# Patient Record
Sex: Female | Born: 1935 | Race: White | Hispanic: No | Marital: Married | Smoking: Never smoker
Health system: Southern US, Community
[De-identification: ages and names within clinical notes are randomized; demographics above are authoritative.]

## PROBLEM LIST (undated history)

## (undated) DIAGNOSIS — J189 Pneumonia, unspecified organism: Secondary | ICD-10-CM

## (undated) DIAGNOSIS — Z86718 Personal history of other venous thrombosis and embolism: Secondary | ICD-10-CM

## (undated) DIAGNOSIS — N390 Urinary tract infection, site not specified: Secondary | ICD-10-CM

## (undated) DIAGNOSIS — R569 Unspecified convulsions: Secondary | ICD-10-CM

## (undated) DIAGNOSIS — K222 Esophageal obstruction: Secondary | ICD-10-CM

## (undated) DIAGNOSIS — Z8719 Personal history of other diseases of the digestive system: Secondary | ICD-10-CM

## (undated) DIAGNOSIS — K635 Polyp of colon: Secondary | ICD-10-CM

## (undated) DIAGNOSIS — I10 Essential (primary) hypertension: Secondary | ICD-10-CM

## (undated) DIAGNOSIS — R51 Headache: Secondary | ICD-10-CM

## (undated) DIAGNOSIS — G309 Alzheimer's disease, unspecified: Secondary | ICD-10-CM

## (undated) DIAGNOSIS — D649 Anemia, unspecified: Secondary | ICD-10-CM

## (undated) DIAGNOSIS — M810 Age-related osteoporosis without current pathological fracture: Secondary | ICD-10-CM

## (undated) DIAGNOSIS — I2510 Atherosclerotic heart disease of native coronary artery without angina pectoris: Secondary | ICD-10-CM

## (undated) DIAGNOSIS — M1990 Unspecified osteoarthritis, unspecified site: Secondary | ICD-10-CM

## (undated) DIAGNOSIS — E7800 Pure hypercholesterolemia, unspecified: Secondary | ICD-10-CM

## (undated) DIAGNOSIS — K589 Irritable bowel syndrome without diarrhea: Secondary | ICD-10-CM

## (undated) DIAGNOSIS — K279 Peptic ulcer, site unspecified, unspecified as acute or chronic, without hemorrhage or perforation: Secondary | ICD-10-CM

## (undated) DIAGNOSIS — K5790 Diverticulosis of intestine, part unspecified, without perforation or abscess without bleeding: Secondary | ICD-10-CM

## (undated) DIAGNOSIS — T4145XA Adverse effect of unspecified anesthetic, initial encounter: Secondary | ICD-10-CM

## (undated) DIAGNOSIS — F329 Major depressive disorder, single episode, unspecified: Secondary | ICD-10-CM

## (undated) DIAGNOSIS — R42 Dizziness and giddiness: Secondary | ICD-10-CM

## (undated) DIAGNOSIS — I639 Cerebral infarction, unspecified: Secondary | ICD-10-CM

## (undated) DIAGNOSIS — K219 Gastro-esophageal reflux disease without esophagitis: Secondary | ICD-10-CM

## (undated) DIAGNOSIS — T8859XA Other complications of anesthesia, initial encounter: Secondary | ICD-10-CM

## (undated) DIAGNOSIS — F419 Anxiety disorder, unspecified: Secondary | ICD-10-CM

## (undated) DIAGNOSIS — F32A Depression, unspecified: Secondary | ICD-10-CM

## (undated) DIAGNOSIS — F0280 Dementia in other diseases classified elsewhere without behavioral disturbance: Secondary | ICD-10-CM

## (undated) DIAGNOSIS — G40909 Epilepsy, unspecified, not intractable, without status epilepticus: Secondary | ICD-10-CM

## (undated) DIAGNOSIS — C801 Malignant (primary) neoplasm, unspecified: Secondary | ICD-10-CM

## (undated) HISTORY — PX: ROTATOR CUFF REPAIR: SHX139

## (undated) HISTORY — PX: CHOLECYSTECTOMY: SHX55

## (undated) HISTORY — DX: Diverticulosis of intestine, part unspecified, without perforation or abscess without bleeding: K57.90

## (undated) HISTORY — DX: Gastro-esophageal reflux disease without esophagitis: K21.9

## (undated) HISTORY — DX: Dementia in other diseases classified elsewhere, unspecified severity, without behavioral disturbance, psychotic disturbance, mood disturbance, and anxiety: F02.80

## (undated) HISTORY — PX: EYE SURGERY: SHX253

## (undated) HISTORY — PX: DILATION AND CURETTAGE OF UTERUS: SHX78

## (undated) HISTORY — PX: CHOLECYSTECTOMY, LAPAROSCOPIC: SHX56

## (undated) HISTORY — DX: Alzheimer's disease, unspecified: G30.9

## (undated) HISTORY — DX: Dizziness and giddiness: R42

## (undated) HISTORY — PX: CARDIAC CATHETERIZATION: SHX172

## (undated) HISTORY — PX: BREAST LUMPECTOMY: SHX2

## (undated) HISTORY — DX: Polyp of colon: K63.5

## (undated) HISTORY — PX: BREAST SURGERY: SHX581

## (undated) HISTORY — DX: Esophageal obstruction: K22.2

## (undated) HISTORY — PX: NISSEN FUNDOPLICATION: SHX2091

## (undated) HISTORY — PX: COLONOSCOPY: SHX174

## (undated) HISTORY — PX: OTHER SURGICAL HISTORY: SHX169

## (undated) HISTORY — PX: HERNIA REPAIR: SHX51

---

## 1998-06-03 ENCOUNTER — Encounter (INDEPENDENT_AMBULATORY_CARE_PROVIDER_SITE_OTHER): Payer: Self-pay | Admitting: *Deleted

## 1998-06-03 ENCOUNTER — Ambulatory Visit (HOSPITAL_COMMUNITY): Admission: RE | Admit: 1998-06-03 | Discharge: 1998-06-03 | Payer: Self-pay | Admitting: Gastroenterology

## 1998-06-03 ENCOUNTER — Encounter: Payer: Self-pay | Admitting: Gastroenterology

## 1998-06-18 ENCOUNTER — Encounter: Payer: Self-pay | Admitting: Gastroenterology

## 1998-06-18 ENCOUNTER — Ambulatory Visit (HOSPITAL_COMMUNITY): Admission: RE | Admit: 1998-06-18 | Discharge: 1998-06-18 | Payer: Self-pay | Admitting: Gastroenterology

## 1998-06-18 ENCOUNTER — Encounter (INDEPENDENT_AMBULATORY_CARE_PROVIDER_SITE_OTHER): Payer: Self-pay | Admitting: *Deleted

## 1999-05-16 ENCOUNTER — Encounter: Admission: RE | Admit: 1999-05-16 | Discharge: 1999-05-16 | Payer: Self-pay | Admitting: Gastroenterology

## 1999-10-25 ENCOUNTER — Other Ambulatory Visit: Admission: RE | Admit: 1999-10-25 | Discharge: 1999-10-25 | Payer: Self-pay | Admitting: Obstetrics and Gynecology

## 1999-12-15 ENCOUNTER — Encounter: Admission: RE | Admit: 1999-12-15 | Discharge: 1999-12-15 | Payer: Self-pay | Admitting: General Surgery

## 1999-12-15 ENCOUNTER — Encounter: Payer: Self-pay | Admitting: General Surgery

## 1999-12-15 ENCOUNTER — Other Ambulatory Visit: Admission: RE | Admit: 1999-12-15 | Discharge: 1999-12-15 | Payer: Self-pay | Admitting: General Surgery

## 1999-12-15 ENCOUNTER — Encounter (INDEPENDENT_AMBULATORY_CARE_PROVIDER_SITE_OTHER): Payer: Self-pay | Admitting: *Deleted

## 2000-01-03 ENCOUNTER — Encounter: Payer: Self-pay | Admitting: General Surgery

## 2000-01-04 ENCOUNTER — Encounter: Payer: Self-pay | Admitting: General Surgery

## 2000-01-04 ENCOUNTER — Ambulatory Visit (HOSPITAL_COMMUNITY): Admission: RE | Admit: 2000-01-04 | Discharge: 2000-01-04 | Payer: Self-pay | Admitting: General Surgery

## 2000-01-04 ENCOUNTER — Encounter (INDEPENDENT_AMBULATORY_CARE_PROVIDER_SITE_OTHER): Payer: Self-pay | Admitting: *Deleted

## 2000-01-04 ENCOUNTER — Encounter (INDEPENDENT_AMBULATORY_CARE_PROVIDER_SITE_OTHER): Payer: Self-pay | Admitting: Specialist

## 2000-01-11 ENCOUNTER — Encounter: Admission: RE | Admit: 2000-01-11 | Discharge: 2000-04-10 | Payer: Self-pay | Admitting: Radiation Oncology

## 2000-01-20 ENCOUNTER — Encounter (INDEPENDENT_AMBULATORY_CARE_PROVIDER_SITE_OTHER): Payer: Self-pay | Admitting: *Deleted

## 2000-02-17 ENCOUNTER — Encounter (INDEPENDENT_AMBULATORY_CARE_PROVIDER_SITE_OTHER): Payer: Self-pay | Admitting: *Deleted

## 2000-02-22 ENCOUNTER — Ambulatory Visit (HOSPITAL_COMMUNITY): Admission: RE | Admit: 2000-02-22 | Discharge: 2000-02-22 | Payer: Self-pay | Admitting: Gastroenterology

## 2000-02-22 ENCOUNTER — Encounter: Payer: Self-pay | Admitting: Gastroenterology

## 2000-02-22 ENCOUNTER — Encounter (INDEPENDENT_AMBULATORY_CARE_PROVIDER_SITE_OTHER): Payer: Self-pay | Admitting: *Deleted

## 2000-03-15 ENCOUNTER — Other Ambulatory Visit: Admission: RE | Admit: 2000-03-15 | Discharge: 2000-03-15 | Payer: Self-pay | Admitting: Gastroenterology

## 2000-03-21 ENCOUNTER — Encounter: Admission: RE | Admit: 2000-03-21 | Discharge: 2000-06-19 | Payer: Self-pay | Admitting: Radiation Oncology

## 2000-05-07 ENCOUNTER — Encounter (INDEPENDENT_AMBULATORY_CARE_PROVIDER_SITE_OTHER): Payer: Self-pay | Admitting: *Deleted

## 2000-05-20 ENCOUNTER — Ambulatory Visit (HOSPITAL_COMMUNITY): Admission: RE | Admit: 2000-05-20 | Discharge: 2000-05-20 | Payer: Self-pay | Admitting: Neurology

## 2000-05-20 ENCOUNTER — Encounter: Payer: Self-pay | Admitting: Neurology

## 2000-07-25 ENCOUNTER — Encounter: Admission: RE | Admit: 2000-07-25 | Discharge: 2000-07-25 | Payer: Self-pay | Admitting: General Surgery

## 2000-07-25 ENCOUNTER — Encounter: Payer: Self-pay | Admitting: General Surgery

## 2001-02-18 ENCOUNTER — Encounter (INDEPENDENT_AMBULATORY_CARE_PROVIDER_SITE_OTHER): Payer: Self-pay | Admitting: *Deleted

## 2001-02-22 ENCOUNTER — Encounter: Payer: Self-pay | Admitting: Gastroenterology

## 2001-02-22 ENCOUNTER — Encounter (INDEPENDENT_AMBULATORY_CARE_PROVIDER_SITE_OTHER): Payer: Self-pay | Admitting: *Deleted

## 2001-02-22 ENCOUNTER — Encounter: Admission: RE | Admit: 2001-02-22 | Discharge: 2001-02-22 | Payer: Self-pay | Admitting: Gastroenterology

## 2001-03-08 ENCOUNTER — Encounter (INDEPENDENT_AMBULATORY_CARE_PROVIDER_SITE_OTHER): Payer: Self-pay | Admitting: *Deleted

## 2001-03-28 ENCOUNTER — Encounter: Payer: Self-pay | Admitting: Gastroenterology

## 2001-03-28 ENCOUNTER — Ambulatory Visit (HOSPITAL_COMMUNITY): Admission: RE | Admit: 2001-03-28 | Discharge: 2001-03-28 | Payer: Self-pay | Admitting: Gastroenterology

## 2001-03-28 ENCOUNTER — Encounter (INDEPENDENT_AMBULATORY_CARE_PROVIDER_SITE_OTHER): Payer: Self-pay | Admitting: *Deleted

## 2001-05-02 ENCOUNTER — Encounter: Payer: Self-pay | Admitting: Oncology

## 2001-05-02 ENCOUNTER — Encounter: Admission: RE | Admit: 2001-05-02 | Discharge: 2001-05-02 | Payer: Self-pay | Admitting: Oncology

## 2001-05-17 ENCOUNTER — Encounter: Payer: Self-pay | Admitting: Oncology

## 2001-05-17 ENCOUNTER — Ambulatory Visit (HOSPITAL_COMMUNITY): Admission: RE | Admit: 2001-05-17 | Discharge: 2001-05-17 | Payer: Self-pay | Admitting: Oncology

## 2001-05-21 ENCOUNTER — Encounter: Admission: RE | Admit: 2001-05-21 | Discharge: 2001-05-21 | Payer: Self-pay | Admitting: Surgery

## 2001-05-21 ENCOUNTER — Encounter: Payer: Self-pay | Admitting: General Surgery

## 2001-06-11 ENCOUNTER — Ambulatory Visit (HOSPITAL_COMMUNITY): Admission: RE | Admit: 2001-06-11 | Discharge: 2001-06-11 | Payer: Self-pay | Admitting: Gastroenterology

## 2001-06-13 ENCOUNTER — Encounter (INDEPENDENT_AMBULATORY_CARE_PROVIDER_SITE_OTHER): Payer: Self-pay | Admitting: *Deleted

## 2001-07-26 ENCOUNTER — Encounter: Payer: Self-pay | Admitting: Surgery

## 2001-08-02 ENCOUNTER — Inpatient Hospital Stay (HOSPITAL_COMMUNITY): Admission: RE | Admit: 2001-08-02 | Discharge: 2001-08-04 | Payer: Self-pay | Admitting: Surgery

## 2001-08-02 ENCOUNTER — Encounter (INDEPENDENT_AMBULATORY_CARE_PROVIDER_SITE_OTHER): Payer: Self-pay | Admitting: *Deleted

## 2001-08-03 ENCOUNTER — Encounter: Payer: Self-pay | Admitting: Surgery

## 2001-08-03 ENCOUNTER — Encounter (INDEPENDENT_AMBULATORY_CARE_PROVIDER_SITE_OTHER): Payer: Self-pay | Admitting: *Deleted

## 2002-01-28 ENCOUNTER — Other Ambulatory Visit: Admission: RE | Admit: 2002-01-28 | Discharge: 2002-01-28 | Payer: Self-pay | Admitting: Obstetrics & Gynecology

## 2002-05-05 ENCOUNTER — Encounter: Admission: RE | Admit: 2002-05-05 | Discharge: 2002-05-05 | Payer: Self-pay | Admitting: Oncology

## 2002-05-05 ENCOUNTER — Encounter: Payer: Self-pay | Admitting: Oncology

## 2002-07-18 ENCOUNTER — Encounter (INDEPENDENT_AMBULATORY_CARE_PROVIDER_SITE_OTHER): Payer: Self-pay | Admitting: *Deleted

## 2002-09-23 ENCOUNTER — Encounter: Payer: Self-pay | Admitting: Internal Medicine

## 2002-09-23 ENCOUNTER — Encounter: Admission: RE | Admit: 2002-09-23 | Discharge: 2002-09-23 | Payer: Self-pay | Admitting: Internal Medicine

## 2002-09-23 ENCOUNTER — Encounter (INDEPENDENT_AMBULATORY_CARE_PROVIDER_SITE_OTHER): Payer: Self-pay | Admitting: *Deleted

## 2002-09-30 ENCOUNTER — Encounter: Admission: RE | Admit: 2002-09-30 | Discharge: 2002-10-10 | Payer: Self-pay | Admitting: Internal Medicine

## 2002-10-02 ENCOUNTER — Encounter: Payer: Self-pay | Admitting: Internal Medicine

## 2002-10-02 ENCOUNTER — Encounter: Admission: RE | Admit: 2002-10-02 | Discharge: 2002-10-02 | Payer: Self-pay | Admitting: Internal Medicine

## 2003-05-07 ENCOUNTER — Encounter: Admission: RE | Admit: 2003-05-07 | Discharge: 2003-05-07 | Payer: Self-pay | Admitting: Oncology

## 2003-05-07 ENCOUNTER — Encounter: Payer: Self-pay | Admitting: Oncology

## 2004-02-22 ENCOUNTER — Ambulatory Visit (HOSPITAL_COMMUNITY): Admission: RE | Admit: 2004-02-22 | Discharge: 2004-02-22 | Payer: Self-pay | Admitting: Neurology

## 2004-05-10 ENCOUNTER — Encounter: Admission: RE | Admit: 2004-05-10 | Discharge: 2004-05-10 | Payer: Self-pay | Admitting: Oncology

## 2004-06-08 ENCOUNTER — Ambulatory Visit: Payer: Self-pay | Admitting: Gastroenterology

## 2004-08-19 ENCOUNTER — Ambulatory Visit: Payer: Self-pay | Admitting: Oncology

## 2004-09-09 ENCOUNTER — Encounter: Admission: RE | Admit: 2004-09-09 | Discharge: 2004-09-09 | Payer: Self-pay | Admitting: Internal Medicine

## 2004-11-13 ENCOUNTER — Encounter: Admission: RE | Admit: 2004-11-13 | Discharge: 2004-11-13 | Payer: Self-pay | Admitting: Specialist

## 2004-12-01 ENCOUNTER — Encounter: Admission: RE | Admit: 2004-12-01 | Discharge: 2004-12-01 | Payer: Self-pay | Admitting: Specialist

## 2004-12-15 ENCOUNTER — Inpatient Hospital Stay (HOSPITAL_COMMUNITY): Admission: RE | Admit: 2004-12-15 | Discharge: 2004-12-16 | Payer: Self-pay | Admitting: Specialist

## 2005-03-14 ENCOUNTER — Ambulatory Visit: Payer: Self-pay | Admitting: Oncology

## 2005-03-20 ENCOUNTER — Ambulatory Visit (HOSPITAL_COMMUNITY): Admission: RE | Admit: 2005-03-20 | Discharge: 2005-03-20 | Payer: Self-pay | Admitting: Neurology

## 2005-03-29 ENCOUNTER — Ambulatory Visit (HOSPITAL_BASED_OUTPATIENT_CLINIC_OR_DEPARTMENT_OTHER): Admission: RE | Admit: 2005-03-29 | Discharge: 2005-03-29 | Payer: Self-pay | Admitting: Surgery

## 2005-03-29 ENCOUNTER — Encounter (INDEPENDENT_AMBULATORY_CARE_PROVIDER_SITE_OTHER): Payer: Self-pay | Admitting: *Deleted

## 2005-05-01 ENCOUNTER — Ambulatory Visit: Payer: Self-pay | Admitting: Oncology

## 2005-06-06 ENCOUNTER — Encounter: Admission: RE | Admit: 2005-06-06 | Discharge: 2005-06-06 | Payer: Self-pay | Admitting: Neurology

## 2005-09-12 ENCOUNTER — Encounter: Admission: RE | Admit: 2005-09-12 | Discharge: 2005-09-12 | Payer: Self-pay | Admitting: Oncology

## 2005-09-27 ENCOUNTER — Ambulatory Visit: Payer: Self-pay | Admitting: Gastroenterology

## 2005-10-10 ENCOUNTER — Ambulatory Visit: Payer: Self-pay | Admitting: Gastroenterology

## 2005-10-10 ENCOUNTER — Encounter (INDEPENDENT_AMBULATORY_CARE_PROVIDER_SITE_OTHER): Payer: Self-pay | Admitting: Gastroenterology

## 2006-09-25 ENCOUNTER — Encounter: Admission: RE | Admit: 2006-09-25 | Discharge: 2006-09-25 | Payer: Self-pay | Admitting: General Surgery

## 2006-12-21 ENCOUNTER — Ambulatory Visit: Payer: Self-pay | Admitting: Oncology

## 2007-01-25 LAB — COMPREHENSIVE METABOLIC PANEL
ALT: 14 U/L (ref 0–35)
AST: 15 U/L (ref 0–37)
Alkaline Phosphatase: 102 U/L (ref 39–117)
BUN: 18 mg/dL (ref 6–23)
Creatinine, Ser: 0.9 mg/dL (ref 0.40–1.20)
Potassium: 4.4 mEq/L (ref 3.5–5.3)

## 2007-01-25 LAB — CBC WITH DIFFERENTIAL/PLATELET
BASO%: 0.4 % (ref 0.0–2.0)
Basophils Absolute: 0 10*3/uL (ref 0.0–0.1)
EOS%: 3.5 % (ref 0.0–7.0)
HGB: 11.2 g/dL — ABNORMAL LOW (ref 11.6–15.9)
MCH: 31.9 pg (ref 26.0–34.0)
MCHC: 35.2 g/dL (ref 32.0–36.0)
MCV: 90.7 fL (ref 81.0–101.0)
MONO%: 8.5 % (ref 0.0–13.0)
NEUT%: 51.4 % (ref 39.6–76.8)
RDW: 13.1 % (ref 11.3–14.5)

## 2007-02-05 ENCOUNTER — Encounter: Admission: RE | Admit: 2007-02-05 | Discharge: 2007-02-05 | Payer: Self-pay | Admitting: Neurology

## 2007-02-18 ENCOUNTER — Ambulatory Visit: Payer: Self-pay | Admitting: Internal Medicine

## 2007-03-04 ENCOUNTER — Ambulatory Visit: Payer: Self-pay | Admitting: Internal Medicine

## 2007-04-16 ENCOUNTER — Inpatient Hospital Stay (HOSPITAL_COMMUNITY): Admission: AD | Admit: 2007-04-16 | Discharge: 2007-04-19 | Payer: Self-pay | Admitting: Internal Medicine

## 2007-04-16 ENCOUNTER — Encounter (INDEPENDENT_AMBULATORY_CARE_PROVIDER_SITE_OTHER): Payer: Self-pay | Admitting: *Deleted

## 2007-04-19 ENCOUNTER — Encounter (INDEPENDENT_AMBULATORY_CARE_PROVIDER_SITE_OTHER): Payer: Self-pay | Admitting: *Deleted

## 2007-04-23 ENCOUNTER — Encounter (INDEPENDENT_AMBULATORY_CARE_PROVIDER_SITE_OTHER): Payer: Self-pay | Admitting: *Deleted

## 2007-05-08 ENCOUNTER — Inpatient Hospital Stay (HOSPITAL_COMMUNITY): Admission: AD | Admit: 2007-05-08 | Discharge: 2007-05-12 | Payer: Self-pay | Admitting: Internal Medicine

## 2007-05-10 ENCOUNTER — Encounter (INDEPENDENT_AMBULATORY_CARE_PROVIDER_SITE_OTHER): Payer: Self-pay | Admitting: *Deleted

## 2007-05-10 ENCOUNTER — Ambulatory Visit: Payer: Self-pay | Admitting: Vascular Surgery

## 2007-05-11 ENCOUNTER — Encounter (INDEPENDENT_AMBULATORY_CARE_PROVIDER_SITE_OTHER): Payer: Self-pay | Admitting: *Deleted

## 2007-05-12 ENCOUNTER — Encounter (INDEPENDENT_AMBULATORY_CARE_PROVIDER_SITE_OTHER): Payer: Self-pay | Admitting: *Deleted

## 2007-05-18 ENCOUNTER — Emergency Department (HOSPITAL_COMMUNITY): Admission: EM | Admit: 2007-05-18 | Discharge: 2007-05-18 | Payer: Self-pay | Admitting: Emergency Medicine

## 2007-05-30 ENCOUNTER — Ambulatory Visit: Payer: Self-pay | Admitting: Internal Medicine

## 2007-09-27 ENCOUNTER — Encounter: Admission: RE | Admit: 2007-09-27 | Discharge: 2007-09-27 | Payer: Self-pay | Admitting: Oncology

## 2007-10-03 DIAGNOSIS — C50919 Malignant neoplasm of unspecified site of unspecified female breast: Secondary | ICD-10-CM | POA: Insufficient documentation

## 2007-10-03 DIAGNOSIS — K219 Gastro-esophageal reflux disease without esophagitis: Secondary | ICD-10-CM

## 2007-10-03 DIAGNOSIS — F411 Generalized anxiety disorder: Secondary | ICD-10-CM | POA: Insufficient documentation

## 2007-10-03 DIAGNOSIS — K319 Disease of stomach and duodenum, unspecified: Secondary | ICD-10-CM

## 2007-10-03 DIAGNOSIS — I10 Essential (primary) hypertension: Secondary | ICD-10-CM | POA: Insufficient documentation

## 2007-10-03 DIAGNOSIS — M1990 Unspecified osteoarthritis, unspecified site: Secondary | ICD-10-CM | POA: Insufficient documentation

## 2007-10-03 DIAGNOSIS — K5730 Diverticulosis of large intestine without perforation or abscess without bleeding: Secondary | ICD-10-CM | POA: Insufficient documentation

## 2007-10-03 DIAGNOSIS — K222 Esophageal obstruction: Secondary | ICD-10-CM

## 2007-10-03 DIAGNOSIS — D126 Benign neoplasm of colon, unspecified: Secondary | ICD-10-CM

## 2007-10-03 DIAGNOSIS — K449 Diaphragmatic hernia without obstruction or gangrene: Secondary | ICD-10-CM | POA: Insufficient documentation

## 2007-10-03 DIAGNOSIS — E785 Hyperlipidemia, unspecified: Secondary | ICD-10-CM

## 2007-11-06 ENCOUNTER — Ambulatory Visit (HOSPITAL_COMMUNITY): Admission: RE | Admit: 2007-11-06 | Discharge: 2007-11-06 | Payer: Self-pay | Admitting: Internal Medicine

## 2008-01-22 ENCOUNTER — Ambulatory Visit: Payer: Self-pay | Admitting: Oncology

## 2008-01-28 ENCOUNTER — Encounter: Payer: Self-pay | Admitting: Internal Medicine

## 2008-01-28 LAB — COMPREHENSIVE METABOLIC PANEL
ALT: 21 U/L (ref 0–35)
Albumin: 4.4 g/dL (ref 3.5–5.2)
CO2: 25 mEq/L (ref 19–32)
Chloride: 107 mEq/L (ref 96–112)
Glucose, Bld: 96 mg/dL (ref 70–99)
Potassium: 4.1 mEq/L (ref 3.5–5.3)
Sodium: 143 mEq/L (ref 135–145)
Total Bilirubin: 0.2 mg/dL — ABNORMAL LOW (ref 0.3–1.2)
Total Protein: 7.4 g/dL (ref 6.0–8.3)

## 2008-01-28 LAB — CBC WITH DIFFERENTIAL/PLATELET
BASO%: 0.7 % (ref 0.0–2.0)
EOS%: 2.9 % (ref 0.0–7.0)
HCT: 33.4 % — ABNORMAL LOW (ref 34.8–46.6)
LYMPH%: 40.4 % (ref 14.0–48.0)
MCH: 32.3 pg (ref 26.0–34.0)
MCHC: 34.7 g/dL (ref 32.0–36.0)
MCV: 93.1 fL (ref 81.0–101.0)
MONO%: 7.5 % (ref 0.0–13.0)
NEUT%: 48.5 % (ref 39.6–76.8)
Platelets: 332 10*3/uL (ref 145–400)
RBC: 3.58 10*6/uL — ABNORMAL LOW (ref 3.70–5.32)

## 2008-01-31 LAB — LAMOTRIGINE LEVEL: Lamotrigine Lvl: 0.8 ug/mL — ABNORMAL LOW (ref 4.0–18.0)

## 2008-01-31 LAB — PHENYTOIN LEVEL, TOTAL: Phenytoin Lvl: 6.9 ug/mL — ABNORMAL LOW (ref 10.0–20.0)

## 2008-09-30 ENCOUNTER — Encounter: Admission: RE | Admit: 2008-09-30 | Discharge: 2008-09-30 | Payer: Self-pay | Admitting: Oncology

## 2008-12-08 ENCOUNTER — Emergency Department (HOSPITAL_COMMUNITY): Admission: EM | Admit: 2008-12-08 | Discharge: 2008-12-08 | Payer: Self-pay | Admitting: Emergency Medicine

## 2008-12-08 ENCOUNTER — Encounter (INDEPENDENT_AMBULATORY_CARE_PROVIDER_SITE_OTHER): Payer: Self-pay | Admitting: *Deleted

## 2009-01-22 ENCOUNTER — Ambulatory Visit: Payer: Self-pay | Admitting: Oncology

## 2009-01-26 ENCOUNTER — Ambulatory Visit (HOSPITAL_COMMUNITY): Admission: RE | Admit: 2009-01-26 | Discharge: 2009-01-26 | Payer: Self-pay | Admitting: Internal Medicine

## 2009-01-26 ENCOUNTER — Encounter: Payer: Self-pay | Admitting: Internal Medicine

## 2009-01-26 LAB — COMPREHENSIVE METABOLIC PANEL
ALT: 12 U/L (ref 0–35)
AST: 14 U/L (ref 0–37)
Albumin: 4.2 g/dL (ref 3.5–5.2)
CO2: 23 mEq/L (ref 19–32)
Calcium: 9 mg/dL (ref 8.4–10.5)
Chloride: 105 mEq/L (ref 96–112)
Creatinine, Ser: 1.01 mg/dL (ref 0.40–1.20)
Potassium: 4 mEq/L (ref 3.5–5.3)
Sodium: 139 mEq/L (ref 135–145)
Total Protein: 7.2 g/dL (ref 6.0–8.3)

## 2009-01-26 LAB — CBC WITH DIFFERENTIAL/PLATELET
BASO%: 0.3 % (ref 0.0–2.0)
EOS%: 2.7 % (ref 0.0–7.0)
HCT: 30 % — ABNORMAL LOW (ref 34.8–46.6)
MCH: 33.2 pg (ref 25.1–34.0)
MCHC: 35.2 g/dL (ref 31.5–36.0)
MONO#: 0.4 10*3/uL (ref 0.1–0.9)
RBC: 3.18 10*6/uL — ABNORMAL LOW (ref 3.70–5.45)
RDW: 13.3 % (ref 11.2–14.5)
WBC: 6.1 10*3/uL (ref 3.9–10.3)
lymph#: 1.8 10*3/uL (ref 0.9–3.3)

## 2009-01-26 LAB — RETICULOCYTES
IRF: 0.3 (ref 0.130–0.330)
RBC: 3.17 10*6/uL — ABNORMAL LOW (ref 3.70–5.45)

## 2009-01-26 LAB — LACTATE DEHYDROGENASE: LDH: 131 U/L (ref 94–250)

## 2009-01-26 LAB — FERRITIN: Ferritin: 30 ng/mL (ref 10–291)

## 2009-04-22 ENCOUNTER — Ambulatory Visit: Payer: Self-pay | Admitting: Oncology

## 2009-04-26 LAB — CBC WITH DIFFERENTIAL/PLATELET
Basophils Absolute: 0 10*3/uL (ref 0.0–0.1)
Eosinophils Absolute: 0.2 10*3/uL (ref 0.0–0.5)
HGB: 10.9 g/dL — ABNORMAL LOW (ref 11.6–15.9)
MCV: 93.3 fL (ref 79.5–101.0)
MONO#: 0.4 10*3/uL (ref 0.1–0.9)
MONO%: 7.2 % (ref 0.0–14.0)
NEUT#: 2.6 10*3/uL (ref 1.5–6.5)
Platelets: 295 10*3/uL (ref 145–400)
RBC: 3.36 10*6/uL — ABNORMAL LOW (ref 3.70–5.45)
RDW: 13.1 % (ref 11.2–14.5)
WBC: 5 10*3/uL (ref 3.9–10.3)

## 2009-04-26 LAB — MORPHOLOGY: RBC Comments: NORMAL

## 2009-04-29 LAB — PHENYTOIN LEVEL, TOTAL: Phenytoin Lvl: 7 ug/mL — ABNORMAL LOW (ref 10.0–20.0)

## 2009-04-29 LAB — LAMOTRIGINE LEVEL: Lamotrigine Lvl: 0.8 ug/mL — ABNORMAL LOW (ref 4.0–18.0)

## 2009-10-04 ENCOUNTER — Encounter: Admission: RE | Admit: 2009-10-04 | Discharge: 2009-10-04 | Payer: Self-pay | Admitting: Oncology

## 2009-12-30 ENCOUNTER — Encounter: Admission: RE | Admit: 2009-12-30 | Discharge: 2009-12-30 | Payer: Self-pay | Admitting: Neurology

## 2010-02-24 ENCOUNTER — Ambulatory Visit: Payer: Self-pay | Admitting: Internal Medicine

## 2010-02-24 DIAGNOSIS — D649 Anemia, unspecified: Secondary | ICD-10-CM

## 2010-02-24 DIAGNOSIS — D638 Anemia in other chronic diseases classified elsewhere: Secondary | ICD-10-CM

## 2010-02-24 DIAGNOSIS — R195 Other fecal abnormalities: Secondary | ICD-10-CM

## 2010-02-24 DIAGNOSIS — R1319 Other dysphagia: Secondary | ICD-10-CM

## 2010-02-24 DIAGNOSIS — Z860100 Personal history of colon polyps, unspecified: Secondary | ICD-10-CM | POA: Insufficient documentation

## 2010-04-05 ENCOUNTER — Ambulatory Visit: Payer: Self-pay | Admitting: Internal Medicine

## 2010-04-05 DIAGNOSIS — K219 Gastro-esophageal reflux disease without esophagitis: Secondary | ICD-10-CM

## 2010-04-05 DIAGNOSIS — K635 Polyp of colon: Secondary | ICD-10-CM

## 2010-04-05 HISTORY — DX: Polyp of colon: K63.5

## 2010-04-05 HISTORY — DX: Gastro-esophageal reflux disease without esophagitis: K21.9

## 2010-04-07 ENCOUNTER — Encounter: Payer: Self-pay | Admitting: Internal Medicine

## 2010-04-13 ENCOUNTER — Telehealth (INDEPENDENT_AMBULATORY_CARE_PROVIDER_SITE_OTHER): Payer: Self-pay | Admitting: *Deleted

## 2010-04-21 ENCOUNTER — Ambulatory Visit: Payer: Self-pay | Admitting: Oncology

## 2010-04-26 ENCOUNTER — Encounter: Payer: Self-pay | Admitting: Internal Medicine

## 2010-04-26 LAB — CBC WITH DIFFERENTIAL/PLATELET
BASO%: 0.5 % (ref 0.0–2.0)
EOS%: 2.4 % (ref 0.0–7.0)
MCHC: 33.8 g/dL (ref 31.5–36.0)
MONO#: 0.3 10*3/uL (ref 0.1–0.9)
RBC: 3.4 10*6/uL — ABNORMAL LOW (ref 3.70–5.45)
WBC: 5.3 10*3/uL (ref 3.9–10.3)
lymph#: 1.8 10*3/uL (ref 0.9–3.3)

## 2010-04-26 LAB — COMPREHENSIVE METABOLIC PANEL
ALT: 16 U/L (ref 0–35)
AST: 17 U/L (ref 0–37)
CO2: 28 mEq/L (ref 19–32)
Calcium: 9.1 mg/dL (ref 8.4–10.5)
Chloride: 106 mEq/L (ref 96–112)
Sodium: 143 mEq/L (ref 135–145)
Total Bilirubin: 0.2 mg/dL — ABNORMAL LOW (ref 0.3–1.2)
Total Protein: 7.2 g/dL (ref 6.0–8.3)

## 2010-04-30 LAB — PHENYTOIN LEVEL, TOTAL: Phenytoin Lvl: 6.1 ug/mL — ABNORMAL LOW (ref 10.0–20.0)

## 2010-07-26 ENCOUNTER — Ambulatory Visit (HOSPITAL_COMMUNITY)
Admission: RE | Admit: 2010-07-26 | Discharge: 2010-07-26 | Payer: Self-pay | Source: Home / Self Care | Admitting: Internal Medicine

## 2010-08-03 ENCOUNTER — Inpatient Hospital Stay (HOSPITAL_COMMUNITY)
Admission: EM | Admit: 2010-08-03 | Discharge: 2010-08-05 | Payer: Self-pay | Source: Home / Self Care | Attending: Cardiovascular Disease | Admitting: Cardiovascular Disease

## 2010-08-04 ENCOUNTER — Encounter (INDEPENDENT_AMBULATORY_CARE_PROVIDER_SITE_OTHER): Payer: Self-pay | Admitting: Cardiovascular Disease

## 2010-09-08 NOTE — Op Note (Signed)
Summary: Operative Report-Dr. Laretta Bolster [facility]  Patient:    [name], [name] Visit Number: [id number] MRN: [id number]          Service Type: SUR Location: 3W [id number] 01 Attending Physician:  Katha Cabal Dictated by:   Thornton Park Daphine Deutscher, M.D. Proc. Date: 08/02/01 Admit Date:  08/02/2001   CC:         Corwin Levins, M.D. [facility]  Ulyess Mort, M.D. [facility]   Operative Report  CCS NUMBER:  [phone]  PREOPERATIVE DIAGNOSIS:  Longstanding gastroesophageal reflux disease with stricture and hiatal hernia.  POSTOPERATIVE DIAGNOSIS:  Longstanding gastroesophageal reflux disease with stricture and hiatal hernia.  PROCEDURE:  Laparoscopic Nissen fundoplication (three) with closure of the hiatal hernia (four posterior).  SURGEON:  Thornton Park. Daphine Deutscher, M.D.  ASSISTANT:  Ollen Gross. Vernell Morgans, M.D.  ANESTHESIA:  General endotracheal.  DESCRIPTION OF PROCEDURE:  The patient is a 76 year old lady who was taken to room #1 and given general anesthesia.  She is latex sensitive, so non-latex materials were used throughout the procedure.  After prepping with Betadine and draping sterilely, I made a longitudinal incision above the umbilicus, and through a pursestring suture inserted the Hasson cannula.  The abdomen was insufflated.  A 5 mm was placed in the upper midline through which the liver retractor was placed.  Two 10-11s were placed on the right side and one in the left upper quadrant.  The dissection began by taking down the gastrohepatic window and exposing the right crus.  I dissected up the right crus anteriorly.  I did take a picture of the hiatal hernia that was present which appeared significant.  I took this down as far as I could on the left side.  Realizing there was a fairly sizeable hiatal hernia, I went ahead then and began working up the greater curvature by taking down the short gastrics.  This I did with the harmonic scalpel.  There  was one bleeder on the stomach that I _______ control with the clip.  Otherwise, we marched our way up the stomach to the left crus. There, I dissected the left crus, and posteriorly found a lot of chronic membranous adhesions of the esophagus to the surrounding tissue suggesting longstanding reflux changes.  This was mobilized.  I then worked on the right side, and identified the vagus nerve which I dissected free.  It had a tendency to want to traverse a little more posteriorly, but I dissected it free and placed it anteriorly, and then worked beneath it.  I then had good visualization of the right and left crus or crura.  I then approximated those with four interrupted sutures using the Endostitch tied extracorporeally with multiple knots.  This approximately the crura nicely without being too tight and without actually cutting the muscle, just mainly good firm approximation was present.  Visually, the closure seemed to be adequate, and did not appear too snug.  Because the patient had recently had to undergo in hospital dilatation because we did not have any lighted bougies present, I elected not to pass any of the dilators that we presently have because of the degree of scarring and inflammation that was there, both on the endoscopic exam and what I was seeing visually.  The top of the cardia had been completely freed and was very mobile.  After  closing the hiatus, I reached around behind and grasped the stomach, and pulled it over where it stayed easily.  I then invaginated the distal esophagus with the stomach by using the shoe shine maneuver to ensure that I had contiguous portion of the stomach, and then suturing that wrap with three sutures using the Endostitch and intracorporeal knot.  These were tied and clips were placed on these.  Essentially no bleeding was present.  The wrap looked healthy.  Trocar sites were all injected with 0.5% Marcaine.  The umbilical wound was tied  down under direct vision and was secured.  The other ports were then withdrawn and the abdomen was deflated.  The patient seemed to tolerate the procedure well and was taken to the recovery room in satisfactory condition. Dictated by:   Thornton Park Daphine Deutscher, M.D. Attending Physician:  Katha Cabal DD:  08/02/01 TD:  08/03/01 Job: [id number]

## 2010-09-08 NOTE — Progress Notes (Signed)
Summary: [facility]   Imported By: Sherian Rein 02/25/2010 13:31:02  _____________________________________________________________________  External Attachment:    Type:   Image     Comment:   External Document

## 2010-09-08 NOTE — Procedures (Signed)
Summary: Colonoscopy   Colonoscopy  Procedure date:  07/18/2002  Findings:      Location:  [facility].  Results: Polyp. Tubular Adenoma   Procedures Next Due Date:    Colonoscopy: 07/2004 Patient Name: [name], [name] MRN:  Procedure Procedures: Colonoscopy CPT: [id number].    with Hot Biopsy(s)CPT: [id number].    with polypectomy. CPT: [id number].  Personnel: Endoscopist: Ulyess Mort, MD.  Referred By: Corwin Levins, MD.  Exam Location: Exam performed in Outpatient Clinic. Outpatient  Patient Consent: Procedure, Alternatives, Risks and Benefits discussed, consent obtained, from patient. Consent was obtained by the RN.  Indications  Surveillance of: Adenomatous Polyp(s).  History  Pre-Exam Physical: Performed Jul 18, 2002. Rectal exam, Abdominal exam, Extremity exam, Mental status exam WNL.  Exam Exam: Extent of exam reached: Cecum, extent intended: Cecum.  Colon retroflexion performed. Images were not taken. ASA Classification: II. Tolerance: good.  Monitoring: Pulse and BP monitoring, Oximetry used. Supplemental O2 given.  Colon Prep Prep results: good.  Sedation Meds: Patient assessed and found to be appropriate for moderate (conscious) sedation. Fentanyl 100 mcg. given IV. Versed 7 mg. given IV.  Findings - POLYP: Ascending Colon, Maximum size: 12 mm. sessile polyp. Procedure:  hot biopsy, removed, retrieved, Polyp sent to pathology. ICD9: Colon Polyps: 211.3.   Assessment Abnormal examination, see findings above.  Diagnoses: 211.3: Colon Polyps.   Events  Unplanned Interventions: No intervention was required.  Unplanned Events: There were no complications. Plans Medication Plan: Await pathology. Continue current medications.  Patient Education: Patient given standard instructions for: Polyps. Yearly hemoccult testing recommended. Patient instructed to get routine colonoscopy every 2 years.  Disposition: After procedure patient  sent to recovery. After recovery patient sent home.   This report was created from the original endoscopy report, which was reviewed and signed by the above listed endoscopist.   cc:  Oliver Barre, MD

## 2010-09-08 NOTE — Letter (Signed)
Summary: [facility] Instructions  [facility]  [address] [place], [place] [address]   Phone: [phone]  Fax: [phone]       [name]    20-Dec-1934    MRN: [id number]        Procedure Day /Date:TUESDAY, 04/05/10     Arrival Time:1:30 PM     Procedure Time:2:30 PM     Location of Procedure:                    X  [facility] (4th Floor)  PREPARATION FOR COLONOSCOPY WITH MOVIPREP/ENDO   Starting 5 days prior to your procedure 03/31/10 do not eat nuts, seeds, popcorn, corn, beans, peas,  salads, or any raw vegetables.  Do not take any fiber supplements (e.g. Metamucil, Citrucel, and Benefiber).  THE DAY BEFORE YOUR PROCEDURE         DATE: 04/04/10 DAY: MONDAY  1.  Drink clear liquids the entire day-NO SOLID FOOD  2.  Do not drink anything colored red or purple.  Avoid juices with pulp.  No orange juice.  3.  Drink at least 64 oz. (8 glasses) of fluid/clear liquids during the day to prevent dehydration and help the prep work efficiently.  CLEAR LIQUIDS INCLUDE: Water Jello Ice Popsicles Tea (sugar ok, no milk/cream) Powdered fruit flavored drinks Coffee (sugar ok, no milk/cream) Gatorade Juice: apple, white grape, white cranberry  Lemonade Clear bullion, consomm, broth Carbonated beverages (any kind) Strained chicken noodle soup Hard Candy                             4.  In the morning, mix first dose of MoviPrep solution:    Empty 1 Pouch A and 1 Pouch B into the disposable container    Add lukewarm drinking water to the top line of the container. Mix to dissolve    Refrigerate (mixed solution should be used within 24 hrs)  5.  Begin drinking the prep at 5:00 p.m. The MoviPrep container is divided by 4 marks.   Every 15 minutes drink the solution down to the next mark (approximately 8 oz) until the full liter is complete.   6.  Follow completed prep with 16 oz of clear liquid of your choice (Nothing red or purple).  Continue  to drink clear liquids until bedtime.  7.  Before going to bed, mix second dose of MoviPrep solution:    Empty 1 Pouch A and 1 Pouch B into the disposable container    Add lukewarm drinking water to the top line of the container. Mix to dissolve    Refrigerate  THE DAY OF YOUR PROCEDURE      DATE: 8/30/11DAY:TUESDAY  Beginning at 9:30 a.m. (5 hours before procedure):         1. Every 15 minutes, drink the solution down to the next mark (approx 8 oz) until the full liter is complete.  2. Follow completed prep with 16 oz. of clear liquid of your choice.    3. You may drink clear liquids until 12:30 PM (2 HOURS BEFORE PROCEDURE).   MEDICATION INSTRUCTIONS  Unless otherwise instructed, you should take regular prescription medications with a small sip of water   as early as possible the morning of your procedure.         OTHER INSTRUCTIONS  You will need a responsible adult at least 74 years of age to accompany you and  drive you home.   This person must remain in the waiting room during your procedure.  Wear loose fitting clothing that is easily removed.  Leave jewelry and other valuables at home.  However, you may wish to bring a book to read or  an iPod/MP3 player to listen to music as you wait for your procedure to start.  Remove all body piercing jewelry and leave at home.  Total time from sign-in until discharge is approximately 2-3 hours.  You should go home directly after your procedure and rest.  You can resume normal activities the  day after your procedure.  The day of your procedure you should not:   Drive   Make legal decisions   Operate machinery   Drink alcohol   Return to work  You will receive specific instructions about eating, activities and medications before you leave.    The above instructions have been reviewed and explained to me by   _______________________    I fully understand and can verbalize these instructions  _____________________________ Date _________

## 2010-09-08 NOTE — Progress Notes (Signed)
Summary: [facility]   Imported By: Sherian Rein 02/25/2010 13:32:06  _____________________________________________________________________  External Attachment:    Type:   Image     Comment:   External Document

## 2010-09-08 NOTE — Initial Assessments (Signed)
Summary: Consultation  [name], [name] NO.:  [id number]      MEDICAL RECORD NO.:  [id number]          PATIENT TYPE:  INP      LOCATION:  5012                         FACILITY:  [facility]      PHYSICIAN:  Sharlet Salina T. Hoxworth, M.D.DATE OF BIRTH:  06-Mar-1935      DATE OF CONSULTATION:  04/16/2007   DATE OF DISCHARGE:                                    CONSULTATION      CHIEF COMPLAINT:  Abdominal pain and nausea.      HISTORY OF PRESENT ILLNESS:  I was asked by Dr. Clelia Croft to evaluate Ms.   [name].  She is a pleasant 76 year old white female who about 2 weeks   ago had the gradual onset of lower abdominal pain.  She first noticed   this when getting up in the morning when her feet hit the floor with   jarring.  There was pain.  This became quite a bit worse over the next   couple of days, with fairly constant pain in her left lower quadrant   that was associated mainly with moving.  She had some nausea and some   subjective fever at that time as well.  These symptoms persisted for   about a week, and then she saw Dr. Clelia Croft as an outpatient 1 week ago.  At   that time, she was started on oral Cipro and Flagyl for presumed   diverticulitis.  The patient states she initially felt somewhat improved   on the antibiotics, although the pain did not resolve.  However, over   the last 3-4 days she has again experienced increasing pain across her   lower abdomen, worse on the left than the right.  This is associated   again with moving.  She has had some continued nausea and rare vomiting   as well.  Her bowel movements have been loose and urgent, but this has   been a problem for her over a number of months.  She has been evaluated   by GI at [facility] by Dr. Marina Goodell in July 2008 and felt to have IBS.  She   describes constant pain worse with motion across her lower abdomen,   greater on the left side.  She has a history of previous episodes of   diverticulitis diagnosed  clinically, treated as an outpatient with oral   antibiotics on at least 3-4 occasions over the last several years.  She   has had colonoscopy in the past, most recently about a year ago by Dr.   Corinda Gubler, with findings of diverticulosis and diminutive polyps.  She has   a history of colon polyps.  There is a remote history of hospitalization   in the 1980s for colitis, possible ulcerative colitis, but no further   problems from this subsequently.  She has no urinary symptoms.  No   vaginal discharge or bleeding.      PAST MEDICAL HISTORY:      PREVIOUS SURGERY:   1. Laparoscopic Nissen fundoplication for chronic reflux by Dr.  Luretha Murphy.  She has had esophageal strictures dilated prior to       this.   2. She has had bilateral breast cancer with lumpectomy on the left,       1996, by Dr. Maple Hudson, with radiation and hormone therapy, and       subsequent right breast lumpectomy, radiation, and hormone therapy       in 2003.   3. She has also had cholecystectomy.   4. Rotator cuff surgery.      MEDICAL HISTORY:   1. She is followed for seizure disorder.   2. She has history of borderline hypertension.   3. Osteoporosis.   4. History of GERD.      CURRENT MEDICATIONS:   1. Dilantin 100 mg twice daily, with 3 daily on Wednesday and Sunday.   2. Lamictal 50 mg b.i.d.   3. Alprazolam 0.125 mg q.i.d. p.r.n.      ALLERGIES:  SULFA.      SOCIAL HISTORY:  She is married.  Retired.  Does not smoke cigarettes or   drink alcohol.      FAMILY HISTORY:  Noncontributory.      REVIEW OF SYSTEMS:  GENERAL:  Positive for some subjective fever with   this illness.  Weight is up recently.  HEENT:  Denies vision, hearing,   or swallowing problems.  RESPIRATORY:  Denies shortness breath, cough,   wheezing, or history of respiratory problems.  CARDIAC:  Denies chest   pain, palpitations, swelling, or history of heart disease.  ABDOMEN/GI:   As above.  GU:  As above.  MUSCULOSKELETAL:  She  has some chronic joint   pain. She has cramping in her calves at night.  NEUROLOGIC:  No recent   seizures, syncope, numbness, or weakness.      PHYSICAL EXAMINATION:  VITAL SIGNS:  Temperature is 974, pulse 104,   respirations 16, blood pressure 132/84.   GENERAL:  She is a mildly obese white female, in no acute distress.   SKIN:  Warm and dry.  No rash or infection.   HEENT:  No palpable mass or thyromegaly.  Sclerae anicteric.   LYMPH NODES:  No cervical, subclavicular, or axillary nodes palpable.   BREASTS:  Bilateral lumpectomies.  No mass, skin changes, or evidence of   recurrence locally.   LUNGS:  Clear without wheezing or increased work of breathing.   CARDIAC:  Regular rate and rhythm.  No murmurs.  No edema.  Peripheral   pulses intact.   ABDOMEN:  Mildly obese.  Well-healed laparoscopic incisions.  No   hernias.  Bowel sounds are present, slightly high pitched.  There is   tenderness across the lower abdomen, with guarding, more on the left   than the right.  No palpable masses or hepatosplenomegaly.  The upper   abdomen is relatively soft and nontender.  No organomegaly.   EXTREMITIES:  No joint swelling or deformity.   NEUROLOGIC:  Alert, oriented.  Motor and sensory exams grossly normal.      LABORATORY:  From Dr. Alver Fisher office today, shows white count 10.5,   hemoglobin 12.7 normal indices, platelets 442.  Electrolytes, BUN,   creatinine, glucose, LFTs all normal.      CT scan of the abdomen pelvis was obtained today at [facility].  This shows   focal fluid accumulation, 3.5 cm, adjacent to the sigmoid colon,   consistent with a pericolonic abscess.  Diverticulosis is noted, without   severe diverticulitis.  Also noted  are two areas of more proximal small   bowel wall thickening, with proximal dilatation, questioning a small   bowel tumor.      ASSESSMENT AND PLAN:  A 76 year old female with history of mild packs of   diverticulitis, and now an apparently more severe  attack of   diverticulitis and possible pericolonic abscess.  She does not appear   severely ill, and certainly there is no indication for emergency   surgery.  I would agree with bowel rest and broad-spectrum antibiotics.   I would have interventional radiology review her CT scan to see if she   would be appropriate for percutaneous drainage of her pericolonic fluid   collection.      There are small bowel abnormalities questioned on her CT, with possible   partial obstruction.  These will require further evaluation possibly   with small-bowel follow-through or capsule endoscopy.  This can be   postponed until her acute illness is improving.  Will follow with you.               Lorne Skeens. Hoxworth, M.D.   Electronically Signed            BTH/MEDQ  D:  04/16/2007  T:  04/17/2007  Job:  [id number]

## 2010-09-08 NOTE — Initial Assessments (Signed)
Summary: Consultation  NAME:  [name], [name] NO.:  [id number]      MEDICAL RECORD NO.:  [id number]          PATIENT TYPE:  INP      LOCATION:  5733                         FACILITY:  [facility]      PHYSICIAN:  Di Kindle. Edilia Bo, M.D.DATE OF BIRTH:  1935/03/23      DATE OF CONSULTATION:  05/10/2007   DATE OF DISCHARGE:                                    CONSULTATION      REASON FOR CONSULTATION:  Left lower extremity DVT.      HISTORY:  This is a pleasant 76 year old woman who had recently been   admitted at [facility] with diverticulitis.  She was admitted on   April 16, 2007 and discharged on April 19, 2007.  She was treated   with IV antibiotics.  After discharge, she later developed some swelling   in her left lower extremity and then some left calf pain.  This prompted   a venous duplex scan which was done at [facility]   on May 08, 2007.  This showed no evidence of DVT on the right side,   and on the left side, there was DVT involving the external iliac vein   extending into the common femoral vein.  There was also some clot in the   posterior tibial and peroneal veins.  She was admitted for IV heparin   and Coumadin therapy, and vascular surgery was consulted for further   evaluation of her left lower extremity DVT, given significant swelling   in the left leg.      The patient is unaware of any previous history of DVT.  She does state   that she had some phlebitis after some breast surgery back in 1996 but   cannot remember the details of this.  She is unaware of any family   history of DVT.  Her risk factors for DVT include history of breast   cancer.  In addition, she was hospitalized with limited activity during   her episode of diverticulitis, although she was covered with Lovenox   during that admission.      PAST MEDICAL HISTORY:   1. Hypertension.   2. History of a seizure disorder.   3. History of anemia.   4. Diverticulitis.      She denies any history of diabetes, hypercholesterolemia, history of   previous myocardial infarction, or history of congestive heart failure.      SOCIAL HISTORY:  She is married and has two children.  She does not use   tobacco.      FAMILY HISTORY:  There is no history of premature cardiovascular   disease.      REVIEW OF SYSTEMS:  She had no recent chest pain, chest pressure,   palpitations or arrhythmias.  She had no pleuritic chest pain.  She has   no claudication or history of rest pain, nonhealing ulcers.  She has had   her recent episode of diverticulitis.  She has had no recent fevers   tachypnea.  Review of systems is  otherwise unremarkable and is   documented in her admission history and physical.      PHYSICAL EXAMINATION:  VITAL SIGNS:  Temperature is 98.1, heart rate is   88, blood pressure 145/72.   NECK:  I do not detect any carotid bruits.   LUNGS:  Clear bilaterally to auscultation.   CARDIAC:  She has a regular rate and rhythm.   ABDOMEN:  Soft and nontender.  She has palpable femoral pulses.  I   cannot palpate pedal pulses although she does have good monophasic   Doppler signals in the left foot with monophasic Doppler signals in the   right foot and a biphasic posterior tibial signal on the right.  There   is moderate left lower extremity swelling up to the groin.      This patient presents with an extensive DVT of the left lower extremity.   I agree with the plans for heparin and Coumadin, and I think she will   probably need 6 months of Coumadin.  At that time, her venous duplex   scan can be repeated to decide if she might potentially benefit from a   longer course of Coumadin therapy.  I agree that I do not think she is   an ideal candidate for thrombolysis given her recent diverticular   abscess.  We have discussed the importance of leg elevation to prevent   progressive swelling and arterial compromise.  Currently, she has    excellent arterial flow and the foot is well-perfused.  I will be happy   to see her back if any new problems arise.               Di Kindle. Edilia Bo, M.D.   Electronically Signed            CSD/MEDQ  D:  05/10/2007  T:  05/12/2007  Job:  [id number]

## 2010-09-08 NOTE — Discharge Summary (Signed)
Summary: Discharge Summary  NAME:  NYARI, OLSSON              ACCOUNT NO.:  [id number]      MEDICAL RECORD NO.:  [id number]          PATIENT TYPE:  INP      LOCATION:  5012                         FACILITY:  [facility]      PHYSICIAN:  Kari Baars, M.D.  DATE OF BIRTH:  April 17, 1935      DATE OF ADMISSION:  04/16/2007   DATE OF DISCHARGE:  04/19/2007                                  DISCHARGE SUMMARY      DISCHARGE DIAGNOSES:   1. Sigmoid diverticulitis with possible abscess, improving.   2. Anemia of chronic disease.   3. Heme-positive stool, likely secondary to #1.   4. Abnormal appearance on small bowel appearance on noncontrasted CT,       normal on CT enterography.   5. Hypertension.   6. History of left breast cancer (1996).  Status post radiation and       lumpectomy.   7. Right breast cancer (2001) status post lumpectomy, radiation       therapy, and Aromasin therapy for 5 years.   8. Seizure disorder.   9. Hypertension.   10.Diverticulosis with a history of diverticulitis (2006).   11.History of colon polyps (March 2007).   12.Depression/anxiety.   13.Osteoporosis.   14.History of peptic ulcer disease.   15.Status post hiatal hernia repair (2002).   16.Status post cholecystectomy (1994)   17.Status post left neck mass removal (benign, 1999).   18.History of cervical ulcer, treated (1978).   19.Status post right rotator cuff repair (July 2006).      DISCHARGE MEDICATIONS:   1. Augmentin 875 mg b.i.d. for 7 days.   2. Flagyl 500 mg q.8 h times 7 days.   3. Dilantin 100 mg b.i.d..   4. Lamictal 100 mg daily and 50 mg b.i.d. as before.   5. Xanax 0.25 mg b.i.d. p.r.n.   6. Zoloft 50 mg daily.   7. Lisinopril HCT 20/12.5 mg daily.   8. Celebrex 20 mg daily.   9. Citrucel b.i.d.   10.Multivitamin daily.      HOSPITAL PROCEDURES:   1. CT of the pelvis without contrast (September 10).  This was       performed prior to planned percutaneous aspiration/drainage.  This         showed significant decrease in pelvic fluid compared to prior exam.       Therefore, drainage was not attempted.   2. CT enteroscopy of abdomen and pelvis with contrast (September 12).       No acute process in the abdomen.  Moderate hiatal hernia.   3. Cholecystectomy without biliary ductal dilatation.  Lipomatous       hypertrophy of the interatrial septum.  Sigmoid colon       diverticulosis with possible adjacent edema which could relate to       resolving diverticulitis.      HOSPITAL CONSULTS:  General surgery - Dr. Glenna Fellows and Dr. Avel Peace.      HISTORY OF PRESENT ILLNESS:  For full details, please see dictated  history and physical.  Briefly, Ms. Scobie is a 76 year old white   female with a history of bilateral breast cancer (1996 left, 2001 right)   status post radiation therapy and lumpectomy for each, history of   diverticulosis with a history of diverticulitis (around 2006) and   history of hiatal hernia surgical repair (2002) who presented to the   office on two occasions in one week for left lower quadrant pain.  She   was initially seen for abdominal pain on September 3 for with a 2-day   history of severe left lower quadrant abdominal pain, nausea, and fever   to 101.3.  She was placed empirically on Cipro and Flagyl for presumed   diverticulitis and instructed to call if her pain persisted.  Initially,   she noted some improvement.  However, over the past 2 days prior to   admission, she noted significant increase in abdominal pain, nausea, and   diarrhea.  She called and was sent for a CT of the abdomen and pelvis   which was performed at [facility] and showed a left lower quadrant   fluid collection measuring 3.5 centimeters tracking about 7 cm   concerning for an abscess..  On exam, she was much more tender with   possible rebound and guarding, and she was admitted for further   management.  Of note, the CT also showed some small  bowel thickening   with proximal bowel dilatation concerning for possible small bowel   malignancy.  She was admitted for management of diverticular abscess and   abnormal CT findings.      HOSPITAL COURSE:  The patient was admitted to a medical bed.  Her   antibiotics were changed to IV Cipro and Flagyl.  With her initial dose   of Cipro, she developed some right arm itching and urticaria.   Therefore, this was transitioned to Unasyn IV, and she was continued on   IV Flagyl.  With this, she had significant improvement in her abdominal   pain within 24 hours.  Her outside CT was read by Radiology who did feel   that there was a small fluid collection.  Given this finding, a CT   guided aspiration/drainage catheter placement was ordered.  Upon review   of the CT, on the morning after admission, the fluid collection was   noted to be much smaller, consistent with improving diverticulitis.   Therefore, aspiration was not performed.  The patient was seen by   General Surgery on admission who did not feel that surgical intervention   was necessary.      With continued IV antibiotics.  The patient's left lower quadrant pain   significantly improved to the point that she was stable for discharge   home.      While the patient was inpatient, she also underwent evaluation of the CT   abnormalities revealing proximal jejunum small bowel thickening.  This   was reviewed.  The outside film was reviewed by the radiologist here who   felt that further evaluation was warranted.  General Surgery and Dr.   Yancey Flemings, with whom I had a discussion over the phone, recommended CT   enteropathy.  This was performed on September 12 following a steroid   premedication for prior contrast allergy.  The patient tolerated the   procedure well.  The CT did not show any findings of small bowel   abnormality and showed no acute process in the abdomen.  It  did show   sigmoid colon diverticulosis with possible  adjacent edema consistent   with resolving diverticulitis.      The patient was noted to be anemic with a hemoglobin of 10.4 on   admission.  Anemia profile was consistent with anemia of chronic disease   with a ferritin of 69.  Of note, she was heme-positive felt secondary to   diverticulitis.  This will be monitored as an outpatient..      At this point, the patient is stable for discharge home with close   outpatient follow-up.      DISCHARGE LABORATORIES:  CBC on the day of discharge shows a white count   of 5.2, hemoglobin 9.9, platelets 375.  B-met is normal with sodium 141,   potassium 4.3, chloride 110, bicarb 23, BUN 8, creatinine 0.7, glucose   128.  Anemia profile shows a reticulocyte count of 0.9%, iron 82,   percent sat 28%, TIBC 298, and ferritin 69.  Folic acid greater than 20.   B12 518.  Stool Hemoccult positive.      DISCHARGE INSTRUCTIONS:  The patient was instructed to call if she has   increasing abdominal pain, fever, or blood in her stools.      HOSPITAL FOLLOW-UP:  She will follow up with Dr. Clelia Croft in one week at   [facility].  Will recheck her CBC at that point..      DISPOSITION:  To home.               Kari Baars, M.D.   Electronically Signed            WS/MEDQ  D:  04/23/2007  T:  04/23/2007  Job:  [id number]      cc:   Adolph Pollack, M.D.   Wilhemina Bonito. Marina Goodell, MD   Georga Hacking, M.D.   Lennis P. Darrold Span, M.D.

## 2010-09-08 NOTE — Procedures (Signed)
Summary: Silvana Newness, MD   EGD  Procedure date:  03/08/2001  Findings:      Location: [facility]  Findings: Duodenitis  Hiatal Hernia  EGD  Procedure date:  03/08/2001  Findings:      Location: [facility]  Findings: Duodenitis  Hiatal Hernia Patient Name: Diane, [name] MRN:  Procedure Procedures: Panendoscopy (EGD) CPT: 43235.  Personnel: Endoscopist: Ulyess Mort, MD.  Exam Location: Exam performed in Outpatient Clinic. Outpatient  Patient Consent: Procedure, Alternatives, Risks and Benefits discussed, consent obtained, from patient.  Indications Symptoms: Dysphagia. Reflux symptoms  History  Pre-Exam Physical: Performed Mar 08, 2001  Cardio-pulmonary exam, HEENT exam, Abdominal exam, Extremity exam, Mental status exam WNL.  Exam Exam Info: Maximum depth of insertion Duodenum, intended Duodenum. Patient position: on left side. Vocal cords visualized. Gastric retroflexion performed. Images taken. ASA Classification: II. Tolerance: good.  Sedation Meds: Patient assessed and found to be appropriate for moderate (conscious) sedation. Fentanyl 50 mcg. Versed 5 mg. Cetacaine Spray 1 sprays  Monitoring: BP and pulse monitoring done. Oximetry used. Supplemental O2 given  Findings - HIATAL HERNIA: 3 cms. in length. severe stricture and defrmity 2nd. to esoph. ulcer forming  a pseudodiverticulum .  - MUCOSAL ABNORMALITY: Duodenal Bulb to Jejunum. Granular mucosa. Comment: mod. severe chronic duodenitis.   Assessment Abnormal examination, see findings above.  Events  Unplanned Intervention: No unplanned interventions were required.  Unplanned Events: There were no complications. Plans Medication(s): Continue current medications.  Patient Education: Patient given standard instructions for: Hiatal Hernia. Reflux. Stenosis / Stricture. needs dilatation by savary.  Disposition: After procedure patient sent to recovery.  After recovery patient sent home.   This report was created from the original endoscopy report, which was reviewed and signed by the above listed endoscopist.

## 2010-09-08 NOTE — Letter (Signed)
Summary: Patient Notice- Polyp Results  [facility]  [address] [place], [place] [address]   Phone: [phone]  Fax: [phone]        April 07, 2010 MRN: [id number]    RILEY[address] FLOR[address] [place], [place]  [address]    Dear Ms. Steury,  I am pleased to inform you that the colon polyp(s) removed during your recent colonoscopy was (were) found to be benign (no cancer detected) upon pathologic examination.  Because of the number, size, and type of polyps, I recommend you have a repeat colonoscopy examination in ONE year to look for recurrent polyps, as having colon polyps increases your risk for having recurrent polyps or even colon cancer in the future.  Should you develop new or worsening symptoms of abdominal pain, bowel habit changes or bleeding from the rectum or bowels, please schedule an evaluation with either your primary care physician or with me.  Additional information/recommendations:  __ No further action with gastroenterology is needed at this time. Please      follow-up with your primary care physician for your other healthcare      needs.    Please call us if you are having persistent problems or have questions about your condition that have not been fully answered at this time.  Sincerely,  Hilarie Fredrickson MD  This letter has been electronically signed by your physician.  Appended Document: Patient Notice- Polyp Results letter mailed 9.2.11

## 2010-09-08 NOTE — Assessment & Plan Note (Signed)
Summary: anemia, Hemoccult-positive stool, dysphagia, hx of polyps   History of Present Illness Visit Type: consult Primary GI MD: Yancey Flemings MD Primary Provider: Eric Form, MD Requesting Provider: Eric Form, MD Chief Complaint: anemia,occult blood in stool History of Present Illness:   76 year old female with a history of hypertension, hyperlipidemia, breast cancer, anxiety, GERD status post fundoplication, esophageal stricture requiring dilation, and colon polyps. She presents today regarding anemia and Hemoccult-positive stool. Reviewing outside records finds her hemoglobin in June of 2010 to be 10.5. More recently, hemoglobin January 06, 2010 (review of outside laboratories) was 11.3 with normal MCV of 96.8. B12 folate and iron studies were all normal. Hemoccult testing return positive. She is now referred. Her only new GI complaint is that of recurrent solid food dysphagia. She last underwent upper endoscopy with esophageal dilation in 2008. She has had multiple endoscopies with Dr. Corinda Gubler for adenomatous polyps. She was due for routine followup this coming March. She has chronic herbal bowel-type complaints with postprandial abdominal bloating discomfort and intermittent loose stools. She has had this for some time. Her weight has been stable. No gross bleeding. She does use NSAIDs, but states that the use is infrequent.   GI Review of Systems    Reports abdominal pain, acid reflux, dysphagia with solids, and  heartburn.      Denies belching, bloating, chest pain, dysphagia with liquids, loss of appetite, nausea, vomiting, vomiting blood, weight loss, and  weight gain.      Reports change in bowel habits, diverticulosis, and  irritable bowel syndrome.     Denies anal fissure, black tarry stools, constipation, diarrhea, fecal incontinence, heme positive stool, hemorrhoids, jaundice, light color stool, liver problems, rectal bleeding, and  rectal pain. Preventive Screening-Counseling &  Management  Alcohol-Tobacco     Smoking Status: never      Drug Use:  no.      Current Medications (verified): 1)  Alprazolam 0.5 Mg Tabs (Alprazolam) .... Take 1 Tablet By Mouth Two Times A Day As Needed 2)  Citracal Petites/vitamin D 200-250 Mg-Unit Tabs (Calcium Citrate-Vitamin D) .... Take 1 Tablet By Mouth Two Times A Day 3)  Dilantin 100 Mg Caps (Phenytoin Sodium Extended) .... Take 1 Tablet By Mouth Two Times A Day 4)  Lamictal 100 Mg Tabs (Lamotrigine) .... Take 1/2 Tablet By Mouth Two Times A Day 5)  Multivitamins  Tabs (Multiple Vitamin) .... Take 1 Tablet By Mouth Once A Day 6)  Naproxen 500 Mg Tabs (Naproxen) .... Take As Needed For Arthritis Pain 7)  Zoloft 50 Mg Tabs (Sertraline Hcl) .... Take 1 Tablet By Mouth Once A Day 8)  Promethazine Hcl 25 Mg Tabs (Promethazine Hcl) .... Take 1 Tablet By Mouth Every 6 Hours As Needed Nausea 9)  Vitamin D 1000 Unit Tabs (Cholecalciferol) .... Take 1 Tablet By Mouth Two Times A Day 10)  Zocor 40 Mg Tabs (Simvastatin) .... Take 1/2 Tablet By Mouth Two Times A Day 11)  Hydroxyzine Hcl 10 Mg Tabs (Hydroxyzine Hcl) .... Take 1 Tablet By Mouth Once A Day As Needed 12)  Lisinopril-Hydrochlorothiazide 20-12.5 Mg Tabs (Lisinopril-Hydrochlorothiazide) .... Take 1 Tablet By Mouth Once A Day 13)  Reclast 5 Mg/19ml Soln (Zoledronic Acid) .... One Injection Every June  Allergies (verified): 1)  Sulfa  Past History:  Past Medical History: Hiatal Hernia Anemia Anxiety Disorder Arthritis Breast Cancer Adenomatous Colon Polyps Diverticulosis Esophageal Stricture GERD Hyperlipidemia Hypertension  Past Surgical History: Cholecystectomy 2000 tubal ligation  reflux surgery hernia surgery 2000 Breast-Lumpectomy-Bilateral  Family History: Family History of Heart Disease: Father Family History of Colitis/Crohn's: Mother-UC  Social History: Married, 1 boy, 1 girl Retired Patient has never smoked.  Alcohol Use - no Illicit Drug Use -  no Smoking Status:  never Drug Use:  no  Review of Systems  The patient denies allergy/sinus, anemia, anxiety-new, arthritis/joint pain, back pain, blood in urine, breast changes/lumps, confusion, cough, coughing up blood, depression-new, fainting, fatigue, fever, headaches-new, hearing problems, heart murmur, heart rhythm changes, itching, muscle pains/cramps, night sweats, nosebleeds, shortness of breath, skin rash, sleeping problems, sore throat, swelling of feet/legs, swollen lymph glands, thirst - excessive, urination - excessive, urination changes/pain, urine leakage, vision changes, and voice change.    Vital Signs:  Patient profile:   76 year old female Height:      61 inches Weight:      153 pounds BMI:     29.01 Pulse rate:   60 / minute Pulse rhythm:   regular BP sitting:   120 / 68  (right arm) Cuff size:   regular  Vitals Entered By: Francee Piccolo CMA Duncan Dull) (February 24, 2010 11:18 AM)  Physical Exam  General:  Well developed, well nourished, no acute distress. Head:  Normocephalic and atraumatic. Eyes:  PERRLA, no icterus. Nose:  No deformity, discharge,  or lesions. Mouth:  No deformity or lesions,. Neck:  Supple; no masses or thyromegaly. Lungs:  Clear throughout to auscultation. Heart:  Regular rate and rhythm; no murmurs, rubs,  or bruits. Abdomen:  Soft, nontender and nondistended. No masses, hepatosplenomegaly or hernias noted. Normal bowel sounds. Rectal:  deferred until colonoscopy Msk:  Symmetrical with no gross deformities. Normal posture. Pulses:  Normal pulses noted. Extremities:  No clubbing, cyanosis, edema or deformities noted. Neurologic:  Alert and  oriented x4. Skin:  Intact without significant lesions or rashes. Psych:  Alert and cooperative. Normal mood and affect.   Impression & Recommendations:  Problem # 1:  ANEMIA-UNSPECIFIED (ICD-285.9) normocytic anemia. Hemoglobin actually improved from one year previous. Etiology of anemia  uncertain. Hemoccult positive stool noted.  Problem # 2:  FECAL OCCULT BLOOD (ICD-792.1) Hemoccult positive stool in a patient with prior fundoplication, occasional NSAID use, and a history of adenomatous colon polyps. Rule out significant mucosal abnormality of the GI tract.  Plan: #1. Colonoscopy and upper endoscopy. The nature of the procedures as well as the risks, benefits, and alternatives were reviewed. She understood and agreed to proceed. #2. Movi prep prescribed. The patient instructed on its use  Problem # 3:  DYSPHAGIA (ICD-787.29) recurrence of intermittent dysphagia. Last dilated in 2008, with 54 [place] Maloney dilator.  Plan: #1. Esophageal dilation. The nature of the procedure as well as the risks, benefits, and alternatives were reviewed. She understood and agreed to proceed  Problem # 4:  GERD (ICD-530.81) status post fundoplication  Problem # 5:  PERSONAL HX COLONIC POLYPS (ICD-V12.72) history of adenomatous polyps. Was due for followup in about 8 months. We will proceed with that exam at this time given the development of Hemoccult-positive stool.  Other Orders: Colon/Endo (Colon/Endo)  Patient Instructions: 1)  Colon/Endo LeC 04/05/10 2:30 pm 2)  Movi prep instructions given to patient. 3)  Movi prep Rx. sent to pharmacy. 4)  Colonoscopy and Flexible Sigmoidoscopy brochure given.  5)  Upper Endoscopy brochure given.  6)  Copy sent to : Buren Kos, MD 7)  The medication list was reviewed and reconciled.  All changed / newly prescribed medications were explained.  A complete medication list  was provided to the patient / caregiver. Prescriptions: MOVIPREP 100 GM  SOLR (PEG-KCL-NACL-NASULF-NA ASC-C) As per prep instructions.  #1 x 0   Entered by:   Milford Cage NCMA   Authorized by:   Hilarie Fredrickson MD   Signed by:   Milford Cage NCMA on 02/24/2010   Method used:   Electronically to        [facility] [address] (retail)       [phone]       [place], [place]  [address]       Ph: [phone]       Fax: [phone]   RxID:   [id number]

## 2010-09-08 NOTE — Progress Notes (Signed)
Summary: [facility]   Imported By: Sherian Rein 02/25/2010 13:34:36  _____________________________________________________________________  External Attachment:    Type:   Image     Comment:   External Document

## 2010-09-08 NOTE — Discharge Summary (Signed)
Summary: Discharge Summary                        [facility]  Patient:    [name], [name] Visit Number: [id number] MRN: [id number]          Service Type: SUR Location: 3W 0344 01 Attending Physician:  Katha Cabal Dictated by:   [place] Daphine Deutscher, M.D. Admit Date:  08/02/2001 Discharge Date: 08/04/2001   CC:         Corwin Levins, M.D.  Ulyess Mort, M.D. 2 copies   Discharge Summary  ADMISSION DIAGNOSIS:  Gastroesophageal reflux disease and stricture with past history of breast cancer.  HOSPITAL COURSE:  [name] is a 76 year old lady who has had long-standing reflux.  She was brought to the operating room on 08/02/01, and underwent repair of a hiatal hernia and laparoscopic Nissen fundoplication.  She tolerated this procedure well, and was ready for discharge on 08/04/01, which was the second postoperative day.  She had a swallow which showed a good wrap intact without evidence of leak.  She was instructed in dietary measures, and will be followed up in the office in two weeks. Dictated by:   [place] Daphine Deutscher, M.D. Attending Physician:  Katha Cabal DD:  08/22/01 TD:  08/23/01 Job: [id number]

## 2010-09-08 NOTE — Procedures (Signed)
Summary: Colonoscopy  Patient: [name] Note: All result statuses are Final unless otherwise noted.  Tests: (1) Colonoscopy (COL)   COL Colonoscopy           DONE     [facility]     [address]     [place], [place]  [address]           COLONOSCOPY PROCEDURE REPORT           PATIENT:  Diane, [name]  MR#:  [id number]     BIRTHDATE:  1934-10-28, 74 yrs. old  GENDER:  female     ENDOSCOPIST:  Wilhemina Bonito. Eda Keys, MD     REF. BY:  Kari Baars, M.D.     PROCEDURE DATE:  04/05/2010     PROCEDURE:  Colonoscopy with snare polypectomy x 5,     Colonoscopy with submucosal     injection [place] Ink Tattoo     ASA CLASS:  Class II     INDICATIONS:  history of pre-cancerous (adenomatous) colon polyps,     surveillance and high-risk screening, heme positive stool ; mult     prior colonoscopies [facility]) - last 10-2005     MEDICATIONS:   Fentanyl 100 mcg IV, Versed 10 mg IV, Benadryl 25     mcg IV           DESCRIPTION OF PROCEDURE:   After the risks benefits and     alternatives of the procedure were thoroughly explained, informed     consent was obtained.  Digital rectal exam was performed and     revealed no abnormalities.   The LB CF-[id number] endoscope     was introduced through the anus and advanced to the cecum, which     was identified by both the appendix and ileocecal valve, without     limitations.Time to cecum =5:07 min.  The quality of the prep was     excellent, using MoviPrep.  The instrument was then slowly     withdrawn (Time = 29:29 min)as the colon was fully examined.     <<PROCEDUREIMAGES>>           FINDINGS:  Four polyps were found in the ascending colon. Three     werew < 5mm and one was 8mm and sessile. Polyps were snared     without cautery. Retrieval was successful.  A 20mm sessile polyp     was found in the sigmoid colon @ 30cm. Polyp was snared piecemeal     without cautery. Retrieval was successful. An ink tatttoo was     placed opposite the  polypectomy site for the purposes of future     identification.  Moderate diverticulosis was found in the sigmoid     colon with some stenosis.   Retroflexed views in the rectum     revealed no abnormalities.    The scope was then withdrawn from     the patient and the procedure completed.           COMPLICATIONS:  None           ENDOSCOPIC IMPRESSION:     1) Four polyps in the ascending colon - removed     2) Sessile polyp in the sigmoid colon - removed piecemeal     3) Moderate diverticulosis in the sigmoid colon with stenosis           RECOMMENDATIONS:     1) Follow up colonoscopy in 1, 3  or 5 years pending path           ______________________________     Wilhemina Bonito. Eda Keys, MD           CC:  Kari Baars, MD; The Patient           n.     eSIGNED:   Wilhemina Bonito. Eda Keys at 04/05/2010 04:01 PM           Streeper, Bri, [place] [phone]  Note: An exclamation mark (!) indicates a result that was not dispersed into the flowsheet. Document Creation Date: 04/05/2010 4:03 PM _______________________________________________________________________  (1) Order result status: Final Collection or observation date-time: 04/05/2010 15:32 Requested date-time:  Receipt date-time:  Reported date-time:  Referring Physician:   Ordering Physician: Fransico Setters [phone]) Specimen Source:  Source: Launa Grill Order Number: [id number] Lab site:   Appended Document: Colonoscopy     Procedures Next Due Date:    Colonoscopy: 04/2011

## 2010-09-08 NOTE — Progress Notes (Signed)
  Phone Note Outgoing Call   Summary of Call: Pt. given  6 wks.post procedure f/u appt.  Initial call taken by: Teryl Lucy RN,  April 13, 2010 11:38 AM

## 2010-09-08 NOTE — Miscellaneous (Signed)
Summary: Omeprazole Rx.  Clinical Lists Changes  Medications: Added new medication of OMEPRAZOLE 40 MG  CPDR (OMEPRAZOLE) 1 each day 30 minutes before meal - Signed Rx of OMEPRAZOLE 40 MG  CPDR (OMEPRAZOLE) 1 each day 30 minutes before meal;  #30 x 11;  Signed;  Entered by: Jennye Boroughs RN;  Authorized by: Hilarie Fredrickson MD;  Method used: Electronically to [facility] [phone] [place], [place], [place]  [address], Ph: [phone], Fax: [phone]    Prescriptions: OMEPRAZOLE 40 MG  CPDR (OMEPRAZOLE) 1 each day 30 minutes before meal  #30 x 11   Entered by:   Jennye Boroughs RN   Authorized by:   Hilarie Fredrickson MD   Signed by:   Jennye Boroughs RN on 04/05/2010   Method used:   Electronically to        [facility] [address] (retail)       [phone]       [place], [place]  [address]       Ph: [phone]       Fax: [phone]   RxID:   [id number]

## 2010-09-08 NOTE — Procedures (Signed)
Summary: Upper Endoscopy  Patient: [name] Note: All result statuses are Final unless otherwise noted.  Tests: (1) Upper Endoscopy (EGD)   EGD Upper Endoscopy       DONE (C)     [facility]     [address]     [place], [place]  [address]           ENDOSCOPY PROCEDURE REPORT           PATIENT:  [name], [name]  MR#:  [id number]     BIRTHDATE:  1935-03-21, 74 yrs. old  GENDER:  female           ENDOSCOPIST:  Wilhemina Bonito. Eda Keys, MD     Referred by:  Kari Baars, M.D.           PROCEDURE DATE:  04/05/2010     PROCEDURE:  EGD with biopsy     ASA CLASS:  Class II     INDICATIONS:  hemeoccult positive stool, dysphagia           MEDICATIONS:   There was residual sedation effect present from     prior procedure.; Versed 2mg  IV     TOPICAL ANESTHETIC:  Exactacain Spray           DESCRIPTION OF PROCEDURE:   After the risks benefits and     alternatives of the procedure were thoroughly explained, informed     consent was obtained.  The LB GIF-H180 [id number] endoscope was     introduced through the mouth and advanced to the second portion of     the duodenum, without limitations.  The instrument was slowly     withdrawn as the mucosa was fully examined.     <<PROCEDUREIMAGES>>           Erosive Esophagitis was found in the distal esophagus.Bx taken.     A benign ring-like  stricture was found in the distal esophagus.     The stomach was entered and closely examined. The antrum,     angularis, and lesser curvature were well visualized, including a     retroflexed view of the cardia and fundus. The stomach wall was     normally distensable. The scope passed easily through the pylorus     into the duodenum.  The duodenal bulb was normal in appearance, as     was the postbulbar duodenum.    Retroflexed views revealed prior     fundoplication.    The scope was then withdrawn from the patient     and the procedure completed. NO BLIND DILATION DUE TO ACUTE     ANGULATION  OF DISTAL ESOPHAGUS AND THE PRESENCE OF ACUTE     ESOPHAGITIS.           COMPLICATIONS:  None           ENDOSCOPIC IMPRESSION:     1) Esophagitis in the distal esophagus     2) Stricture in the distal esophagus (NOT DILATED)     3) Normal stomach     4) Normal duodenum     5) Prior fundoplication           RECOMMENDATIONS:     1) OMEPRAZOLE 40MG  PO QD; #30; 11 REFILLS     2) Call office next 2-3 days to schedule an office appointment     for 6 WEEEKS WITH DR. PERRY           ______________________________  Wilhemina Bonito. Eda Keys, MD           CC:  Kari Baars, MD, The Patient           n.     REVISED:  05/09/2010 09:57 AM     eSIGNED:   Wilhemina Bonito. Eda Keys at 05/09/2010 09:57 AM           [place], [place], [place] [phone]  Note: An exclamation mark (!) indicates a result that was not dispersed into the flowsheet. Document Creation Date: 05/09/2010 9:58 AM _______________________________________________________________________  (1) Order result status: Final Collection or observation date-time: 04/05/2010 15:43 Requested date-time:  Receipt date-time:  Reported date-time:  Referring Physician:   Ordering Physician: Fransico Setters [phone]) Specimen Source:  Source: Launa Grill Order Number: [id number] Lab site:

## 2010-09-08 NOTE — Procedures (Signed)
Summary: Silvana Newness, MD   EGD  Procedure date:  03/28/2001  Findings:      Location: [facility]  Findings: Stricture:   Patient Name: [name], [name] MRN:  Procedure Procedures: Panendoscopy (EGD) CPT: 43235.    with esophageal dilation. CPT: G9296129.  Personnel: Endoscopist: Ulyess Mort, MD.  Exam Location: Exam performed in Endoscopy Suite.  Patient Consent: Procedure, Alternatives, Risks and Benefits discussed, consent obtained,  Indications Symptoms: Dysphagia.  History  Pre-Exam Physical: Performed Mar 28, 2001  Cardio-pulmonary exam, HEENT exam, Abdominal exam, Extremity exam, Mental status exam WNL.  Exam Exam Info: Maximum depth of insertion Duodenum, intended Duodenum. Patient position: on left side. Vocal cords visualized. Gastric retroflexion performed. Images were not taken. ASA Classification: II. Tolerance: good.  Sedation Meds: Fentanyl 50 mcg. Versed 4 mg.  Monitoring: BP and pulse monitoring done. Oximetry used. Supplemental O2 given  Fluoroscopy: Fluoroscopy was not used.  Findings - STRICTURE / STENOSIS: Distal Esophagus.  Etiology: benign due to reflux. Lumen diameter is 10 mm. ICD9: Esophageal Stricture: 530.3. Comment: 4cm hiatal hernia with severe stricture and 90% healed esokphagecal ulcer .  - Dilation: Duodenal Bulb. Savary dilator used, Diameter: 151718, mm, Minimal Resistance, No Heme present on extraction.  - MUCOSAL ABNORMALITY: Body to Antrum. Erythematous mucosa. Granular mucosa. Comment: mild alkaline gastritis.  - MUCOSAL ABNORMALITY: Duodenal Bulb to Jejunum. Granular mucosa. Comment: mild duo0denitis.   Assessment Abnormal examination, see findings above.  Diagnoses: 530.3: Esophageal Stricture.   Events  Unplanned Intervention: No unplanned interventions were required.  Unplanned Events: There were no complications. Plans Medication(s): Continue current medications.  Disposition: After  procedure patient sent to recovery.   This report was created from the original endoscopy report, which was reviewed and signed by the above listed endoscopist.

## 2010-09-08 NOTE — Progress Notes (Signed)
[facility]   Imported By: Sherian Rein 02/25/2010 13:33:38  _____________________________________________________________________  External Attachment:    Type:   Image     Comment:   External Document

## 2010-09-08 NOTE — Discharge Summary (Signed)
Summary: Discharge Summary  NAME:  [name], [name]              ACCOUNT NO.:  [id number]      MEDICAL RECORD NO.:  [id number]          PATIENT TYPE:  INP      LOCATION:  5733                         FACILITY:  [facility]      PHYSICIAN:  Kari Baars, M.D.  DATE OF BIRTH:  01-10-1935      DATE OF ADMISSION:  05/08/2007   DATE OF DISCHARGE:  05/12/2007                                  DISCHARGE SUMMARY      DISCHARGE DIAGNOSES:   1. Acute left lower extremity DVT.   2. Left lower quadrant abdominal pain.   3. Chronic low back pain secondary to severe L4-L5 and L5-S1 facet       arthropathy.   4. Recent hospitalization for sigmoid diverticulitis with abscess,       resolved.   5. Anemia of chronic disease, stable.   6. History of left breast cancer (1996) status post radiation and       lumpectomy.  Status post right breast cancer (2001), status post       lumpectomy, radiation therapy, and Aromasin medicine therapy for 5       years.   7. Seizure disorder.   8. Hypertension.   9. Depression slash anxiety.   10.History of colon polyps (March 2007).   11.Osteoporosis.   12.History of peptic ulcer disease.   13.Status post hiatal hernia repair (2002).   14.Status post cholecystectomy (1994).   15.Status post left neck mass removal (benign, 1999).   16.History of cervical ulcer, treated (1978).   17.Status post right rotator cuff repair (July 2006).      DISCHARGE MEDICATIONS:   1. Lovenox 70 mg q.12 h   2. Coumadin 5 mg q.h.s. for a goal INR 2-3   3. Dilantin 100 mg b.i.d.   4. Lamictal 50 mg b.i.d.   5. Paxil 20 mg daily.   6. Lisinopril HCT 20/12.5 mg daily.   7. Citracal +D twice daily.   8. Multivitamin daily.   9. Xanax 0.25 mg q.8 h p.r.n. anxiety.   10.Colace 100 mg b.i.d. for stool softener.      HOSPITAL PROCEDURES:  CT of the abdomen and pelvis with contrast   following steroid premedication (October 2004).  No acute abdominal   abnormalities.  Inflammatory  changes around the left femoral and iliac   veins consistent with clinical history of DVT, likely acute.   Development of pelvic pain venous collaterals, likely secondary to DVT.   Colonic diverticulosis without diverticulitis or evidence of residual   abscess.  Post surgical changes at the gastroesophageal junction with   hiatal hernia.  Status post cholecystectomy.      HOSPITAL CONSULT:  Vascular Surgery - Dr. Waverly Ferrari      HISTORY OF PRESENT ILLNESS:  For full details, please see dictated   History and Physical by Dr. Jarold Motto.  Briefly, [name] is a 76-   year-old white female with a history of bilateral breast cancer (1996   and 2001) status post treatment with no evidence of disease, recent  hospitalization for diverticulitis with abscess, resolved, who presented   to the office on a September 30 with complaint of left lower extremity   swelling.  She reported that she had a significant increase in her   swelling at the end of the day associated with significant pain in the   left leg and left groin area.  She was scheduled for a lower extremity   Doppler which was performed on the day of admission and revealed left   lower extremity DVT extending from the external iliac vein into the   common femoral vein and posterior tibial and peroneal veins.  Given the   extensive nature of her clot burden and recent hospitalization for   diverticulitis with heme-positive stool, she was admitted for   anticoagulation therapy.      HOSPITAL COURSE:  The patient was admitted to a medical bed.  She was   placed on bedrest and her leg was elevated.  She was placed initially on   a heparin drip and Coumadin therapy was initiated.  She had a brief   hypotensive episode associated with dizziness that occurred overnight on   the at night of admission.  This rapidly resolved and her blood pressure   remained stable throughout the remainder of her hospital course.  With   this  episode she was given significant fluid hydration which worsened   her left lower extremity swelling.  Her daughter was very concerned   about the progression of the lower extremity edema and requested a   vascular surgery consult which was obtained.  Dr. Edilia Bo, from Vascular   Surgery, reviewed the history with the patient and agreed with   management with anticoagulation.  He did not feel that thrombolytic   therapy was advised given her recent diverticulitis and risk for   complications.      The patient continued to have significant left lower quadrant tenderness   after completion of her recent antibiotic course of her diverticulitis.   Given this tenderness, a CT of the abdomen and pelvis was again   performed with contrast.  She was premedicated with steroids due to her   prior contrast allergy.  This showed no significant intra-abdominal   source of her pain, but did show findings consistent with her DVT.  It   is likely that her abdominal tenderness is related to the inflammation   from this DVT.  What is not clear is whether her DVT may have been   present prior to the prior hospitalization or whether it is a resulting   of her being sedentary for the past several weeks with this illness.   She does not have any evidence of occult malignancy at this point.  She   will be treated for 6 months with anticoagulation with follow-up studies   to document resolution of her clot.      The patient did not have any episodes of chest pain or shortness of   breath to suggest PE.  She remained hemodynamically stable throughout   her hospitalization and is felt stable for discharge home.  She was   given instruction on Coumadin therapy.  Had a patient handout.  She was   able to self administer her Lovenox and was transitioned from heparin to   Lovenox on the day prior to discharge.  She will be discharged home to   continue Lovenox until her Coumadin is therapeutic with an INR of two to  three.  She will return to our office to have this checked in 2 days.      DISCHARGE INSTRUCTIONS:  She was instructed to call if she has   significant increasing leg pain or swelling, fever, abdominal pain,   shortness of breath, or chest pain.      DISCHARGE DIET:  Cardiac prudent with a consistent vitamin K intake.      HOSPITAL FOLLOW-UP:  She will follow up with Wynne Dust, pharm D at   [facility] on Tuesday for an INR check.  She will   follow up with Dr. Clelia Croft in 2 weeks at [facility].      DISPOSITION:  To home.               Kari Baars, M.D.   Electronically Signed            WS/MEDQ  D:  05/12/2007  T:  05/13/2007  Job:  [id number]

## 2010-09-08 NOTE — Procedures (Signed)
Summary: Esophageal Manometry   Esophageal Manometry  Procedure date:  06/13/2001  Findings:      borderline:                     [place]  Patient:    [name], [name] Visit Number: [id number] MRN: [id number]          Service Type: DSU Location: [facility] Attending Physician:  Janalyn Rouse Dictated by:   Vania Rea. Jarold Motto, M.D. [facility] Proc. Date: 06/13/01   CC:         Ulyess Mort, M.D. [facility]   Procedure Report  PROCEDURE:  Esophageal manometry.  FINDINGS: 1. Upper esophageal sphincter:  There appears to be normal coordination between    pharyngeal contraction and cricopharyngeal relaxation. 2. Lower esophageal sphincter:  Mean pressure is depressed at 10 mmHg with    normal relaxation with swallowing. 3. Motility:  A normally propagated peristaltic wave of normal amplitude    and duration throughout the length of the esophagus with dry swallows.    Mean amplitude contraction is 65 mmHg.  I see no evidence of an esophageal    motility disorder.  ASSESSMENT:  This esophageal manometry shows an incompetent lower esophageal sphincter consistent with gastroesophageal reflux disease.  There is no contraindication to fundoplication therapy from this manometry report. Dictated by:   Vania Rea. Jarold Motto, M.D. [facility] Attending Physician:  Janalyn Rouse DD:  06/14/01 TD:  06/15/01 Job: [id number]  This report was created from the original endoscopy report, which was reviewed and signed by the above listed endoscopist.

## 2010-09-09 NOTE — Letter (Signed)
Summary: [facility]  [facility]   Imported By: Sherian Rein 05/11/2010 14:50:53  _____________________________________________________________________  External Attachment:    Type:   Image     Comment:   External Document

## 2010-10-17 DIAGNOSIS — R413 Other amnesia: Secondary | ICD-10-CM

## 2010-10-17 LAB — BASIC METABOLIC PANEL
BUN: 11 mg/dL (ref 6–23)
CO2: 24 mEq/L (ref 19–32)
CO2: 28 mEq/L (ref 19–32)
Calcium: 8.1 mg/dL — ABNORMAL LOW (ref 8.4–10.5)
GFR calc Af Amer: >60 mL/min (ref >60)
GFR calc non Af Amer: >60 mL/min (ref >60)
Glucose, Bld: 119 mg/dL — ABNORMAL HIGH (ref 70–99)
Potassium: 4.2 mEq/L (ref 3.5–5.1)
Sodium: 138 mEq/L (ref 135–145)

## 2010-10-17 LAB — CBC
HCT: 33.4 % — ABNORMAL LOW (ref 36.0–46.0)
Hemoglobin: 11 g/dL — ABNORMAL LOW (ref 12.0–15.0)
Hemoglobin: 9 g/dL — ABNORMAL LOW (ref 12.0–15.0)
MCH: 31 pg (ref 26.0–34.0)
MCH: 31.1 pg (ref 26.0–34.0)
MCH: 31.6 pg (ref 26.0–34.0)
MCHC: 32.9 g/dL (ref 30.0–36.0)
MCV: 93.8 fL (ref 78.0–100.0)
MCV: 94.4 fL (ref 78.0–100.0)
Platelets: 286 10*3/uL (ref 150–400)
RBC: 2.9 MIL/uL — ABNORMAL LOW (ref 3.87–5.11)
RDW: 13.6 % (ref 11.5–15.5)
RDW: 13.7 % (ref 11.5–15.5)
WBC: 8.1 10*3/uL (ref 4.0–10.5)

## 2010-10-17 LAB — POCT CARDIAC MARKERS: Troponin i, poc: <0.05 ng/mL (ref 0.00–0.09)

## 2010-10-17 LAB — CARDIAC PANEL(CRET KIN+CKTOT+MB+TROPI)
CK, MB: 0.9 ng/mL (ref 0.3–4.0)
Relative Index: INVALID (ref 0.0–2.5)
Troponin I: 0.02 ng/mL (ref 0.00–0.06)

## 2010-10-17 LAB — LIPID PANEL
Cholesterol: 194 mg/dL (ref 0–200)
HDL: 50 mg/dL (ref >39)
LDL Cholesterol: 117 mg/dL — ABNORMAL HIGH (ref 0–99)
LDL Cholesterol: 140 mg/dL — ABNORMAL HIGH (ref 0–99)
Total CHOL/HDL Ratio: 3.9 RATIO
Total CHOL/HDL Ratio: 3.9 RATIO
Triglycerides: 133 mg/dL (ref <150)
Triglycerides: 99 mg/dL (ref <150)
VLDL: 20 mg/dL (ref 0–40)

## 2010-10-17 LAB — DIFFERENTIAL
Basophils Absolute: 0 10*3/uL (ref 0.0–0.1)
Basophils Relative: 0 % (ref 0–1)
Eosinophils Absolute: 0.2 10*3/uL (ref 0.0–0.7)
Eosinophils Relative: 2 % (ref 0–5)
Monocytes Absolute: 0.7 10*3/uL (ref 0.1–1.0)
Monocytes Relative: 8 % (ref 3–12)
Neutro Abs: 6.3 10*3/uL (ref 1.7–7.7)

## 2010-10-17 LAB — COMPREHENSIVE METABOLIC PANEL
Albumin: 3.2 g/dL — ABNORMAL LOW (ref 3.5–5.2)
BUN: 20 mg/dL (ref 6–23)
Creatinine, Ser: 1.14 mg/dL (ref 0.4–1.2)
Total Bilirubin: 0.6 mg/dL (ref 0.3–1.2)
Total Protein: 6.1 g/dL (ref 6.0–8.3)

## 2010-10-17 LAB — PROTIME-INR
INR: 1 (ref 0.00–1.49)
INR: 1.07 (ref 0.00–1.49)

## 2010-10-17 LAB — MAGNESIUM: Magnesium: 2.2 mg/dL (ref 1.5–2.5)

## 2010-10-17 LAB — CK TOTAL AND CKMB (NOT AT ARMC)
CK, MB: 0.8 ng/mL (ref 0.3–4.0)
Relative Index: INVALID (ref 0.0–2.5)
Total CK: 50 U/L (ref 7–177)

## 2010-10-17 LAB — HEMOGLOBIN A1C: Mean Plasma Glucose: 137 mg/dL — ABNORMAL HIGH (ref <117)

## 2010-11-15 LAB — POCT I-STAT, CHEM 8
BUN: 26 mg/dL — ABNORMAL HIGH (ref 6–23)
Creatinine, Ser: 1.1 mg/dL (ref 0.4–1.2)
Hemoglobin: 10.5 g/dL — ABNORMAL LOW (ref 12.0–15.0)
Potassium: 3.4 mEq/L — ABNORMAL LOW (ref 3.5–5.1)
Sodium: 141 mEq/L (ref 135–145)
TCO2: 25 mmol/L (ref 0–100)

## 2010-11-15 LAB — CBC
HCT: 30.7 % — ABNORMAL LOW (ref 36.0–46.0)
Hemoglobin: 10.6 g/dL — ABNORMAL LOW (ref 12.0–15.0)
MCHC: 34.7 g/dL (ref 30.0–36.0)
MCV: 93 fL (ref 78.0–100.0)
Platelets: 264 10*3/uL (ref 150–400)
RDW: 12.4 % (ref 11.5–15.5)

## 2010-11-15 LAB — URINALYSIS, MICROSCOPIC ONLY
Bilirubin Urine: NEGATIVE
Glucose, UA: NEGATIVE mg/dL
Hgb urine dipstick: NEGATIVE
Ketones, ur: NEGATIVE mg/dL
Protein, ur: NEGATIVE mg/dL
pH: 5.5 (ref 5.0–8.0)

## 2010-12-20 NOTE — H&P (Signed)
NAMEMARCELLINE, [name] NO.:  [id number]   MEDICAL RECORD NO.:  [id number]          PATIENT TYPE:  INP   LOCATION:  5012                         FACILITY:  [facility]   PHYSICIAN:  Kari Baars, M.D.  DATE OF BIRTH:  12/13/1934   DATE OF ADMISSION:  04/16/2007  DATE OF DISCHARGE:                              HISTORY & PHYSICAL   CHIEF COMPLAINT:  Abdominal pain.   HISTORY OF PRESENT ILLNESS:  [name] is a very pleasant 76 year old  white female with a history of bilateral breast cancer (left, 1996;  right, 2001), status post radiation therapy and lumpectomy on each,  diverticulosis, and a history of hiatal hernia surgical repair (2002),  who presented to the office for the second time in one week with  persistent left lower quadrant abdominal pain.  The patient was  initially seen on April 10, 2007 for a 2-day history of severe left  lower quadrant abdominal pain, nausea, and fever up to 101.3.  Her  history and exam were consistent with diverticulitis, and she was placed  empirically on Cipro and Flagyl and instructed to call back if her pain  persisted.  Initially, she reported some improvement in her abdominal  pain.  However, over the past 2 days she has had an increase in  abdominal pain, nausea, and diarrhea.  She states that it hurts to  walk.  When she called on the morning of admission, she was sent for a  CT of the abdomen and pelvis (without contrast, due to a reported prior  contrast allergy), which was performed at [facility] and revealed a  left lower quadrant fluid collection measuring about 3.5 cm and tracking  about 7 cm.  This was concerning for an abscess.  She presented to the  office, where her exam shows increase in tenderness and is consistent  with CT findings.  She will be admitted for further management.  Of  note, her CT also revealed a small bowel thickening with proximal bowel  dilatation with which the radiologist for the  possibility of small bowel  malignancy.  This will also need evaluation.   PAST MEDICAL HISTORY:  1. Left breast cancer (1996), status post radiation and lumpectomy.  2. Right breast cancer (2001), status post lumpectomy, radiation, and      Aromasin therapy for 10 years.  3. Seizure disorder.  4. Hypertension.  5. Diverticulosis, with a history of diverticulitis (around 2006).  6. Colon polyps (March 2007).  7. Depression/anxiety.  8. Osteoporosis.  9. History of peptic ulcer disease.  10.Status post hiatal hernia repair (2002).  11.Status post cholecystectomy (1994).  12.Status post left neck mass removal, benign (1999).  13.History of cervix ulcer, treated (1970).  14.Status post right rotator cuff repair (July 2006).   CURRENT MEDICATIONS:  1. Dilantin 100 mg b.i.d.  2. Lamictal 100 mg daily, and 1/2 twice a day.  3. Xanax 0.25 mg b.i.d.  4. Paxil 20 mg daily.  5. Lisinopril/HCT 20/12.5 daily.  6. Multivitamin.  7. Vitamin E.  8. Citracal.  9. Celebrex 200 mg daily.  10.Cipro 500 mg  b.i.d. (day 6).  11.Flagyl 500 mg q.8 h. (day 6).   ALLERGIES:  No known drug allergies.   SOCIAL HISTORY:  She is married, with 2 children.  She is retired from a  job in receiving and inspection since 1998.  No tobacco, alcohol, or  drug use.   FAMILY HISTORY:  Her father died of an MI at 30.  He also had COPD and  rheumatoid arthritis.  Her mother has anxiety disorder and died of an MI  at 31.  She has one brother with thyroid cancer, a sister who died of  leukemia, and three siblings who died during childhood.  She also has  another sister with thyroid cancer.  Her son and daughter are healthy.   REVIEW OF SYSTEMS:  All systems were reviewed with the patient and are  negative, except as in the HPI.   PHYSICAL EXAMINATION:  VITAL SIGNS:  Temperature 98.7, pulse 88, blood  pressure 122/90, respiratory rate 16, weight 161, which is stable.  GENERAL:  Comfortable-appearing, pleasant  white female in no apparent  distress when sitting.  HEENT:  No scleral icterus.  Pupils equal, round, and reactive to light.  Oropharynx moist.  NECK:  Supple, without lymphadenopathy, JVD, or carotid bruits.  HEART:  Regular rate and rhythm, without murmurs, rubs, or gallops.  LUNGS:  Clear to auscultation bilaterally.  ABDOMEN:  Soft, nondistended, with hypoactive bowel sounds.  She is  exquisitely tender in the left lower quadrant, with probable rebound and  some mild guarding.  No appreciable mass.  EXTREMITIES:  No clubbing, cyanosis, or edema.  SKIN:  No rash.   LABORATORY DATA:  CBC obtained in the office shows a white count of  10.5, which is stable from last week.  Hemoglobin 12.7, platelets 442.   STUDIES:  CT of the abdomen and pelvis without contrast - discussed with  [name] at [facility], shows a focal fluid accumulation in  the pelvic cul-de-sac around the sigmoid colon, concerning for pelvic  abscess.  It measures 3.5 cm and extends from the rectum to the uterus  of about 7-8 cm.  Also noted is small bowel thickening with proximal  dilatation, consistent with a partial small bowel obstruction which  appears chronic, concerning for possible small bowel malignancy,  intramural metastases.  Recommend further followup with small bowel  follow-through, PET scan, or direct imaging.   ASSESSMENT AND PLAN:  1. Probable diverticular abscess.  Will admit for IV antibiotics, IV      fluid hydration, and definitive therapy.  Will consult general      surgery to help with management.  Consider surgical drainage versus      percutaneous catheter drainage.  She has failed outpatient oral      antibiotics.  2. Possible partial small bowel obstruction with small bowel      thickening.  This will require further evaluation.  Perhaps on      follow-up CT for her abscess, we can use IV contrast to further      evaluate.  We will ask radiology to review her films and  suggest      additional evaluation, with possibilities to include small bowel      follow-through, direct enteroscopy, or small bowel capsule      endoscopy.  I do not think that this is the cause of her acute      presentation.  3. Hypertension.  We will continue her lisinopril, but hold her  diuretics for now.  Monitor her blood pressure closely.  4. History of bilateral breast cancer.  No prior evidence of disease.      This has been followed by oncology.  5. Diet n.p.o., pending surgical evaluation.  6. Deep venous thrombosis prophylaxis.  SCDs until surgical or      procedural plan is clarified, at which time Lovenox will be      started.      Kari Baars, M.D.  Electronically Signed     WS/MEDQ  D:  04/17/2007  T:  04/17/2007  Job:  [id number]   cc:   Lennis P. Darrold Span, M.D.  Wilhemina Bonito. Marina Goodell, MD  Georga Hacking, M.D.  Lorne Skeens. H[place]worth, M.D.

## 2010-12-20 NOTE — Assessment & Plan Note (Signed)
[facility]                         GASTROENTEROLOGY OFFICE NOTE   NAME:[name], [name]                     MRN:          [id number]  DATE:05/30/2007                            DOB:          March 15, 1935    REASON FOR CONSULTATION:  Abdominal pain and abnormal CT scan.   HISTORY:  This is a 76 year old white female with a history of  adenomatous colon polyps, gastroesophageal reflux disease complicated by  peptic stricture, prior Nissen fundoplication, hypertension,  hyperlipidemia, osteoarthritis, breast cancer x2, prior cholecystectomy,  tubal ligation, and questionable remote ulcerative colitis.  The patient  was last evaluated in the office on February 18, 2007 for a change in bowel  habits and dysphagia.  See that dictation for details.  Chronic lower  abdominal complaints were consistent with irritable bowel syndrome.  She  was prescribed a probiotic for two weeks and Bentyl as needed for  cramps.  She states that Bentyl has been helpful.  She requests an  additional prescription.  She was also complaining of recurrent  dysphagia and underwent upper endoscopy on March 04, 2007.  Distal  esophageal stricture was dilated with a 54 French Maloney dilator.  Prior evidence of reflux surgery was evident.  No other upper GI  abnormalities.  Post dilation, her dysphagia has resolved.   In September, the patient developed problems with acute diverticulitis.  She was treated appropriately by Dr. Clelia Croft.  There was an initial CT scan  which showed abnormal appearance of the mid small intestine on a  noncontrast CT.  This was felt not to relate to her problems with  diverticulitis.  To clarify the issue, CT enterography was performed and  returned showing no abnormality of the small bowel.  She recovered from  her diverticulitis and was discharged home on April 19, 2007.  Unfortunately, she was readmitted to the hospital on May 08, 2007  with a deep venous  thrombosis of the left lower extremity.  She is now  on Coumadin therapy.   Currently, she is feeling well.  No further problems with abdominal  pain.  Her chronic irritable bowel complaints continue.  No heartburn or  dysphagia.   CURRENT MEDICATIONS:  Dilantin, Lamictal, alprazolam, multivitamin,  vitamin E, vitamin C, Caltrate, Zoloft, and lisinopril/HCTZ.   PHYSICAL EXAMINATION:  A well-appearing female in no acute distress.  Blood pressure is 112/60, heart rate 80, weight is 154 pounds.  HEENT:  Sclerae are anicteric.  Conjunctivae are pink.  Oral mucosa is  intact.  No adenopathy.  LUNGS:  Clear to auscultation and percussion.  HEART:  Regular without murmur.  ABDOMEN:  Mildly obese and soft without tenderness, mass, or hernia.  Good bowel sounds heard.  EXTREMITIES:  Edema in the left lower extremity.   IMPRESSION:  1. Recent problems with lower abdominal pain secondary to acute      diverticulitis, resolved.  2. Questionable abnormality of the small intestine on noncontrast      enhanced CT scan.  Subsequent CT enterography unremarkable.      Suspect artifact on initial exam.  3. Gastroesophageal reflux disease with peptic stricture.  Currently      asymptomatic post dilation.  4. Status post Nissen fundoplication.  5. History of colon polyps.  Last colonoscopy in March, 2007.  6. Irritable bowel syndrome.  7. Multiple other medical problems.   RECOMMENDATIONS:  1. Refill Bentyl 10 mg 1-2 every 4-6 hours as needed for cramps.  2. Follow up as needed for recurrent dysphagia.  3. Surveillance colonoscopy in 2012.  4. Resume general medical care with Dr. Clelia Croft.     Wilhemina Bonito. Marina Goodell, MD  Electronically Signed    JNP/MedQ  DD: 05/30/2007  DT: 05/31/2007  Job #: [id number]

## 2010-12-20 NOTE — Consult Note (Signed)
[name], [name]              ACCOUNT NO.:  [id number]   MEDICAL RECORD NO.:  [id number]          PATIENT TYPE:  INP   LOCATION:  5733                         FACILITY:  [facility]   PHYSICIAN:  Di Kindle. Edilia Bo, M.D.DATE OF BIRTH:  [birth year]   DATE OF CONSULTATION:  05/10/2007  DATE OF DISCHARGE:                                 CONSULTATION   REASON FOR CONSULTATION:  Left lower extremity DVT.   HISTORY:  This is a pleasant 76 year old woman who had recently been  admitted at [facility] with diverticulitis.  She was admitted on  April 16, 2007 and discharged on April 19, 2007.  She was treated  with IV antibiotics.  After discharge, she later developed some swelling  in her left lower extremity and then some left calf pain.  This prompted  a venous duplex scan which was done at [facility]  on May 08, 2007.  This showed no evidence of DVT on the right side,  and on the left side, there was DVT involving the external iliac vein  extending into the common femoral vein.  There was also some clot in the  posterior tibial and peroneal veins.  She was admitted for IV heparin  and Coumadin therapy, and vascular surgery was consulted for further  evaluation of her left lower extremity DVT, given significant swelling  in the left leg.   The patient is unaware of any previous history of DVT.  She does state  that she had some phlebitis after some breast surgery back in 1996 but  cannot remember the details of this.  She is unaware of any family  history of DVT.  Her risk factors for DVT include history of breast  cancer.  In addition, she was hospitalized with limited activity during  her episode of diverticulitis, although she was covered with Lovenox  during that admission.   PAST MEDICAL HISTORY:  1. Hypertension.  2. History of a seizure disorder.  3. History of anemia.  4. Diverticulitis.   She denies any history of diabetes,  hypercholesterolemia, history of  previous myocardial infarction, or history of congestive heart failure.   SOCIAL HISTORY:  She is married and has two children.  She does not use  tobacco.   FAMILY HISTORY:  There is no history of premature cardiovascular  disease.   REVIEW OF SYSTEMS:  She had no recent chest pain, chest pressure,  palpitations or arrhythmias.  She had no pleuritic chest pain.  She has  no claudication or history of rest pain, nonhealing ulcers.  She has had  her recent episode of diverticulitis.  She has had no recent fevers  tachypnea.  Review of systems is otherwise unremarkable and is  documented in her admission history and physical.   PHYSICAL EXAMINATION:  VITAL SIGNS:  Temperature is 98.1, heart rate is  88, blood pressure 145/72.  NECK:  I do not detect any carotid bruits.  LUNGS:  Clear bilaterally to auscultation.  CARDIAC:  She has a regular rate and rhythm.  ABDOMEN:  Soft and nontender.  She has palpable femoral pulses.  I  cannot palpate pedal pulses although she does have good monophasic  Doppler signals in the left foot with monophasic Doppler signals in the  right foot and a biphasic posterior tibial signal on the right.  There  is moderate left lower extremity swelling up to the groin.   This patient presents with an extensive DVT of the left lower extremity.  I agree with the plans for heparin and Coumadin, and I think she will  probably need 6 months of Coumadin.  At that time, her venous duplex  scan can be repeated to decide if she might potentially benefit from a  longer course of Coumadin therapy.  I agree that I do not think she is  an ideal candidate for thrombolysis given her recent diverticular  abscess.  We have discussed the importance of leg elevation to prevent  progressive swelling and arterial compromise.  Currently, she has  excellent arterial flow and the foot is well-perfused.  I will be happy  to see her back if any new  problems arise.      Di Kindle. Edilia Bo, M.D.  Electronically Signed     CSD/MEDQ  D:  05/10/2007  T:  05/12/2007  Job:  [id number]

## 2010-12-20 NOTE — Consult Note (Signed)
[name], [name]              ACCOUNT NO.:  [id number]   MEDICAL RECORD NO.:  [id number]          PATIENT TYPE:  INP   LOCATION:  5012                         FACILITY:  [facility]   PHYSICIAN:  Sharlet Salina T. Hoxworth, M.D.DATE OF BIRTH:  [birth year]   DATE OF CONSULTATION:  04/16/2007  DATE OF DISCHARGE:                                 CONSULTATION   CHIEF COMPLAINT:  Abdominal pain and nausea.   HISTORY OF PRESENT ILLNESS:  I was asked by Dr. Clelia Croft to evaluate [name].  She is a pleasant 76 year old white female who about 2 weeks  ago had the gradual onset of lower abdominal pain.  She first noticed  this when getting up in the morning when her feet hit the floor with  jarring.  There was pain.  This became quite a bit worse over the next  couple of days, with fairly constant pain in her left lower quadrant  that was associated mainly with moving.  She had some nausea and some  subjective fever at that time as well.  These symptoms persisted for  about a week, and then she saw Dr. Clelia Croft as an outpatient 1 week ago.  At  that time, she was started on oral Cipro and Flagyl for presumed  diverticulitis.  The patient states she initially felt somewhat improved  on the antibiotics, although the pain did not resolve.  However, over  the last 3-4 days she has again experienced increasing pain across her  lower abdomen, worse on the left than the right.  This is associated  again with moving.  She has had some continued nausea and rare vomiting  as well.  Her bowel movements have been loose and urgent, but this has  been a problem for her over a number of months.  She has been evaluated  by GI at [facility] by Dr. Marina Goodell in July 2008 and felt to have IBS.  She  describes constant pain worse with motion across her lower abdomen,  greater on the left side.  She has a history of previous episodes of  diverticulitis diagnosed clinically, treated as an outpatient with oral  antibiotics on at least  3-4 occasions over the last several years.  She  has had colonoscopy in the past, most recently about a year ago by Dr.  Corinda Gubler, with findings of diverticulosis and diminutive polyps.  She has  a history of colon polyps.  There is a remote history of hospitalization  in the 1980s for colitis, possible ulcerative colitis, but no further  problems from this subsequently.  She has no urinary symptoms.  No  vaginal discharge or bleeding.   PAST MEDICAL HISTORY:   PREVIOUS SURGERY:  1. Laparoscopic Nissen fundoplication for chronic reflux by Dr.      Luretha Murphy.  She has had esophageal strictures dilated prior to      this.  2. She has had bilateral breast cancer with lumpectomy on the left,      1996, by Dr. Maple Hudson, with radiation and hormone therapy, and      subsequent right breast lumpectomy, radiation, and  hormone therapy      in 2003.  3. She has also had cholecystectomy.  4. Rotator cuff surgery.   MEDICAL HISTORY:  1. She is followed for seizure disorder.  2. She has history of borderline hypertension.  3. Osteoporosis.  4. History of GERD.   CURRENT MEDICATIONS:  1. Dilantin 100 mg twice daily, with 3 daily on Wednesday and Sunday.  2. Lamictal 50 mg b.i.d.  3. Alprazolam 0.125 mg q.i.d. p.r.n.   ALLERGIES:  SULFA.   SOCIAL HISTORY:  She is married.  Retired.  Does not smoke cigarettes or  drink alcohol.   FAMILY HISTORY:  Noncontributory.   REVIEW OF SYSTEMS:  GENERAL:  Positive for some subjective fever with  this illness.  Weight is up recently.  HEENT:  Denies vision, hearing,  or swallowing problems.  RESPIRATORY:  Denies shortness breath, cough,  wheezing, or history of respiratory problems.  CARDIAC:  Denies chest  pain, palpitations, swelling, or history of heart disease.  ABDOMEN/GI:  As above.  GU:  As above.  MUSCULOSKELETAL:  She has some chronic joint  pain. She has cramping in her calves at night.  NEUROLOGIC:  No recent  seizures, syncope,  numbness, or weakness.   PHYSICAL EXAMINATION:  VITAL SIGNS:  Temperature is 974, pulse 104,  respirations 16, blood pressure 132/84.  GENERAL:  She is a mildly obese white female, in no acute distress.  SKIN:  Warm and dry.  No rash or infection.  HEENT:  No palpable mass or thyromegaly.  Sclerae anicteric.  LYMPH NODES:  No cervical, subclavicular, or axillary nodes palpable.  BREASTS:  Bilateral lumpectomies.  No mass, skin changes, or evidence of  recurrence locally.  LUNGS:  Clear without wheezing or increased work of breathing.  CARDIAC:  Regular rate and rhythm.  No murmurs.  No edema.  Peripheral  pulses intact.  ABDOMEN:  Mildly obese.  Well-healed laparoscopic incisions.  No  hernias.  Bowel sounds are present, slightly high pitched.  There is  tenderness across the lower abdomen, with guarding, more on the left  than the right.  No palpable masses or hepatosplenomegaly.  The upper  abdomen is relatively soft and nontender.  No organomegaly.  EXTREMITIES:  No joint swelling or deformity.  NEUROLOGIC:  Alert, oriented.  Motor and sensory exams grossly normal.   LABORATORY:  From Dr. Alver Fisher office today, shows white count 10.5,  hemoglobin 12.7 normal indices, platelets 442.  Electrolytes, BUN,  creatinine, glucose, LFTs all normal.   CT scan of the abdomen pelvis was obtained today at [facility].  This shows  focal fluid accumulation, 3.5 cm, adjacent to the sigmoid colon,  consistent with a pericolonic abscess.  Diverticulosis is noted, without  severe diverticulitis.  Also noted are two areas of more proximal small  bowel wall thickening, with proximal dilatation, questioning a small  bowel tumor.   ASSESSMENT AND PLAN:  A 76 year old female with history of mild packs of  diverticulitis, and now an apparently more severe attack of  diverticulitis and possible pericolonic abscess.  She does not appear  severely ill, and certainly there is no indication for emergency  surgery.   I would agree with bowel rest and broad-spectrum antibiotics.  I would have interventional radiology review her CT scan to see if she  would be appropriate for percutaneous drainage of her pericolonic fluid  collection.   There are small bowel abnormalities questioned on her CT, with possible  partial obstruction.  These will require  further evaluation possibly  with small-bowel follow-through or capsule endoscopy.  This can be  postponed until her acute illness is improving.  Will follow with you.      Lorne Skeens. Hoxworth, M.D.  Electronically Signed     BTH/MEDQ  D:  04/16/2007  T:  04/17/2007  Job:  [id number]

## 2010-12-20 NOTE — H&P (Signed)
[name], [name]              ACCOUNT NO.:  [id number]   MEDICAL RECORD NO.:  [id number]          PATIENT TYPE:  INP   LOCATION:  5733                         FACILITY:  [facility]   PHYSICIAN:  Barry Dienes. Eloise Harman, M.D.DATE OF BIRTH:  03-Jun-1935   DATE OF ADMISSION:  05/08/2007  DATE OF DISCHARGE:                              HISTORY & PHYSICAL   CHIEF COMPLAINT:  Left lower extremity deep venous thrombosis.   PERTINENT FINDINGS:  The patient is a 76 year old white female with a  complaint of left lower extremity edema and pain for the past few days.  The pain is worse with walking.  For the past few days, she has been  elevating her left lower extremity when lying down and the edema is  somewhat less.  She has no history of deep venous thrombosis and no  family history of DVT or pulmonary embolism.  She also has not had any  recent leg injuries or any prolonged immobilization, other than a  September 9 through September 12 hospital admission for sigmoid  diverticulitis.  During that admission, she was treated with Lovenox and  air compression hose.  She was seen in our office yesterday for left  lower extremity edema, and despite the best efforts of staff, we had to  wait until today to have a Doppler ultrasound exam done of the left  lower extremity.  Today, she had a bilateral lower extremity DVT  ultrasound exam that showed no evidence of thrombus or thrombophlebitis  on the right and a left lower extremity DVT that was quite extensive, as  described below.  She is being admitted for further evaluation and  treatment.   PAST MEDICAL HISTORY:  1. Left breast cancer treated with lumpectomy and radiation, 1996.  2. Right breast cancer treated with lumpectomy, radiation, and      Aromasin treatment therapy, 2001.  3. Seizure disorder.  4. Hypertension.  5. Diverticulosis with a 2006 episode of diverticulitis.  6. Colon polyps noted in March 2007.  7. Gastroesophageal reflux  disease leading to Nissen fundoplication in      July 2008.  8. Distal esophageal stricture dilatation.  9. Depression and anxiety.  10.Osteoporosis.  11.Peptic ulcer disease.   MEDICATIONS PRIOR TO ADMISSION:  1. Dilantin 100 mg p.o. b.i.d.  2. Lamictal 100 mg daily and 50 mg twice daily.  3. Xanax 0.25 mg p.o. b.i.d.  4. Paxil 20 mg p.o. daily.  5. Lisinopril/hydrochlorothiazide 20/12.5 every a.m.  6. Multivitamin once daily.  7. Vitamin E.  8. Citracal D 600 twice daily.  9. Celebrex 200 mg daily.   ALLERGIES:  NO KNOWN DRUG ALLERGIES   PAST SURGICAL HISTORY:  1. Cholecystectomy, 1994.  2. Cervical ulcer, 1970.  3. Left neck mass removal, benign, 1999.  4. Nissen fundoplication, 2002.  5. July 2006, right rotator cuff repair.   SOCIAL HISTORY:  She is married with 2 children.  She is retired from a  job in receiving and inspection since 1998.  She has no history of  tobacco or alcohol abuse.   FAMILY HISTORY:  Her father died of  a myocardial infarction at age 30;  he also had COPD and rheumatoid arthritis.  Her mother had an anxiety  disorder and died of myocardial infarction at age 17.  A brother has  thyroid cancer.  A sister died from leukemia and she had 3 siblings who  died during childhood.  She has an additional sister with thyroid  cancer.  She has a son and daughter who are both healthy.   REVIEW OF SYSTEMS:  She had mild fever yesterday and has not had  significant change in her weight.  She has no change in vision, no chest  pain, palpitations, short shortness of breath, cough, constipation,  rectal bleeding, headaches, anxiety, or depression.  She has mild  bilateral hip pain and low back pain.   INITIAL PHYSICAL EXAM:  VITAL SIGNS:  Blood pressure 106/76, pulse 96,  respirations 20, temperature 97.8, weight 158 pounds.  GENERAL:  She is a mildly overweight white female who is in no apparent  distress while sitting upright in a chair.  HEENT:  Exam was  within normal limits.  NECK:  Supple and without jugular venous distention or carotid bruit.  CHEST:  Clear to auscultation.  HEART:  Had a regular rate and rhythm without significant murmur or  gallop.  ABDOMEN:  Normal bowel sounds and no hepatosplenomegaly.  There is mild  left lower quadrant tenderness without rebound.  EXTREMITIES:  Significant for 2+ left lower extremity edema.  The calf  circumferences at 10 cm distal to the tibial tuberosity were 33 cm on  the right and 37 cm on the left.  NEUROLOGICAL:  She is alert and well-oriented with no focal neurologic  deficits.  She limped secondary to left calf pain.   LABORATORY STUDIES:  Bilateral lower extremity Doppler ultrasound  examination showed the following:  1. Right lower extremity:  No evidence of thrombus or      thrombophlebitis.  2. Left lower extremity:  Positive for DVT in the external iliac vein      extending into the common femoral vein, excluding the superficial      femoral vein and popliteal veins, and continuing into the posterior      tibial and peroneal veins.   IMPRESSION AND PLAN:  1. Left lower extremity deep venous thrombosis:  She is clinically      stable; however, her clot does extend quite proximally, so she is      at moderate risk of pulmonary embolism.  Accordingly, she will be      hospitalized for initiation of heparin treatment followed by      transition to Coumadin treatment.  At her age, with first      presentation of a deep venous thrombosis, it is unlikely that she      has inherited hypercoagulable disorder.  Regardless, her management      with not be significantly altered by the diagnosis of an unlikely      inherited disorder such as protein S or protein C deficiency, or      factor V Leiden deficiency.  For now, we will hold on an extensive      workup for hypercoagulable disorder.  Her greatest risk for deep      venous thrombosis was the recent hospitalization with       immobilization due to diverticulitis, despite prophylactic Lovenox      treatment.  We will continue heparin intravenously per pharmacist      protocol.  2. History  of bilateral breast cancer:  Stable on her current regimen      with no evidence of metastases on followup.  3. Hypertension:  Well controlled on her current medical regimen.  4. Sigmoid diverticulitis:  Again, she is doing well following broad-      spectrum antibiotic treatment with no signs of abscess formation or      Clostridium difficile from her antibiotics.  Should she develop      fever, a workup would include testing for C. difficile colitis      should she develop diarrhea.  We will check an admission CBC as      well as CMET and PT/INR.  5. Osteoporosis:  Clinically stable on calcium with vitamin D      supplements.  6. Seizure disorder:  Stable on Dilantin with Lamictal.           ______________________________  Barry Dienes. Eloise Harman, M.D.     DGP/MEDQ  D:  05/08/2007  T:  05/09/2007  Job:  [id number]   cc:   Kari Baars, M.D.  Lennis P. Darrold Span, M.D.  Sherry A. Rosalio Macadamia, M.D.  Wilhemina Bonito. Marina Goodell, MD  Georga Hacking, M.D.

## 2010-12-20 NOTE — Discharge Summary (Signed)
[name] NO.:  [id number]   MEDICAL RECORD NO.:  [id number]          PATIENT TYPE:  INP   LOCATION:  5733                         FACILITY:  [facility]   PHYSICIAN:  Kari Baars, M.D.  DATE OF BIRTH:  Dec 19, 1934   DATE OF ADMISSION:  05/08/2007  DATE OF DISCHARGE:  05/12/2007                               DISCHARGE SUMMARY   DISCHARGE DIAGNOSES:  1. Acute left lower extremity DVT.  2. Left lower quadrant abdominal pain.  3. Chronic low back pain secondary to severe L4-L5 and L5-S1 facet      arthropathy.  4. Recent hospitalization for sigmoid diverticulitis with abscess,      resolved.  5. Anemia of chronic disease, stable.  6. History of left breast cancer (1996) status post radiation and      lumpectomy.  Status post right breast cancer (2001), status post      lumpectomy, radiation therapy, and Aromasin medicine therapy for 5      years.  7. Seizure disorder.  8. Hypertension.  9. Depression slash anxiety.  10.History of colon polyps (March 2007).  11.Osteoporosis.  12.History of peptic ulcer disease.  13.Status post hiatal hernia repair (2002).  14.Status post cholecystectomy (1994).  15.Status post left neck mass removal (benign, 1999).  16.History of cervical ulcer, treated (1978).  17.Status post right rotator cuff repair (July 2006).   DISCHARGE MEDICATIONS:  1. Lovenox 70 mg q.12 h  2. Coumadin 5 mg q.h.s. for a goal INR 2-3  3. Dilantin 100 mg b.i.d.  4. Lamictal 50 mg b.i.d.  5. Paxil 20 mg daily.  6. Lisinopril HCT 20/12.5 mg daily.  7. Citracal +D twice daily.  8. Multivitamin daily.  9. Xanax 0.25 mg q.8 h p.r.n. anxiety.  10.Colace 100 mg b.i.d. for stool softener.   HOSPITAL PROCEDURES:  CT of the abdomen and pelvis with contrast  following steroid premedication (October 2004).  No acute abdominal  abnormalities.  Inflammatory changes around the left femoral and iliac  veins consistent with clinical history of DVT,  likely acute.  Development of pelvic pain venous collaterals, likely secondary to DVT.  Colonic diverticulosis without diverticulitis or evidence of residual  abscess.  Post surgical changes at the gastroesophageal junction with  hiatal hernia.  Status post cholecystectomy.   HOSPITAL CONSULT:  Vascular Surgery - Dr. Waverly Ferrari   HISTORY OF PRESENT ILLNESS:  For full details, please see dictated  History and Physical by Dr. Jarold Motto.  Briefly, [name] is a 76-  year-old white female with a history of bilateral breast cancer (1996  and 2001) status post treatment with no evidence of disease, recent  hospitalization for diverticulitis with abscess, resolved, who presented  to the office on a September 30 with complaint of left lower extremity  swelling.  She reported that she had a significant increase in her  swelling at the end of the day associated with significant pain in the  left leg and left groin area.  She was scheduled for a lower extremity  Doppler which was performed on the day of admission and revealed left  lower extremity DVT extending from the external iliac vein into the  common femoral vein and posterior tibial and peroneal veins.  Given the  extensive nature of her clot burden and recent hospitalization for  diverticulitis with heme-positive stool, she was admitted for  anticoagulation therapy.   HOSPITAL COURSE:  The patient was admitted to a medical bed.  She was  placed on bedrest and her leg was elevated.  She was placed initially on  a heparin drip and Coumadin therapy was initiated.  She had a brief  hypotensive episode associated with dizziness that occurred overnight on  the at night of admission.  This rapidly resolved and her blood pressure  remained stable throughout the remainder of her hospital course.  With  this episode she was given significant fluid hydration which worsened  her left lower extremity swelling.  Her daughter was very  concerned  about the progression of the lower extremity edema and requested a  vascular surgery consult which was obtained.  Dr. Edilia Bo, from Vascular  Surgery, reviewed the history with the patient and agreed with  management with anticoagulation.  He did not feel that thrombolytic  therapy was advised given her recent diverticulitis and risk for  complications.   The patient continued to have significant left lower quadrant tenderness  after completion of her recent antibiotic course of her diverticulitis.  Given this tenderness, a CT of the abdomen and pelvis was again  performed with contrast.  She was premedicated with steroids due to her  prior contrast allergy.  This showed no significant intra-abdominal  source of her pain, but did show findings consistent with her DVT.  It  is likely that her abdominal tenderness is related to the inflammation  from this DVT.  What is not clear is whether her DVT may have been  present prior to the prior hospitalization or whether it is a resulting  of her being sedentary for the past several weeks with this illness.  She does not have any evidence of occult malignancy at this point.  She  will be treated for 6 months with anticoagulation with follow-up studies  to document resolution of her clot.   The patient did not have any episodes of chest pain or shortness of  breath to suggest PE.  She remained hemodynamically stable throughout  her hospitalization and is felt stable for discharge home.  She was  given instruction on Coumadin therapy.  Had a patient handout.  She was  able to self administer her Lovenox and was transitioned from heparin to  Lovenox on the day prior to discharge.  She will be discharged home to  continue Lovenox until her Coumadin is therapeutic with an INR of two to  three.  She will return to our office to have this checked in 2 days.   DISCHARGE INSTRUCTIONS:  She was instructed to call if she has  significant  increasing leg pain or swelling, fever, abdominal pain,  shortness of breath, or chest pain.   DISCHARGE DIET:  Cardiac prudent with a consistent vitamin K intake.   HOSPITAL FOLLOW-UP:  She will follow up with Wynne Dust, pharm D at  [facility] on Tuesday for an INR check.  She will  follow up with Dr. Clelia Croft in 2 weeks at [facility].   DISPOSITION:  To home.      Kari Baars, M.D.  Electronically Signed     WS/MEDQ  D:  05/12/2007  T:  05/13/2007  Job:  [id number]

## 2010-12-20 NOTE — Assessment & Plan Note (Signed)
[facility]                         GASTROENTEROLOGY OFFICE NOTE   NAME:HOLSCLAWKattia, Selley                     MRN:          [id number]  DATE:02/18/2007                            DOB:          [birth year]    OFFICE CONSULTATION NOTE   REASON FOR EVALUATION:  Change in bowel habits and dysphagia.   HISTORY:  This is a 76 year old white female with a history of  adenomatous colon polyps, gastroesophageal reflux disease complicated by  peptic stricture, Nissen fundoplication, hypertension, hyperlipidemia,  osteoarthritis, breast cancer x2, and cholecystectomy, tubal ligation,  and questionable remote ulcerative colitis.  The patient reports to me  she was hospitalized and diagnosed with ulcerative colitis in the 22s.  However, nothing to support this on colonoscopies over the last 10  years.  Furthermore, she is on no medications for inflammatory bowel  disease.  In any event, she reports at least an 20-month history of  postprandial urgency followed by loose stools.  She generally has 2 to 3  per day and it occurs with the first meal of the day between 10:00 in  the morning and 12 noon.  Symptoms have been worse over the past 6  months.  There is some associated nausea and cramps prior to defecating.  Defecating relieved these symptoms.  She has no nocturnal symptoms.  She  has had good appetite and reports weight gain.  No bleeding.  Her last  complete colonoscopy was performed with Dr. Corinda Gubler in March 2007.  This  revealed left-sided diverticulosis and a diminutive colon polyps, which  was destroyed.  Followup in 5 years recommend.  As far as reflux is  concerned, the patient has not needed proton pump inhibitor since her  surgery.  However, over the past few months, she has had recurrent  intermittent solid food dysphagia.   PAST MEDICAL HISTORY:  As above.   ALLERGIES:  SULFA.   MEDICATIONS:  1. Benicar 20 mg daily.  2. Dilantin 200 to 300  mg daily.  3. Lamictal 100 mg b.i.d.  4. Alprazolam 12.5 mg q.i.d.  5. Paxil 20 mg at night.  6. Multivitamin.  7. Vitamin E.  8. Vitamin C.  9. Caltrate.  10.Zoloft.   FAMILY HISTORY:  Negative for gastrointestinal malignancy.   SOCIAL HISTORY:  The patient is married with 2 children.  She lives with  her spouse.  She has a 12th grade education.  She is retired from [employer].  Does not smoke, or use alcohol.   REVIEW OF SYSTEMS:  Per diagnostic evaluation form.   PHYSICAL EXAM:  Well-appearing female in no acute distress.  Blood pressure 128/70, heart rate is 60 and regular, weight is 162  pounds.  HEENT:  Sclerae anicteric.  Conjunctivae pink.  Oral mucosa is intact.  No adenopathy.  LUNGS:  Clear.  HEART:  Regular.  ABDOMEN:  Soft without tenderness, mass, or hernia.  Good bowel sounds  heard.  EXTREMITIES:  Without edema.   IMPRESSION:  1. Chronic stable problems with postprandial cramping, urgency, and      loose stools, consistent with irritable bowel syndrome.  2. Gastroesophageal reflux disease status post Nissen fundoplication.      Now, with recurrent dysphagia likely due to recurrent stricture.  3. History of adenomatous colon polyps current surveillance up to      date.  4. Multiple general medical problems, including recurrent breast      cancer, currently stable.   RECOMMENDATIONS:  1. Probiotic align 1 daily for 2 weeks.  2. Bentyl p.r.n. cramps.  3. Upper endoscopy with esophageal dilation.  The nature of the      procedure as well as the risks, benefits, and alternatives were      reviewed.  She understood and agreed to proceed.     Wilhemina Bonito. Marina Goodell, MD  Electronically Signed    JNP/MedQ  DD: 02/18/2007  DT: 02/19/2007  Job #: [id number]   cc:   Lennis P. Darrold Span, M.D.  Kari Baars, M.D.

## 2010-12-23 NOTE — Procedures (Signed)
[place]  Patient:    CAYMAN, BROGDEN Visit Number: [id number] MRN: [id number]          Service Type: DSU Location: [facility] Attending Physician:  Janalyn Rouse Dictated by:   Vania Rea. Jarold Motto, M.D. [facility] Proc. Date: 06/13/01   CC:         Ulyess Mort, M.D. [facility]   Procedure Report  PROCEDURE:  Esophageal manometry.  FINDINGS: 1. Upper esophageal sphincter:  There appears to be normal coordination between    pharyngeal contraction and cricopharyngeal relaxation. 2. Lower esophageal sphincter:  Mean pressure is depressed at 10 mmHg with    normal relaxation with swallowing. 3. Motility:  A normally propagated peristaltic wave of normal amplitude    and duration throughout the length of the esophagus with dry swallows.    Mean amplitude contraction is 65 mmHg.  I see no evidence of an esophageal    motility disorder.  ASSESSMENT:  This esophageal manometry shows an incompetent lower esophageal sphincter consistent with gastroesophageal reflux disease.  There is no contraindication to fundoplication therapy from this manometry report. Dictated by:   Vania Rea. Jarold Motto, M.D. [facility] Attending Physician:  Janalyn Rouse DD:  06/14/01 TD:  06/15/01 Job: [id number]

## 2010-12-23 NOTE — Discharge Summary (Signed)
[name], OSLAND NO.:  [id number]   MEDICAL RECORD NO.:  [id number]          PATIENT TYPE:  INP   LOCATION:  5012                         FACILITY:  [facility]   PHYSICIAN:  Kari Baars, M.D.  DATE OF BIRTH:  1935/06/18   DATE OF ADMISSION:  04/16/2007  DATE OF DISCHARGE:  04/19/2007                               DISCHARGE SUMMARY   DISCHARGE DIAGNOSES:  1. Sigmoid diverticulitis with possible abscess, improving.  2. Anemia of chronic disease.  3. Heme-positive stool, likely secondary to #1.  4. Abnormal appearance on small bowel appearance on noncontrasted CT,      normal on CT enterography.  5. Hypertension.  6. History of left breast cancer (1996).  Status post radiation and      lumpectomy.  7. Right breast cancer (2001) status post lumpectomy, radiation      therapy, and Aromasin therapy for 5 years.  8. Seizure disorder.  9. Hypertension.  10.Diverticulosis with a history of diverticulitis (2006).  11.History of colon polyps (March 2007).  12.Depression/anxiety.  13.Osteoporosis.  14.History of peptic ulcer disease.  15.Status post hiatal hernia repair (2002).  16.Status post cholecystectomy (1994)  17.Status post left neck mass removal (benign, 1999).  18.History of cervical ulcer, treated (1978).  19.Status post right rotator cuff repair (July 2006).   DISCHARGE MEDICATIONS:  1. Augmentin 875 mg b.i.d. for 7 days.  2. Flagyl 500 mg q.8 h times 7 days.  3. Dilantin 100 mg b.i.d..  4. Lamictal 100 mg daily and 50 mg b.i.d. as before.  5. Xanax 0.25 mg b.i.d. p.r.n.  6. Zoloft 50 mg daily.  7. Lisinopril HCT 20/12.5 mg daily.  8. Celebrex 20 mg daily.  9. Citrucel b.i.d.  10.Multivitamin daily.   HOSPITAL PROCEDURES:  1. CT of the pelvis without contrast (September 10).  This was      performed prior to planned percutaneous aspiration/drainage.  This      showed significant decrease in pelvic fluid compared to prior exam.  Therefore, drainage was not attempted.  2. CT enteroscopy of abdomen and pelvis with contrast (September 12).      No acute process in the abdomen.  Moderate hiatal hernia.  3. Cholecystectomy without biliary ductal dilatation.  Lipomatous      hypertrophy of the interatrial septum.  Sigmoid colon      diverticulosis with possible adjacent edema which could relate to      resolving diverticulitis.   HOSPITAL CONSULTS:  General surgery - Dr. Glenna Fellows and Dr. Avel Peace.   HISTORY OF PRESENT ILLNESS:  For full details, please see dictated  history and physical.  Briefly, [name] is a 76 year old white  female with a history of bilateral breast cancer (1996 left, 2001 right)  status post radiation therapy and lumpectomy for each, history of  diverticulosis with a history of diverticulitis (around 2006) and  history of hiatal hernia surgical repair (2002) who presented to the  office on two occasions in one week for left lower quadrant pain.  She  was initially seen for abdominal pain on September 3 for with  a 2-day  history of severe left lower quadrant abdominal pain, nausea, and fever  to 101.3.  She was placed empirically on Cipro and Flagyl for presumed  diverticulitis and instructed to call if her pain persisted.  Initially,  she noted some improvement.  However, over the past 2 days prior to  admission, she noted significant increase in abdominal pain, nausea, and  diarrhea.  She called and was sent for a CT of the abdomen and pelvis  which was performed at [facility] and showed a left lower quadrant  fluid collection measuring 3.5 centimeters tracking about 7 cm  concerning for an abscess..  On exam, she was much more tender with  possible rebound and guarding, and she was admitted for further  management.  Of note, the CT also showed some small bowel thickening  with proximal bowel dilatation concerning for possible small bowel  malignancy.  She was admitted  for management of diverticular abscess and  abnormal CT findings.   HOSPITAL COURSE:  The patient was admitted to a medical bed.  Her  antibiotics were changed to IV Cipro and Flagyl.  With her initial dose  of Cipro, she developed some right arm itching and urticaria.  Therefore, this was transitioned to Unasyn IV, and she was continued on  IV Flagyl.  With this, she had significant improvement in her abdominal  pain within 24 hours.  Her outside CT was read by Radiology who did feel  that there was a small fluid collection.  Given this finding, a CT  guided aspiration/drainage catheter placement was ordered.  Upon review  of the CT, on the morning after admission, the fluid collection was  noted to be much smaller, consistent with improving diverticulitis.  Therefore, aspiration was not performed.  The patient was seen by  General Surgery on admission who did not feel that surgical intervention  was necessary.   With continued IV antibiotics.  The patient's left lower quadrant pain  significantly improved to the point that she was stable for discharge  home.   While the patient was inpatient, she also underwent evaluation of the CT  abnormalities revealing proximal jejunum small bowel thickening.  This  was reviewed.  The outside film was reviewed by the radiologist here who  felt that further evaluation was warranted.  General Surgery and Dr.  Yancey Flemings, with whom I had a discussion over the phone, recommended CT  enteropathy.  This was performed on September 12 following a steroid  premedication for prior contrast allergy.  The patient tolerated the  procedure well.  The CT did not show any findings of small bowel  abnormality and showed no acute process in the abdomen.  It did show  sigmoid colon diverticulosis with possible adjacent edema consistent  with resolving diverticulitis.   The patient was noted to be anemic with a hemoglobin of 10.4 on  admission.  Anemia profile  was consistent with anemia of chronic disease  with a ferritin of 69.  Of note, she was heme-positive felt secondary to  diverticulitis.  This will be monitored as an outpatient..   At this point, the patient is stable for discharge home with close  outpatient follow-up.   DISCHARGE LABORATORIES:  CBC on the day of discharge shows a white count  of 5.2, hemoglobin 9.9, platelets 375.  B-met is normal with sodium 141,  potassium 4.3, chloride 110, bicarb 23, BUN 8, creatinine 0.7, glucose  128.  Anemia profile shows a reticulocyte count  of 0.9%, iron 82,  percent sat 28%, TIBC 298, and ferritin 69.  Folic acid greater than 20.  B12 518.  Stool Hemoccult positive.   DISCHARGE INSTRUCTIONS:  The patient was instructed to call if she has  increasing abdominal pain, fever, or blood in her stools.   HOSPITAL FOLLOW-UP:  She will follow up with Dr. Clelia Croft in one week at  [facility].  Will recheck her CBC at that point..   DISPOSITION:  To home.      Kari Baars, M.D.  Electronically Signed     WS/MEDQ  D:  04/23/2007  T:  04/23/2007  Job:  [id number]   cc:   Adolph Pollack, M.D.  Wilhemina Bonito. Marina Goodell, MD  Georga Hacking, M.D.  Lennis P. Darrold Span, M.D.

## 2010-12-23 NOTE — Op Note (Signed)
[name]  Patient:    [name], [name]                     MRN: [id number] Proc. Date: 01/04/00 Adm. Date:  [id number] Disc. Date: [id number] Attending:  Janalyn Rouse CC:         Cordelia Pen A. Rosalio Macadamia, M.D.             Corwin Levins, M.D. [facility]                           Operative Report  PREOPERATIVE DIAGNOSIS:  Carcinoma of the right breast.  POSTOPERATIVE DIAGNOSIS:  Carcinoma of the right breast.  PROCEDURE:  Right partial mastectomy with needle localization and specimen mammography; blue dye injection; right sentinel lymph node biopsy.  SURGEON:  Rose Phi. Maple Hudson, M.D.  ANESTHESIA:  General.  INDICATION:  This patient had previous T1b, N0 carcinoma of her left breast. She had been treated four years ago with lumpectomy and radiation therapy, and she had been on raloxifene for osteoporosis.  Recent mammogram showed a new lesion in her right breast, and a needle core biopsy had shown a small infiltrating ductal carcinoma.  She is scheduled now for her definitive procedure.  DESCRIPTION OF PROCEDURE:  Prior to coming to the operating room, a needle localization procedure was carried out, and at the same time 1 mCi of technetium sulfur colloid was injected intradermally around the needle entry site.  After suitable general anesthesia was induced, the patient was placed in the supine position with the right arm extended on the arm board.  Lymphazurin blue 5 cc was then injected subareolarly and around the peritumoral area and the breast massaged for five minutes.  A curvilinear incision incorporating the previously placed wire centered at about the 10 oclock position of the right breast was carried out, and then we carried out a wide excision of the wire in the surrounding tissue, and you could feel a palpable nodule.  The specimen was oriented with sutures and then submitted to the radiologist for specimen mammography and then to  the pathologist for touch prep evaluation of the margins.  While that was being carried out, we made a short transverse axillary incision with dissection through the subcutaneous tissue to the clavipectoral fascia. Careful scanning with a _____ probe failed to reveal a hot spot, so I carefully dissected until I finally identified a blue and warm lymph node. Clearly this had the dye in it, and then it was hotter as we got it better exposed.  We removed that and submitted it as the sentinel lymph node.  There were no other palpable or visible lymph nodes.  Specimen mammography confirmed removal of the nodule, and touch prep evaluation of the specimen was clean.  In addition, sentinel lymph node turned out to be negative.  The axillary incision was closed with 3-0 Vicryl for the deeper tissue and subcuticular 4-0 Monocryl and Steri-Strips.  The partial mastectomy site was closed with subcuticular 4-0 Monocryl and Steri-Strips.  Dressings were then applied and the patient was transferred to the recovery room in satisfactory condition, having tolerated the procedure well. DD:  01/04/00 TD:  01/07/00 Job: [id number]

## 2010-12-23 NOTE — Discharge Summary (Signed)
[facility]  Patient:    BLONDIE, RIGGSBEE Visit Number: [id number] MRN: [id number]          Service Type: SUR Location: 3W 0344 01 Attending Physician:  Katha Cabal Dictated by:   [place] Daphine Deutscher, M.D. Admit Date:  08/02/2001 Discharge Date: 08/04/2001   CC:         Corwin Levins, M.D.  Ulyess Mort, M.D. 2 copies   Discharge Summary  ADMISSION DIAGNOSIS:  Gastroesophageal reflux disease and stricture with past history of breast cancer.  HOSPITAL COURSE:  Ms. Mountz is a 76 year old lady who has had long-standing reflux.  She was brought to the operating room on 08/02/01, and underwent repair of a hiatal hernia and laparoscopic Nissen fundoplication.  She tolerated this procedure well, and was ready for discharge on 08/04/01, which was the second postoperative day.  She had a swallow which showed a good wrap intact without evidence of leak.  She was instructed in dietary measures, and will be followed up in the office in two weeks. Dictated by:   [place] Daphine Deutscher, M.D. Attending Physician:  Katha Cabal DD:  08/22/01 TD:  08/23/01 Job: [id number]

## 2010-12-23 NOTE — Op Note (Signed)
[facility]  Patient:    [name], [name] Visit Number: [id number] MRN: [id number]          Service Type: SUR Location: 3W [id number] Attending Physician:  Katha Cabal Dictated by:   Thornton Park Daphine Deutscher, M.D. Proc. Date: 08/02/01 Admit Date:  08/02/2001   CC:         Corwin Levins, M.D. [facility]  Ulyess Mort, M.D. [facility]   Operative Report  CCS NUMBER:  [id number]  PREOPERATIVE DIAGNOSIS:  Longstanding gastroesophageal reflux disease with stricture and hiatal hernia.  POSTOPERATIVE DIAGNOSIS:  Longstanding gastroesophageal reflux disease with stricture and hiatal hernia.  PROCEDURE:  Laparoscopic Nissen fundoplication (three) with closure of the hiatal hernia (four posterior).  SURGEON:  Thornton Park. Daphine Deutscher, M.D.  ASSISTANT:  Ollen Gross. Vernell Morgans, M.D.  ANESTHESIA:  General endotracheal.  DESCRIPTION OF PROCEDURE:  The patient is a 76 year old lady who was taken to room #1 and given general anesthesia.  She is latex sensitive, so non-latex materials were used throughout the procedure.  After prepping with Betadine and draping sterilely, I made a longitudinal incision above the umbilicus, and through a pursestring suture inserted the Hasson cannula.  The abdomen was insufflated.  A 5 mm was placed in the upper midline through which the liver retractor was placed.  Two 10-11s were placed on the right side and one in the left upper quadrant.  The dissection began by taking down the gastrohepatic window and exposing the right crus.  I dissected up the right crus anteriorly.  I did take a picture of the hiatal hernia that was present which appeared significant.  I took this down as far as I could on the left side.  Realizing there was a fairly sizeable hiatal hernia, I went ahead then and began working up the greater curvature by taking down the short gastrics.  This I did with the harmonic scalpel.  There was one bleeder on the stomach that I  _______ control with the clip.  Otherwise, we marched our way up the stomach to the left crus. There, I dissected the left crus, and posteriorly found a lot of chronic membranous adhesions of the esophagus to the surrounding tissue suggesting longstanding reflux changes.  This was mobilized.  I then worked on the right side, and identified the vagus nerve which I dissected free.  It had a tendency to want to traverse a little more posteriorly, but I dissected it free and placed it anteriorly, and then worked beneath it.  I then had good visualization of the right and left crus or crura.  I then approximated those with four interrupted sutures using the Endostitch tied extracorporeally with multiple knots.  This approximately the crura nicely without being too tight and without actually cutting the muscle, just mainly good firm approximation was present.  Visually, the closure seemed to be adequate, and did not appear too snug.  Because the patient had recently had to undergo in hospital dilatation because we did not have any lighted bougies present, I elected not to pass any of the dilators that we presently have because of the degree of scarring and inflammation that was there, both on the endoscopic exam and what I was seeing visually.  The top of the cardia had been completely freed and was very mobile.  After closing the hiatus, I reached around behind and grasped the stomach, and pulled it over where it stayed easily.  I then invaginated the distal esophagus with the  stomach by using the shoe shine maneuver to ensure that I had contiguous portion of the stomach, and then suturing that wrap with three sutures using the Endostitch and intracorporeal knot.  These were tied and clips were placed on these.  Essentially no bleeding was present.  The wrap looked healthy.  Trocar sites were all injected with 0.5% Marcaine.  The umbilical wound was tied down under direct vision and was  secured.  The other ports were then withdrawn and the abdomen was deflated.  The patient seemed to tolerate the procedure well and was taken to the recovery room in satisfactory condition. Dictated by:   Thornton Park Daphine Deutscher, M.D. Attending Physician:  Katha Cabal DD:  08/02/01 TD:  08/03/01 Job: [id number]

## 2010-12-23 NOTE — Op Note (Signed)
[name], [name]              ACCOUNT NO.:  [id number]   MEDICAL RECORD NO.:  [id number]          PATIENT TYPE:  AMB   LOCATION:  DAY                          FACILITY:  [facility]   PHYSICIAN:  Jene Every, M.D.    DATE OF BIRTH:  10-Aug-1934   DATE OF PROCEDURE:  12/14/2004  DATE OF DISCHARGE:                                 OPERATIVE REPORT   PREOPERATIVE DIAGNOSIS:  Rotator cuff tear, left shoulder.   POSTOPERATIVE DIAGNOSIS:  Rotator cuff tear, left shoulder, impingement  syndrome.   OPERATION/PROCEDURE:  1.  Forearm/left shoulder subacromial decompression.  2.  Acromioplasty.  3.  Open repair of rotator cuff tear.   ANESTHESIA:  General.   SURGEON:  Jene Every, M.D.   ASSISTANT:  Roma Schanz, P.A.-C.   BRIEF HISTORY AND INDICATIONS:  This is a 76 year old with refractory  shoulder pain and cervical radiculopathy.  MRI indicating a full-thickness  tear in the supraspinatus.  Due to persistent pain in the shoulder, surgery  is indicated for repair and subacromial decompression.  Risks and benefits  discussed including bleeding, infection, suboptimal range of motion,  recurrent tear, __________ etc.   DESCRIPTION OF PROCEDURE:  With the patient in the supine, beach chair  position.  After induction of adequate general anesthesia and 1 g of Kefzol,  the left shoulder and upper extremity were prepped and draped in the usual  sterile fashion.  An incision was made in on the anterior aspect of Langer's  lines and the anterior aspect of the acromion.  Subcutaneous tissue was  dissected by electrocautery to achieve hemostasis.  The raphe between the  anterolateral heads of the deltoid were identified and divided  subperiosteally and elevated from anterior and anterolateral aspect of the  acromion.  Cobra retractor was placed and the CA ligament was divided,  hypertrophic bursa excised.  Spur off the anterior acromion was removed with  Matt Holmes rongeur and a high-speed  bur, converted to a type 1 acromion.  I  digitally lysed the adhesions in the subacromial space.  A full-thickness  longitudinal tear of the supraspinatus was noted.  This was debrided with a  15 blade and a Theatre manager.  Bone beneath it decorticated.  Bleeding tissue  was noted.  It was repaired side-to-side with #1 Vicryl interrupted figure-  of-eight sutures.  The remainder of the tendon was unremarkable with some  attenuation at the insertion of the rotator cuff.   The wound was copiously irrigated with good range of motion without tension  no the repair site.  No impingement.  It then repaired the raphe with #1  Vicryl figure-of-eight interrupted sutures on the top of the acromion,  through that.  subcutaneous tissue was reapproximated with 2-0 Vicryl  interrupted sutures.  Skin was reapproximated with 4-0  subcuticular Prolene.  The wound was reinforced with Steri-Strips.  Sterile  dressing applied, placed on an abduction pillow.  Extubated without  difficulty and transported to the recovery room in satisfactory condition.  The patient tolerated the procedure well.  No complications.  Minimal blood  loss.      JB/MEDQ  D:  12/14/2004  T:  12/14/2004  Job:  [id number]

## 2011-01-10 ENCOUNTER — Other Ambulatory Visit: Payer: Self-pay | Admitting: Oncology

## 2011-01-10 DIAGNOSIS — Z1231 Encounter for screening mammogram for malignant neoplasm of breast: Secondary | ICD-10-CM

## 2011-01-19 ENCOUNTER — Ambulatory Visit
Admission: RE | Admit: 2011-01-19 | Discharge: 2011-01-19 | Disposition: A | Discharge status: Home / Self Care | Payer: Medicare Other | Source: Ambulatory Visit | Attending: Oncology | Admitting: Oncology

## 2011-01-19 DIAGNOSIS — Z1231 Encounter for screening mammogram for malignant neoplasm of breast: Secondary | ICD-10-CM

## 2011-04-14 ENCOUNTER — Encounter: Payer: Self-pay | Admitting: Internal Medicine

## 2011-05-18 LAB — CBC
HCT: 27.6 — ABNORMAL LOW
HCT: 27.8 — ABNORMAL LOW
HCT: 28.4 — ABNORMAL LOW
HCT: 30 — ABNORMAL LOW
HCT: 31.1 — ABNORMAL LOW
HCT: 34 — ABNORMAL LOW
Hemoglobin: 10 — ABNORMAL LOW
Hemoglobin: 10.9 — ABNORMAL LOW
Hemoglobin: 11.6 — ABNORMAL LOW
Hemoglobin: 9.3 — ABNORMAL LOW
Hemoglobin: 9.4 — ABNORMAL LOW
Hemoglobin: 9.5 — ABNORMAL LOW
MCHC: 33.5
MCHC: 34
MCV: 92.4
MCV: 93.9
Platelets: 218
Platelets: 249
Platelets: 265
Platelets: 309
Platelets: 314
RBC: 3.43 — ABNORMAL LOW
RDW: 13.3
RDW: 13.4
RDW: 13.7
WBC: 10.6 — ABNORMAL HIGH
WBC: 10.8 — ABNORMAL HIGH
WBC: 9.5

## 2011-05-18 LAB — URINE CULTURE

## 2011-05-18 LAB — BASIC METABOLIC PANEL
BUN: 6
CO2: 23
CO2: 27
Calcium: 8.5
Calcium: 9
Creatinine, Ser: 0.99
GFR calc Af Amer: >60
GFR calc Af Amer: >60
GFR calc non Af Amer: >60
Glucose, Bld: 109 — ABNORMAL HIGH
Glucose, Bld: 134 — ABNORMAL HIGH
Potassium: 3.8
Potassium: 4.1
Sodium: 138
Sodium: 139
Sodium: 139

## 2011-05-18 LAB — COMPREHENSIVE METABOLIC PANEL
Albumin: 3.9
Alkaline Phosphatase: 106
BUN: 13
Creatinine, Ser: 0.98
Glucose, Bld: 108 — ABNORMAL HIGH
Total Bilirubin: 0.5
Total Protein: 7.9

## 2011-05-18 LAB — URINALYSIS, MICROSCOPIC ONLY
Bilirubin Urine: NEGATIVE
Glucose, UA: NEGATIVE
Hgb urine dipstick: NEGATIVE
Ketones, ur: NEGATIVE
Nitrite: NEGATIVE
Specific Gravity, Urine: 1.01
pH: 6.5

## 2011-05-18 LAB — DIFFERENTIAL
Eosinophils Relative: 1
Lymphocytes Relative: 16
Lymphs Abs: 1.7
Monocytes Absolute: 0.8 — ABNORMAL HIGH
Monocytes Relative: 7
Neutro Abs: 8 — ABNORMAL HIGH

## 2011-05-18 LAB — HEMOGLOBIN AND HEMATOCRIT, BLOOD: Hemoglobin: 9.9 — ABNORMAL LOW

## 2011-05-18 LAB — PROTIME-INR
INR: 0.9
INR: 1.1
INR: 1.3
INR: 1.8 — ABNORMAL HIGH
Prothrombin Time: 12.6
Prothrombin Time: 14.5

## 2011-05-18 LAB — HEPARIN LEVEL (UNFRACTIONATED): Heparin Unfractionated: >2 — ABNORMAL HIGH

## 2011-05-18 LAB — APTT
aPTT: 25
aPTT: >200

## 2011-05-19 LAB — BASIC METABOLIC PANEL
BUN: 12
BUN: 8
BUN: 9
CO2: 25
CO2: 27
Calcium: 8.3 — ABNORMAL LOW
Calcium: 8.7
Chloride: 114 — ABNORMAL HIGH
Creatinine, Ser: 0.72
Creatinine, Ser: 0.94
GFR calc Af Amer: >60
GFR calc non Af Amer: >60
GFR calc non Af Amer: >60
Glucose, Bld: 113 — ABNORMAL HIGH
Glucose, Bld: 128 — ABNORMAL HIGH
Potassium: 3.4 — ABNORMAL LOW
Sodium: 141
Sodium: 143

## 2011-05-19 LAB — RETICULOCYTES
RBC.: 3.72 — ABNORMAL LOW
Retic Count, Absolute: 33.5
Retic Ct Pct: 0.9

## 2011-05-19 LAB — CBC
HCT: 30.5 — ABNORMAL LOW
Hemoglobin: 10.4 — ABNORMAL LOW
MCHC: 34
MCV: 92.1
MCV: 92.1
Platelets: 375
RBC: 2.89 — ABNORMAL LOW
RBC: 3.31 — ABNORMAL LOW
RDW: 13.1
WBC: 7.8

## 2011-05-19 LAB — PROTIME-INR: Prothrombin Time: 13.5

## 2011-05-19 LAB — COMPREHENSIVE METABOLIC PANEL
ALT: 22
AST: 25
Alkaline Phosphatase: 88
CO2: 24
Calcium: 9.1
GFR calc Af Amer: >60
GFR calc non Af Amer: 60 — ABNORMAL LOW
Glucose, Bld: 124 — ABNORMAL HIGH
Potassium: 3.7
Sodium: 141

## 2011-05-19 LAB — FERRITIN: Ferritin: 69 (ref 10–291)

## 2011-05-19 LAB — DIFFERENTIAL
Basophils Relative: 1
Eosinophils Absolute: 0.2
Monocytes Relative: 8
Neutrophils Relative %: 41 — ABNORMAL LOW

## 2011-05-19 LAB — IRON AND TIBC: Iron: 82

## 2011-05-19 LAB — FOLATE: Folate: >20

## 2011-08-11 DIAGNOSIS — J309 Allergic rhinitis, unspecified: Secondary | ICD-10-CM | POA: Diagnosis not present

## 2011-09-04 DIAGNOSIS — J309 Allergic rhinitis, unspecified: Secondary | ICD-10-CM | POA: Diagnosis not present

## 2011-09-13 DIAGNOSIS — I4891 Unspecified atrial fibrillation: Secondary | ICD-10-CM | POA: Diagnosis not present

## 2011-09-13 DIAGNOSIS — I119 Hypertensive heart disease without heart failure: Secondary | ICD-10-CM | POA: Diagnosis not present

## 2011-09-13 DIAGNOSIS — I2510 Atherosclerotic heart disease of native coronary artery without angina pectoris: Secondary | ICD-10-CM | POA: Diagnosis not present

## 2011-09-13 DIAGNOSIS — Z7901 Long term (current) use of anticoagulants: Secondary | ICD-10-CM | POA: Diagnosis not present

## 2011-09-14 ENCOUNTER — Other Ambulatory Visit: Payer: Self-pay | Admitting: Neurology

## 2011-09-14 ENCOUNTER — Ambulatory Visit (HOSPITAL_COMMUNITY)
Admission: RE | Admit: 2011-09-14 | Discharge: 2011-09-14 | Disposition: A | Discharge status: Home / Self Care | Payer: Medicare Other | Source: Ambulatory Visit | Attending: Neurology | Admitting: Neurology

## 2011-09-14 DIAGNOSIS — I2699 Other pulmonary embolism without acute cor pulmonale: Secondary | ICD-10-CM | POA: Insufficient documentation

## 2011-09-14 DIAGNOSIS — C50919 Malignant neoplasm of unspecified site of unspecified female breast: Secondary | ICD-10-CM | POA: Insufficient documentation

## 2011-09-14 DIAGNOSIS — R569 Unspecified convulsions: Secondary | ICD-10-CM

## 2011-09-14 DIAGNOSIS — F32A Depression, unspecified: Secondary | ICD-10-CM | POA: Insufficient documentation

## 2011-09-14 DIAGNOSIS — F329 Major depressive disorder, single episode, unspecified: Secondary | ICD-10-CM | POA: Insufficient documentation

## 2011-09-14 DIAGNOSIS — Z9181 History of falling: Secondary | ICD-10-CM | POA: Diagnosis not present

## 2011-09-14 DIAGNOSIS — S0990XA Unspecified injury of head, initial encounter: Secondary | ICD-10-CM | POA: Insufficient documentation

## 2011-09-14 DIAGNOSIS — I82409 Acute embolism and thrombosis of unspecified deep veins of unspecified lower extremity: Secondary | ICD-10-CM

## 2011-09-14 DIAGNOSIS — Z7901 Long term (current) use of anticoagulants: Secondary | ICD-10-CM | POA: Insufficient documentation

## 2011-09-14 DIAGNOSIS — R413 Other amnesia: Secondary | ICD-10-CM | POA: Insufficient documentation

## 2011-09-14 DIAGNOSIS — I639 Cerebral infarction, unspecified: Secondary | ICD-10-CM | POA: Insufficient documentation

## 2011-09-14 DIAGNOSIS — W19XXXA Unspecified fall, initial encounter: Secondary | ICD-10-CM | POA: Insufficient documentation

## 2011-09-18 DIAGNOSIS — S5000XA Contusion of unspecified elbow, initial encounter: Secondary | ICD-10-CM | POA: Diagnosis not present

## 2011-09-18 DIAGNOSIS — Z7901 Long term (current) use of anticoagulants: Secondary | ICD-10-CM | POA: Diagnosis not present

## 2011-09-18 DIAGNOSIS — I82409 Acute embolism and thrombosis of unspecified deep veins of unspecified lower extremity: Secondary | ICD-10-CM | POA: Diagnosis not present

## 2011-09-18 DIAGNOSIS — I10 Essential (primary) hypertension: Secondary | ICD-10-CM | POA: Diagnosis not present

## 2011-09-22 DIAGNOSIS — J309 Allergic rhinitis, unspecified: Secondary | ICD-10-CM | POA: Diagnosis not present

## 2011-09-28 DIAGNOSIS — Z7901 Long term (current) use of anticoagulants: Secondary | ICD-10-CM | POA: Diagnosis not present

## 2011-09-28 DIAGNOSIS — I82409 Acute embolism and thrombosis of unspecified deep veins of unspecified lower extremity: Secondary | ICD-10-CM | POA: Diagnosis not present

## 2011-10-05 DIAGNOSIS — J309 Allergic rhinitis, unspecified: Secondary | ICD-10-CM | POA: Diagnosis not present

## 2011-10-06 DIAGNOSIS — I10 Essential (primary) hypertension: Secondary | ICD-10-CM | POA: Diagnosis not present

## 2011-10-06 DIAGNOSIS — S0003XA Contusion of scalp, initial encounter: Secondary | ICD-10-CM | POA: Diagnosis not present

## 2011-10-06 DIAGNOSIS — S5000XA Contusion of unspecified elbow, initial encounter: Secondary | ICD-10-CM | POA: Diagnosis not present

## 2011-10-06 DIAGNOSIS — Z7901 Long term (current) use of anticoagulants: Secondary | ICD-10-CM | POA: Diagnosis not present

## 2011-10-06 DIAGNOSIS — S1093XA Contusion of unspecified part of neck, initial encounter: Secondary | ICD-10-CM | POA: Diagnosis not present

## 2011-10-10 DIAGNOSIS — Z01419 Encounter for gynecological examination (general) (routine) without abnormal findings: Secondary | ICD-10-CM | POA: Diagnosis not present

## 2011-10-10 DIAGNOSIS — N951 Menopausal and female climacteric states: Secondary | ICD-10-CM | POA: Diagnosis not present

## 2011-10-10 DIAGNOSIS — Z124 Encounter for screening for malignant neoplasm of cervix: Secondary | ICD-10-CM | POA: Diagnosis not present

## 2011-10-10 DIAGNOSIS — Z853 Personal history of malignant neoplasm of breast: Secondary | ICD-10-CM | POA: Diagnosis not present

## 2011-10-12 ENCOUNTER — Other Ambulatory Visit: Payer: Self-pay | Admitting: Neurology

## 2011-10-12 ENCOUNTER — Ambulatory Visit
Admission: RE | Admit: 2011-10-12 | Discharge: 2011-10-12 | Disposition: A | Discharge status: Home / Self Care | Payer: Medicare Other | Source: Ambulatory Visit | Attending: Neurology | Admitting: Neurology

## 2011-10-12 DIAGNOSIS — Z7901 Long term (current) use of anticoagulants: Secondary | ICD-10-CM | POA: Diagnosis not present

## 2011-10-12 DIAGNOSIS — S0990XA Unspecified injury of head, initial encounter: Secondary | ICD-10-CM | POA: Diagnosis not present

## 2011-10-12 DIAGNOSIS — M542 Cervicalgia: Secondary | ICD-10-CM | POA: Diagnosis not present

## 2011-10-12 DIAGNOSIS — I82409 Acute embolism and thrombosis of unspecified deep veins of unspecified lower extremity: Secondary | ICD-10-CM | POA: Diagnosis not present

## 2011-10-12 DIAGNOSIS — R51 Headache: Secondary | ICD-10-CM | POA: Diagnosis not present

## 2011-10-18 DIAGNOSIS — J309 Allergic rhinitis, unspecified: Secondary | ICD-10-CM | POA: Diagnosis not present

## 2011-10-26 DIAGNOSIS — J309 Allergic rhinitis, unspecified: Secondary | ICD-10-CM | POA: Diagnosis not present

## 2011-11-07 DIAGNOSIS — M810 Age-related osteoporosis without current pathological fracture: Secondary | ICD-10-CM | POA: Diagnosis not present

## 2011-11-07 DIAGNOSIS — Z7901 Long term (current) use of anticoagulants: Secondary | ICD-10-CM | POA: Diagnosis not present

## 2011-11-07 DIAGNOSIS — I82409 Acute embolism and thrombosis of unspecified deep veins of unspecified lower extremity: Secondary | ICD-10-CM | POA: Diagnosis not present

## 2011-11-13 DIAGNOSIS — J309 Allergic rhinitis, unspecified: Secondary | ICD-10-CM | POA: Diagnosis not present

## 2011-11-22 DIAGNOSIS — J309 Allergic rhinitis, unspecified: Secondary | ICD-10-CM | POA: Diagnosis not present

## 2011-12-01 ENCOUNTER — Other Ambulatory Visit: Payer: Self-pay | Admitting: Neurology

## 2011-12-01 DIAGNOSIS — J309 Allergic rhinitis, unspecified: Secondary | ICD-10-CM | POA: Diagnosis not present

## 2011-12-01 DIAGNOSIS — S0990XA Unspecified injury of head, initial encounter: Secondary | ICD-10-CM

## 2011-12-05 ENCOUNTER — Ambulatory Visit
Admission: RE | Admit: 2011-12-05 | Discharge: 2011-12-05 | Disposition: A | Discharge status: Home / Self Care | Payer: Medicare Other | Source: Ambulatory Visit | Attending: Neurology | Admitting: Neurology

## 2011-12-05 DIAGNOSIS — S0990XA Unspecified injury of head, initial encounter: Secondary | ICD-10-CM | POA: Diagnosis not present

## 2011-12-05 DIAGNOSIS — F0781 Postconcussional syndrome: Secondary | ICD-10-CM | POA: Diagnosis not present

## 2011-12-11 ENCOUNTER — Other Ambulatory Visit: Payer: Self-pay | Admitting: Neurology

## 2011-12-11 ENCOUNTER — Ambulatory Visit
Admission: RE | Admit: 2011-12-11 | Discharge: 2011-12-11 | Disposition: A | Discharge status: Home / Self Care | Payer: Medicare Other | Source: Ambulatory Visit | Attending: Neurology | Admitting: Neurology

## 2011-12-11 DIAGNOSIS — J3089 Other allergic rhinitis: Secondary | ICD-10-CM | POA: Diagnosis not present

## 2011-12-11 DIAGNOSIS — R209 Unspecified disturbances of skin sensation: Secondary | ICD-10-CM | POA: Diagnosis not present

## 2011-12-11 DIAGNOSIS — J309 Allergic rhinitis, unspecified: Secondary | ICD-10-CM | POA: Diagnosis not present

## 2011-12-11 DIAGNOSIS — J3081 Allergic rhinitis due to animal (cat) (dog) hair and dander: Secondary | ICD-10-CM | POA: Diagnosis not present

## 2011-12-11 DIAGNOSIS — S0990XA Unspecified injury of head, initial encounter: Secondary | ICD-10-CM

## 2011-12-11 DIAGNOSIS — J301 Allergic rhinitis due to pollen: Secondary | ICD-10-CM | POA: Diagnosis not present

## 2011-12-11 DIAGNOSIS — S199XXA Unspecified injury of neck, initial encounter: Secondary | ICD-10-CM | POA: Diagnosis not present

## 2011-12-12 DIAGNOSIS — I82409 Acute embolism and thrombosis of unspecified deep veins of unspecified lower extremity: Secondary | ICD-10-CM | POA: Diagnosis not present

## 2011-12-12 DIAGNOSIS — Z79899 Other long term (current) drug therapy: Secondary | ICD-10-CM | POA: Diagnosis not present

## 2011-12-12 DIAGNOSIS — Z7901 Long term (current) use of anticoagulants: Secondary | ICD-10-CM | POA: Diagnosis not present

## 2011-12-12 DIAGNOSIS — M810 Age-related osteoporosis without current pathological fracture: Secondary | ICD-10-CM | POA: Diagnosis not present

## 2011-12-27 ENCOUNTER — Encounter (HOSPITAL_COMMUNITY): Admission: RE | Admit: 2011-12-27 | Payer: Medicare Other | Source: Ambulatory Visit

## 2012-01-05 DIAGNOSIS — J309 Allergic rhinitis, unspecified: Secondary | ICD-10-CM | POA: Diagnosis not present

## 2012-01-09 ENCOUNTER — Encounter (HOSPITAL_COMMUNITY): Admission: RE | Admit: 2012-01-09 | Payer: Medicare Other | Source: Ambulatory Visit

## 2012-01-12 DIAGNOSIS — F0781 Postconcussional syndrome: Secondary | ICD-10-CM | POA: Diagnosis not present

## 2012-01-12 DIAGNOSIS — J309 Allergic rhinitis, unspecified: Secondary | ICD-10-CM | POA: Diagnosis not present

## 2012-01-17 DIAGNOSIS — I82409 Acute embolism and thrombosis of unspecified deep veins of unspecified lower extremity: Secondary | ICD-10-CM | POA: Diagnosis not present

## 2012-01-17 DIAGNOSIS — Z7901 Long term (current) use of anticoagulants: Secondary | ICD-10-CM | POA: Diagnosis not present

## 2012-01-19 DIAGNOSIS — J309 Allergic rhinitis, unspecified: Secondary | ICD-10-CM | POA: Diagnosis not present

## 2012-01-23 DIAGNOSIS — E785 Hyperlipidemia, unspecified: Secondary | ICD-10-CM | POA: Diagnosis not present

## 2012-01-23 DIAGNOSIS — R82998 Other abnormal findings in urine: Secondary | ICD-10-CM | POA: Diagnosis not present

## 2012-01-23 DIAGNOSIS — I10 Essential (primary) hypertension: Secondary | ICD-10-CM | POA: Diagnosis not present

## 2012-01-23 DIAGNOSIS — R7301 Impaired fasting glucose: Secondary | ICD-10-CM | POA: Diagnosis not present

## 2012-01-23 DIAGNOSIS — M810 Age-related osteoporosis without current pathological fracture: Secondary | ICD-10-CM | POA: Diagnosis not present

## 2012-01-26 DIAGNOSIS — J309 Allergic rhinitis, unspecified: Secondary | ICD-10-CM | POA: Diagnosis not present

## 2012-01-30 DIAGNOSIS — I2510 Atherosclerotic heart disease of native coronary artery without angina pectoris: Secondary | ICD-10-CM | POA: Diagnosis not present

## 2012-01-30 DIAGNOSIS — R0789 Other chest pain: Secondary | ICD-10-CM | POA: Diagnosis not present

## 2012-01-30 DIAGNOSIS — Z0000 Encounter for general adult medical examination without abnormal findings: Secondary | ICD-10-CM | POA: Diagnosis not present

## 2012-01-30 DIAGNOSIS — I10 Essential (primary) hypertension: Secondary | ICD-10-CM | POA: Diagnosis not present

## 2012-01-30 DIAGNOSIS — D649 Anemia, unspecified: Secondary | ICD-10-CM | POA: Diagnosis not present

## 2012-01-30 DIAGNOSIS — R569 Unspecified convulsions: Secondary | ICD-10-CM | POA: Diagnosis not present

## 2012-02-02 DIAGNOSIS — J309 Allergic rhinitis, unspecified: Secondary | ICD-10-CM | POA: Diagnosis not present

## 2012-02-07 DIAGNOSIS — J309 Allergic rhinitis, unspecified: Secondary | ICD-10-CM | POA: Diagnosis not present

## 2012-02-12 MED ORDER — SODIUM CHLORIDE 0.9 % IV SOLN: INTRAVENOUS | Status: DC

## 2012-02-12 MED ORDER — ZOLEDRONIC ACID 5 MG/100ML IV SOLN: 5.0000 mg | Freq: Once | INTRAVENOUS | Status: DC

## 2012-02-13 ENCOUNTER — Ambulatory Visit: Payer: Medicare Other | Attending: Internal Medicine | Admitting: Physical Therapy

## 2012-02-13 DIAGNOSIS — R269 Unspecified abnormalities of gait and mobility: Secondary | ICD-10-CM | POA: Insufficient documentation

## 2012-02-13 DIAGNOSIS — IMO0001 Reserved for inherently not codable concepts without codable children: Secondary | ICD-10-CM | POA: Insufficient documentation

## 2012-02-14 ENCOUNTER — Encounter (HOSPITAL_COMMUNITY)
Admission: RE | Admit: 2012-02-14 | Discharge: 2012-02-14 | Disposition: A | Discharge status: Home / Self Care | Payer: Medicare Other | Source: Ambulatory Visit | Attending: Internal Medicine | Admitting: Internal Medicine

## 2012-02-14 ENCOUNTER — Encounter (HOSPITAL_COMMUNITY): Payer: Self-pay

## 2012-02-14 DIAGNOSIS — M810 Age-related osteoporosis without current pathological fracture: Secondary | ICD-10-CM | POA: Diagnosis not present

## 2012-02-14 HISTORY — DX: Age-related osteoporosis without current pathological fracture: M81.0

## 2012-02-14 HISTORY — DX: Personal history of other venous thrombosis and embolism: Z86.718

## 2012-02-14 HISTORY — DX: Essential (primary) hypertension: I10

## 2012-02-14 HISTORY — DX: Pure hypercholesterolemia, unspecified: E78.00

## 2012-02-14 HISTORY — DX: Epilepsy, unspecified, not intractable, without status epilepticus: G40.909

## 2012-02-14 HISTORY — DX: Unspecified convulsions: R56.9

## 2012-02-14 MED ORDER — ZOLEDRONIC ACID 5 MG/100ML IV SOLN
5.0000 mg | Freq: Once | INTRAVENOUS | Status: AC
  Administered 2012-02-14: 5 mg via INTRAVENOUS
  Filled 2012-02-14: qty 100

## 2012-02-14 MED ORDER — SODIUM CHLORIDE 0.9 % IV SOLN
Freq: Once | INTRAVENOUS | Status: AC
  Administered 2012-02-14: 14:00:00 via INTRAVENOUS

## 2012-02-15 DIAGNOSIS — J309 Allergic rhinitis, unspecified: Secondary | ICD-10-CM | POA: Diagnosis not present

## 2012-02-20 ENCOUNTER — Encounter: Payer: Medicare Other | Admitting: Physical Therapy

## 2012-02-20 DIAGNOSIS — Z7901 Long term (current) use of anticoagulants: Secondary | ICD-10-CM | POA: Diagnosis not present

## 2012-02-20 DIAGNOSIS — I82409 Acute embolism and thrombosis of unspecified deep veins of unspecified lower extremity: Secondary | ICD-10-CM | POA: Diagnosis not present

## 2012-02-23 ENCOUNTER — Ambulatory Visit: Payer: Medicare Other | Admitting: Physical Therapy

## 2012-02-23 DIAGNOSIS — J309 Allergic rhinitis, unspecified: Secondary | ICD-10-CM | POA: Diagnosis not present

## 2012-02-26 ENCOUNTER — Ambulatory Visit: Payer: Medicare Other | Admitting: Physical Therapy

## 2012-02-28 ENCOUNTER — Ambulatory Visit: Payer: Medicare Other | Admitting: Physical Therapy

## 2012-03-05 ENCOUNTER — Ambulatory Visit: Payer: Medicare Other | Admitting: Physical Therapy

## 2012-03-08 ENCOUNTER — Ambulatory Visit: Payer: Medicare Other | Attending: Internal Medicine | Admitting: Physical Therapy

## 2012-03-08 DIAGNOSIS — J309 Allergic rhinitis, unspecified: Secondary | ICD-10-CM | POA: Diagnosis not present

## 2012-03-08 DIAGNOSIS — IMO0001 Reserved for inherently not codable concepts without codable children: Secondary | ICD-10-CM | POA: Diagnosis not present

## 2012-03-08 DIAGNOSIS — R269 Unspecified abnormalities of gait and mobility: Secondary | ICD-10-CM | POA: Diagnosis not present

## 2012-03-12 ENCOUNTER — Ambulatory Visit: Payer: Medicare Other | Admitting: Physical Therapy

## 2012-03-12 DIAGNOSIS — IMO0001 Reserved for inherently not codable concepts without codable children: Secondary | ICD-10-CM | POA: Diagnosis not present

## 2012-03-12 DIAGNOSIS — R269 Unspecified abnormalities of gait and mobility: Secondary | ICD-10-CM | POA: Diagnosis not present

## 2012-03-15 ENCOUNTER — Ambulatory Visit: Payer: Medicare Other | Admitting: Physical Therapy

## 2012-03-15 DIAGNOSIS — IMO0001 Reserved for inherently not codable concepts without codable children: Secondary | ICD-10-CM | POA: Diagnosis not present

## 2012-03-15 DIAGNOSIS — R269 Unspecified abnormalities of gait and mobility: Secondary | ICD-10-CM | POA: Diagnosis not present

## 2012-03-21 DIAGNOSIS — I82409 Acute embolism and thrombosis of unspecified deep veins of unspecified lower extremity: Secondary | ICD-10-CM | POA: Diagnosis not present

## 2012-03-21 DIAGNOSIS — Z7901 Long term (current) use of anticoagulants: Secondary | ICD-10-CM | POA: Diagnosis not present

## 2012-03-21 DIAGNOSIS — J309 Allergic rhinitis, unspecified: Secondary | ICD-10-CM | POA: Diagnosis not present

## 2012-03-26 ENCOUNTER — Encounter: Payer: Medicare Other | Admitting: Physical Therapy

## 2012-03-29 ENCOUNTER — Encounter: Payer: Medicare Other | Admitting: Physical Therapy

## 2012-04-02 ENCOUNTER — Ambulatory Visit: Payer: Medicare Other | Admitting: Physical Therapy

## 2012-04-02 DIAGNOSIS — IMO0001 Reserved for inherently not codable concepts without codable children: Secondary | ICD-10-CM | POA: Diagnosis not present

## 2012-04-02 DIAGNOSIS — R269 Unspecified abnormalities of gait and mobility: Secondary | ICD-10-CM | POA: Diagnosis not present

## 2012-04-04 ENCOUNTER — Encounter: Payer: Medicare Other | Admitting: Physical Therapy

## 2012-04-09 ENCOUNTER — Encounter: Payer: Medicare Other | Admitting: Physical Therapy

## 2012-04-11 ENCOUNTER — Encounter: Payer: Medicare Other | Admitting: Physical Therapy

## 2012-04-11 DIAGNOSIS — J309 Allergic rhinitis, unspecified: Secondary | ICD-10-CM | POA: Diagnosis not present

## 2012-04-12 DIAGNOSIS — I10 Essential (primary) hypertension: Secondary | ICD-10-CM | POA: Diagnosis not present

## 2012-04-12 DIAGNOSIS — G500 Trigeminal neuralgia: Secondary | ICD-10-CM | POA: Diagnosis not present

## 2012-04-12 DIAGNOSIS — H9209 Otalgia, unspecified ear: Secondary | ICD-10-CM | POA: Diagnosis not present

## 2012-04-12 DIAGNOSIS — Z7901 Long term (current) use of anticoagulants: Secondary | ICD-10-CM | POA: Diagnosis not present

## 2012-04-16 ENCOUNTER — Encounter: Payer: Medicare Other | Admitting: Physical Therapy

## 2012-04-18 ENCOUNTER — Encounter: Payer: Medicare Other | Admitting: Physical Therapy

## 2012-04-30 DIAGNOSIS — Z23 Encounter for immunization: Secondary | ICD-10-CM | POA: Diagnosis not present

## 2012-04-30 DIAGNOSIS — I82409 Acute embolism and thrombosis of unspecified deep veins of unspecified lower extremity: Secondary | ICD-10-CM | POA: Diagnosis not present

## 2012-04-30 DIAGNOSIS — D649 Anemia, unspecified: Secondary | ICD-10-CM | POA: Diagnosis not present

## 2012-04-30 DIAGNOSIS — I10 Essential (primary) hypertension: Secondary | ICD-10-CM | POA: Diagnosis not present

## 2012-04-30 DIAGNOSIS — G500 Trigeminal neuralgia: Secondary | ICD-10-CM | POA: Diagnosis not present

## 2012-04-30 DIAGNOSIS — I2510 Atherosclerotic heart disease of native coronary artery without angina pectoris: Secondary | ICD-10-CM | POA: Diagnosis not present

## 2012-04-30 DIAGNOSIS — R7301 Impaired fasting glucose: Secondary | ICD-10-CM | POA: Diagnosis not present

## 2012-04-30 DIAGNOSIS — E785 Hyperlipidemia, unspecified: Secondary | ICD-10-CM | POA: Diagnosis not present

## 2012-05-03 ENCOUNTER — Encounter: Payer: Self-pay | Admitting: Internal Medicine

## 2012-05-03 DIAGNOSIS — J309 Allergic rhinitis, unspecified: Secondary | ICD-10-CM | POA: Diagnosis not present

## 2012-05-08 ENCOUNTER — Encounter: Payer: Self-pay | Admitting: Internal Medicine

## 2012-05-09 ENCOUNTER — Ambulatory Visit (INDEPENDENT_AMBULATORY_CARE_PROVIDER_SITE_OTHER): Payer: Self-pay | Admitting: General Surgery

## 2012-05-10 ENCOUNTER — Encounter (INDEPENDENT_AMBULATORY_CARE_PROVIDER_SITE_OTHER): Payer: Self-pay | Admitting: General Surgery

## 2012-05-10 ENCOUNTER — Encounter (INDEPENDENT_AMBULATORY_CARE_PROVIDER_SITE_OTHER): Payer: Self-pay

## 2012-05-10 ENCOUNTER — Ambulatory Visit (INDEPENDENT_AMBULATORY_CARE_PROVIDER_SITE_OTHER): Payer: Medicare Other | Admitting: General Surgery

## 2012-05-10 VITALS — BP 134/72 | HR 64 | Temp 97.5°F | Resp 16 | Ht 61.0 in | Wt 157.2 lb

## 2012-05-10 DIAGNOSIS — R51 Headache: Secondary | ICD-10-CM

## 2012-05-10 DIAGNOSIS — J309 Allergic rhinitis, unspecified: Secondary | ICD-10-CM | POA: Diagnosis not present

## 2012-05-10 NOTE — Progress Notes (Signed)
Chief complaint: Headaches  History: Patient is a 77 year old female with multiple medical problems referred by Dr. Clelia Croft for temporal artery biopsy. The patient has a long history of headaches but proximally one month ago had the onset of the much more severe right-sided headache. She was evaluated at that time and treated with gabapentin and also a short course of prednisone. Sed rate was checked and is elevated at 60. She also has had some vision changes although these appear to be chronic. There is concern about possible temporal arteritis and she is referred for a temporal artery biopsy. Of note is she is on Coumadin for history of DVT and possible pulmonary embolus over 6 months ago.  Past Medical History  Diagnosis Date  . Hypertension   . Osteoporosis, senile   . Epilepsy   . Elevated cholesterol   . Seizures   . Cancer of breast, female     43  . History of blood clots    Past Surgical History  Procedure Date  . Cholecystectomy, laparoscopic   . Rotator cuff repair   . Hernia repair   . Cholecystectomy    Current Outpatient Prescriptions  Medication Sig Dispense Refill  . ALPRAZolam (XANAX) 0.5 MG tablet       . amitriptyline (ELAVIL) 10 MG tablet       . lisinopril-hydrochlorothiazide (PRINZIDE,ZESTORETIC) 20-12.5 MG per tablet       . metoprolol tartrate (LOPRESSOR) 25 MG tablet       . phenytoin (DILANTIN) 100 MG ER capsule       . sertraline (ZOLOFT) 100 MG tablet       . warfarin (COUMADIN) 5 MG tablet       . gabapentin (NEURONTIN) 100 MG capsule        Allergies  Allergen Reactions  . Iohexol      Code: RASH, Desc: REMOTE H/O LIP TINGLING, NUMBNESS, AND HIVES WITH IV CM- 13 HR PRE-MED GIVEN AND PATIENT TOLERATED WELL- LY, Onset Date: [id number]   . Sulfonamide Derivatives    Exam: BP 134/72  Pulse 64  Temp 97.5 F (36.4 C) (Temporal)  Resp 16  Ht 5\' 1"  (1.549 m)  Wt 157 lb 3.2 oz (71.305 kg)  BMI 29.70 kg/m2 General: Alert pleasant elderly  female HEENT: There is no tenderness in the right temporal area today. Temporal pulses palpable on the right. No masses. Lymph nodes: No cervical or supraclavicular nodes palpable Lungs: Clear equal breath sounds bilaterally Cardiac: Regular rate and rhythm. No edema.  Assessment and plan: 77 year old female with worsening headaches on the right side with elevated sedimentation rate and some visual changes. She has been referred for temporal artery biopsy. She seemed to have reasonable indications. Her Coumadin will need to be stopped for 5 days and will clear this with Dr. Clelia Croft. We discussed for a biopsy including the indications and risks of bleeding and infection. They understand this is a purely diagnostic procedure. Redone and local anesthesia as an outpatient to

## 2012-05-13 DIAGNOSIS — J309 Allergic rhinitis, unspecified: Secondary | ICD-10-CM | POA: Diagnosis not present

## 2012-05-14 DIAGNOSIS — Z7901 Long term (current) use of anticoagulants: Secondary | ICD-10-CM | POA: Diagnosis not present

## 2012-05-14 DIAGNOSIS — S0990XA Unspecified injury of head, initial encounter: Secondary | ICD-10-CM | POA: Diagnosis not present

## 2012-05-14 DIAGNOSIS — I82409 Acute embolism and thrombosis of unspecified deep veins of unspecified lower extremity: Secondary | ICD-10-CM | POA: Diagnosis not present

## 2012-05-14 DIAGNOSIS — F0781 Postconcussional syndrome: Secondary | ICD-10-CM | POA: Diagnosis not present

## 2012-05-14 DIAGNOSIS — R51 Headache: Secondary | ICD-10-CM | POA: Diagnosis not present

## 2012-05-17 DIAGNOSIS — J309 Allergic rhinitis, unspecified: Secondary | ICD-10-CM | POA: Diagnosis not present

## 2012-05-24 DIAGNOSIS — J309 Allergic rhinitis, unspecified: Secondary | ICD-10-CM | POA: Diagnosis not present

## 2012-05-31 DIAGNOSIS — J309 Allergic rhinitis, unspecified: Secondary | ICD-10-CM | POA: Diagnosis not present

## 2012-06-10 DIAGNOSIS — J309 Allergic rhinitis, unspecified: Secondary | ICD-10-CM | POA: Diagnosis not present

## 2012-06-21 DIAGNOSIS — J309 Allergic rhinitis, unspecified: Secondary | ICD-10-CM | POA: Diagnosis not present

## 2012-06-28 DIAGNOSIS — J309 Allergic rhinitis, unspecified: Secondary | ICD-10-CM | POA: Diagnosis not present

## 2012-07-02 DIAGNOSIS — J309 Allergic rhinitis, unspecified: Secondary | ICD-10-CM | POA: Diagnosis not present

## 2012-07-16 DIAGNOSIS — Z7901 Long term (current) use of anticoagulants: Secondary | ICD-10-CM | POA: Diagnosis not present

## 2012-07-16 DIAGNOSIS — I82409 Acute embolism and thrombosis of unspecified deep veins of unspecified lower extremity: Secondary | ICD-10-CM | POA: Diagnosis not present

## 2012-07-23 DIAGNOSIS — J309 Allergic rhinitis, unspecified: Secondary | ICD-10-CM | POA: Diagnosis not present

## 2012-08-09 DIAGNOSIS — J309 Allergic rhinitis, unspecified: Secondary | ICD-10-CM | POA: Diagnosis not present

## 2012-08-16 DIAGNOSIS — J309 Allergic rhinitis, unspecified: Secondary | ICD-10-CM | POA: Diagnosis not present

## 2012-08-23 DIAGNOSIS — J309 Allergic rhinitis, unspecified: Secondary | ICD-10-CM | POA: Diagnosis not present

## 2012-08-30 DIAGNOSIS — R0789 Other chest pain: Secondary | ICD-10-CM | POA: Diagnosis not present

## 2012-08-30 DIAGNOSIS — J309 Allergic rhinitis, unspecified: Secondary | ICD-10-CM | POA: Diagnosis not present

## 2012-09-02 DIAGNOSIS — E785 Hyperlipidemia, unspecified: Secondary | ICD-10-CM | POA: Diagnosis not present

## 2012-09-02 DIAGNOSIS — R7301 Impaired fasting glucose: Secondary | ICD-10-CM | POA: Diagnosis not present

## 2012-09-02 DIAGNOSIS — I10 Essential (primary) hypertension: Secondary | ICD-10-CM | POA: Diagnosis not present

## 2012-09-02 DIAGNOSIS — I2510 Atherosclerotic heart disease of native coronary artery without angina pectoris: Secondary | ICD-10-CM | POA: Diagnosis not present

## 2012-09-06 DIAGNOSIS — J309 Allergic rhinitis, unspecified: Secondary | ICD-10-CM | POA: Diagnosis not present

## 2012-09-13 DIAGNOSIS — R51 Headache: Secondary | ICD-10-CM | POA: Diagnosis not present

## 2012-09-17 DIAGNOSIS — J309 Allergic rhinitis, unspecified: Secondary | ICD-10-CM | POA: Diagnosis not present

## 2012-09-23 DIAGNOSIS — M316 Other giant cell arteritis: Secondary | ICD-10-CM | POA: Diagnosis not present

## 2012-09-23 DIAGNOSIS — R51 Headache: Secondary | ICD-10-CM | POA: Diagnosis not present

## 2012-09-24 DIAGNOSIS — J309 Allergic rhinitis, unspecified: Secondary | ICD-10-CM | POA: Diagnosis not present

## 2012-09-26 ENCOUNTER — Encounter (HOSPITAL_COMMUNITY): Payer: Self-pay | Admitting: Pharmacy Technician

## 2012-09-27 ENCOUNTER — Encounter (HOSPITAL_COMMUNITY)
Admission: RE | Admit: 2012-09-27 | Discharge: 2012-09-27 | Disposition: A | Discharge status: Home / Self Care | Payer: Medicare Other | Source: Ambulatory Visit | Attending: Otolaryngology | Admitting: Otolaryngology

## 2012-09-27 ENCOUNTER — Other Ambulatory Visit: Payer: Self-pay | Admitting: Otolaryngology

## 2012-09-27 ENCOUNTER — Encounter (HOSPITAL_COMMUNITY)
Admission: RE | Admit: 2012-09-27 | Discharge: 2012-09-27 | Disposition: A | Discharge status: Home / Self Care | Payer: Medicare Other | Source: Ambulatory Visit | Attending: Anesthesiology | Admitting: Anesthesiology

## 2012-09-27 ENCOUNTER — Encounter (HOSPITAL_COMMUNITY): Payer: Self-pay

## 2012-09-27 DIAGNOSIS — E7800 Pure hypercholesterolemia, unspecified: Secondary | ICD-10-CM | POA: Diagnosis not present

## 2012-09-27 DIAGNOSIS — I2510 Atherosclerotic heart disease of native coronary artery without angina pectoris: Secondary | ICD-10-CM | POA: Diagnosis not present

## 2012-09-27 DIAGNOSIS — Z01818 Encounter for other preprocedural examination: Secondary | ICD-10-CM | POA: Diagnosis not present

## 2012-09-27 DIAGNOSIS — M810 Age-related osteoporosis without current pathological fracture: Secondary | ICD-10-CM | POA: Diagnosis not present

## 2012-09-27 DIAGNOSIS — Z8673 Personal history of transient ischemic attack (TIA), and cerebral infarction without residual deficits: Secondary | ICD-10-CM | POA: Diagnosis not present

## 2012-09-27 DIAGNOSIS — M129 Arthropathy, unspecified: Secondary | ICD-10-CM | POA: Diagnosis not present

## 2012-09-27 DIAGNOSIS — G40909 Epilepsy, unspecified, not intractable, without status epilepticus: Secondary | ICD-10-CM | POA: Diagnosis not present

## 2012-09-27 DIAGNOSIS — Z8711 Personal history of peptic ulcer disease: Secondary | ICD-10-CM | POA: Diagnosis not present

## 2012-09-27 DIAGNOSIS — Z86718 Personal history of other venous thrombosis and embolism: Secondary | ICD-10-CM | POA: Diagnosis not present

## 2012-09-27 DIAGNOSIS — E669 Obesity, unspecified: Secondary | ICD-10-CM | POA: Diagnosis not present

## 2012-09-27 DIAGNOSIS — Z7901 Long term (current) use of anticoagulants: Secondary | ICD-10-CM | POA: Diagnosis not present

## 2012-09-27 DIAGNOSIS — F411 Generalized anxiety disorder: Secondary | ICD-10-CM | POA: Diagnosis not present

## 2012-09-27 DIAGNOSIS — R51 Headache: Secondary | ICD-10-CM | POA: Diagnosis not present

## 2012-09-27 DIAGNOSIS — F329 Major depressive disorder, single episode, unspecified: Secondary | ICD-10-CM | POA: Diagnosis not present

## 2012-09-27 DIAGNOSIS — R079 Chest pain, unspecified: Secondary | ICD-10-CM | POA: Diagnosis not present

## 2012-09-27 DIAGNOSIS — Z853 Personal history of malignant neoplasm of breast: Secondary | ICD-10-CM | POA: Diagnosis not present

## 2012-09-27 DIAGNOSIS — Z79899 Other long term (current) drug therapy: Secondary | ICD-10-CM | POA: Diagnosis not present

## 2012-09-27 DIAGNOSIS — I10 Essential (primary) hypertension: Secondary | ICD-10-CM | POA: Diagnosis not present

## 2012-09-27 HISTORY — DX: Anxiety disorder, unspecified: F41.9

## 2012-09-27 HISTORY — DX: Irritable bowel syndrome, unspecified: K58.9

## 2012-09-27 HISTORY — DX: Adverse effect of unspecified anesthetic, initial encounter: T41.45XA

## 2012-09-27 HISTORY — DX: Atherosclerotic heart disease of native coronary artery without angina pectoris: I25.10

## 2012-09-27 HISTORY — DX: Headache: R51

## 2012-09-27 HISTORY — DX: Personal history of other diseases of the digestive system: Z87.19

## 2012-09-27 HISTORY — DX: Unspecified osteoarthritis, unspecified site: M19.90

## 2012-09-27 HISTORY — DX: Major depressive disorder, single episode, unspecified: F32.9

## 2012-09-27 HISTORY — DX: Cerebral infarction, unspecified: I63.9

## 2012-09-27 HISTORY — DX: Urinary tract infection, site not specified: N39.0

## 2012-09-27 HISTORY — DX: Pneumonia, unspecified organism: J18.9

## 2012-09-27 HISTORY — DX: Depression, unspecified: F32.A

## 2012-09-27 HISTORY — DX: Anemia, unspecified: D64.9

## 2012-09-27 HISTORY — DX: Other complications of anesthesia, initial encounter: T88.59XA

## 2012-09-27 HISTORY — DX: Peptic ulcer, site unspecified, unspecified as acute or chronic, without hemorrhage or perforation: K27.9

## 2012-09-27 LAB — BASIC METABOLIC PANEL
BUN: 19 mg/dL (ref 6–23)
Chloride: 104 mEq/L (ref 96–112)
Creatinine, Ser: 1.09 mg/dL (ref 0.50–1.10)
GFR calc Af Amer: 56 mL/min — ABNORMAL LOW (ref >90)
GFR calc non Af Amer: 48 mL/min — ABNORMAL LOW (ref >90)

## 2012-09-27 LAB — CBC
HCT: 33.6 % — ABNORMAL LOW (ref 36.0–46.0)
MCHC: 34.5 g/dL (ref 30.0–36.0)
RDW: 12.9 % (ref 11.5–15.5)

## 2012-09-27 LAB — PROTIME-INR: INR: 2.29 — ABNORMAL HIGH (ref 0.00–1.49)

## 2012-09-27 NOTE — Progress Notes (Addendum)
Contacted Mandy with Dr. Annalee Genta to report abnormal PT/INR and PTT. Also, notified Dr. Gypsy Balsam of patients abnormal lab.  No orders given.

## 2012-09-27 NOTE — Progress Notes (Signed)
09/27/12 1131  OBSTRUCTIVE SLEEP APNEA  Have you ever been diagnosed with sleep apnea through a sleep study? No  Do you snore loudly (loud enough to be heard through closed doors)?  1  Do you often feel tired, fatigued, or sleepy during the daytime? 1  Has anyone observed you stop breathing during your sleep? 1  Do you have, or are you being treated for high blood pressure? 1  BMI more than 35 kg/m2? 0  Age over 76 years old? 1  Neck circumference greater than 40 cm/18 inches? 0 (14 inches)  Gender: 0  Obstructive Sleep Apnea Score 5  Score 4 or greater  Results sent to PCP

## 2012-09-27 NOTE — Progress Notes (Addendum)
Faxed request to Dr. York Spaniel office, requesting last office visit notes, EKG, Stress test, ECho, and cardiac cath. Also called Dr. Samuel Germany office, left message for [facility] requesting last office visit notes/EKG

## 2012-09-27 NOTE — Progress Notes (Signed)
Received call from [facility] with Dr. Annalee Genta, no further orders given regarding abnormal PT/INR,PTT, ok to proceed with surgery.

## 2012-09-27 NOTE — Progress Notes (Signed)
Spoke with Angelica Chessman at Dr. Thurmon Fair office. Dr. Annalee Genta states okay for patient to remain on coumadin.

## 2012-09-27 NOTE — Pre-Procedure Instructions (Signed)
Shamieka Gullo Cheese  09/27/2012   Your procedure is scheduled on:  Tuesday October 01, 2012  Report to [facility] at 5:30 AM.  Call this number if you have problems the morning of surgery: [phone]   Remember:   Do not eat food or drink liquids after midnight.   Take these medicines the morning of surgery with A SIP OF WATER: xanax, lamictal, metoprolol, dilantin, zoloft, coumadin(ok to take per Dr. Annalee Genta)   Do not wear jewelry, make-up or nail polish.  Do not wear lotions, powders, or perfumes.  Do not shave 48 hours prior to surgery.   Do not bring valuables to the hospital.  Contacts, dentures or bridgework may not be worn into surgery.  Leave suitcase in the car. After surgery it may be brought to your room.  For patients admitted to the hospital, checkout time is 11:00 AM the day of  discharge.   Patients discharged the day of surgery will not be allowed to drive  home.  Name and phone number of your driver: family / friend  Special Instructions: Shower using CHG 2 nights before surgery and the night before surgery.  If you shower the day of surgery use CHG.  Use special wash - you have one bottle of CHG for all showers.  You should use approximately 1/3 of the bottle for each shower.   Please read over the following fact sheets that you were given: Pain Booklet, Coughing and Deep Breathing, MRSA Information and Surgical Site Infection Prevention

## 2012-09-30 MED ORDER — CEFAZOLIN SODIUM-DEXTROSE 2-3 GM-% IV SOLR
2.0000 g | INTRAVENOUS | Status: AC
  Administered 2012-10-01: 2 g via INTRAVENOUS
  Filled 2012-09-30 (×2): qty 50

## 2012-09-30 NOTE — Progress Notes (Signed)
Anesthesia Chart Review:  Patient is a 78 year old female scheduled for left temporal artery biopsy on 10/01/12 by Dr. Annalee Genta.  History includes CAD, HTN, HLD, recurrent LLE DVT, left breast cancer, seizures, GERD, IBS, non-smoker. PCP is Dr. Martha Clan.  Cardiologist is Dr. Donnie Aho, last visit was 08/30/12.  His note indicates she has had some atypical chest pain which was felt to be chronic.  EKG then showed NSR, negative T wave in V1. He did not recommend additional testing at that time, but did recommend 3 month follow-up.  Nuclear stress test on 01/24/11 [facility]) showed normal myocardial perfusion, EF 76%, normal LV systolic function, no significant wall motion abnormalities, no significant ischemia.  Cardiac cath on 08/04/10 showed normal LV function, 70% DIAG1 of the LAD and otherwise normal CX and RCA system.  Echo on 08/04/10 showed: Left ventricle: The cavity size was normal. Systolic function was normal. The estimated ejection fraction was in the range of 60% to 65%. Possible hypokinesis of the basal inferolateral myocardium. Doppler parameters are consistent with abnormal left ventricular relaxation (grade 1 diastolic dysfunction). Trivial MR/PR.  CXR on 09/27/12 showed cardiomegaly without acute disease.  Preoperative labs noted.  By notes, these were already called to Dr. Annalee Genta.  He is okay with patient continuing her Coumadin and did not feel any additional orders were warranted.  I reviewed cardiac tests and notes with Anesthesiologist Dr. Sandford Craze.  Patient has had PCP and cardiology follow-up right at one month ago.  No further cardiac testing ordered at that time.  If not acute CV symptoms then patient can likely proceed with this procedure.  Patient will be evaluated by her assigned anesthesiologist on the day of surgery to discuss the definitive anesthesia plan.  Shonna Chock, PA-C 09/30/12 1356

## 2012-10-01 ENCOUNTER — Encounter (HOSPITAL_COMMUNITY): Payer: Self-pay | Admitting: Certified Registered Nurse Anesthetist

## 2012-10-01 ENCOUNTER — Encounter (HOSPITAL_COMMUNITY): Payer: Self-pay | Admitting: Vascular Surgery

## 2012-10-01 ENCOUNTER — Ambulatory Visit (HOSPITAL_COMMUNITY): Payer: Medicare Other | Admitting: Certified Registered Nurse Anesthetist

## 2012-10-01 ENCOUNTER — Ambulatory Visit (HOSPITAL_COMMUNITY)
Admission: RE | Admit: 2012-10-01 | Discharge: 2012-10-01 | Disposition: A | Discharge status: Home / Self Care | Payer: Medicare Other | Source: Ambulatory Visit | Attending: Otolaryngology | Admitting: Otolaryngology

## 2012-10-01 ENCOUNTER — Encounter (HOSPITAL_COMMUNITY)
Admission: RE | Disposition: A | Discharge status: Home / Self Care | Payer: Self-pay | Source: Ambulatory Visit | Attending: Otolaryngology

## 2012-10-01 DIAGNOSIS — Z86718 Personal history of other venous thrombosis and embolism: Secondary | ICD-10-CM | POA: Insufficient documentation

## 2012-10-01 DIAGNOSIS — I10 Essential (primary) hypertension: Secondary | ICD-10-CM | POA: Diagnosis not present

## 2012-10-01 DIAGNOSIS — Z853 Personal history of malignant neoplasm of breast: Secondary | ICD-10-CM | POA: Diagnosis not present

## 2012-10-01 DIAGNOSIS — F411 Generalized anxiety disorder: Secondary | ICD-10-CM | POA: Insufficient documentation

## 2012-10-01 DIAGNOSIS — F329 Major depressive disorder, single episode, unspecified: Secondary | ICD-10-CM | POA: Insufficient documentation

## 2012-10-01 DIAGNOSIS — D210 Benign neoplasm of connective and other soft tissue of head, face and neck: Secondary | ICD-10-CM | POA: Diagnosis not present

## 2012-10-01 DIAGNOSIS — M129 Arthropathy, unspecified: Secondary | ICD-10-CM | POA: Insufficient documentation

## 2012-10-01 DIAGNOSIS — R51 Headache: Secondary | ICD-10-CM | POA: Insufficient documentation

## 2012-10-01 DIAGNOSIS — E7800 Pure hypercholesterolemia, unspecified: Secondary | ICD-10-CM | POA: Diagnosis not present

## 2012-10-01 DIAGNOSIS — Z8673 Personal history of transient ischemic attack (TIA), and cerebral infarction without residual deficits: Secondary | ICD-10-CM | POA: Insufficient documentation

## 2012-10-01 DIAGNOSIS — C50919 Malignant neoplasm of unspecified site of unspecified female breast: Secondary | ICD-10-CM | POA: Diagnosis not present

## 2012-10-01 DIAGNOSIS — R220 Localized swelling, mass and lump, head: Secondary | ICD-10-CM | POA: Diagnosis not present

## 2012-10-01 DIAGNOSIS — M316 Other giant cell arteritis: Secondary | ICD-10-CM | POA: Diagnosis not present

## 2012-10-01 DIAGNOSIS — M810 Age-related osteoporosis without current pathological fracture: Secondary | ICD-10-CM | POA: Diagnosis not present

## 2012-10-01 DIAGNOSIS — Z8711 Personal history of peptic ulcer disease: Secondary | ICD-10-CM | POA: Insufficient documentation

## 2012-10-01 DIAGNOSIS — F3289 Other specified depressive episodes: Secondary | ICD-10-CM | POA: Insufficient documentation

## 2012-10-01 DIAGNOSIS — E669 Obesity, unspecified: Secondary | ICD-10-CM | POA: Insufficient documentation

## 2012-10-01 DIAGNOSIS — I2510 Atherosclerotic heart disease of native coronary artery without angina pectoris: Secondary | ICD-10-CM | POA: Insufficient documentation

## 2012-10-01 DIAGNOSIS — Z79899 Other long term (current) drug therapy: Secondary | ICD-10-CM | POA: Insufficient documentation

## 2012-10-01 DIAGNOSIS — G40909 Epilepsy, unspecified, not intractable, without status epilepticus: Secondary | ICD-10-CM | POA: Insufficient documentation

## 2012-10-01 DIAGNOSIS — Z7901 Long term (current) use of anticoagulants: Secondary | ICD-10-CM | POA: Insufficient documentation

## 2012-10-01 HISTORY — PX: ARTERY BIOPSY: SHX891

## 2012-10-01 SURGERY — BIOPSY TEMPORAL ARTERY
Anesthesia: Monitor Anesthesia Care | Site: Face | Laterality: Left | Wound class: Clean

## 2012-10-01 MED ORDER — OXYCODONE HCL 5 MG PO TABS: 5.0000 mg | ORAL_TABLET | Freq: Once | ORAL | Status: DC | PRN

## 2012-10-01 MED ORDER — CEFAZOLIN SODIUM-DEXTROSE 2-3 GM-% IV SOLR: 2.0000 g | INTRAVENOUS | Status: DC

## 2012-10-01 MED ORDER — LIDOCAINE HCL (CARDIAC) 20 MG/ML IV SOLN
INTRAVENOUS | Status: DC | PRN
  Administered 2012-10-01: 80 mg via INTRAVENOUS

## 2012-10-01 MED ORDER — ONDANSETRON HCL 4 MG/2ML IJ SOLN
INTRAMUSCULAR | Status: DC | PRN
  Administered 2012-10-01: 4 mg via INTRAVENOUS

## 2012-10-01 MED ORDER — LIDOCAINE-EPINEPHRINE 1 %-1:100000 IJ SOLN
INTRAMUSCULAR | Status: AC
  Filled 2012-10-01: qty 1

## 2012-10-01 MED ORDER — PROPOFOL 10 MG/ML IV BOLUS
INTRAVENOUS | Status: DC | PRN
  Administered 2012-10-01: 10 mg via INTRAVENOUS
  Administered 2012-10-01: 20 mg via INTRAVENOUS
  Administered 2012-10-01: 30 mg via INTRAVENOUS
  Administered 2012-10-01 (×2): 10 mg via INTRAVENOUS
  Administered 2012-10-01: 20 mg via INTRAVENOUS
  Administered 2012-10-01: 10 mg via INTRAVENOUS
  Administered 2012-10-01: 20 mg via INTRAVENOUS

## 2012-10-01 MED ORDER — MIDAZOLAM HCL 2 MG/2ML IJ SOLN: 0.5000 mg | Freq: Once | INTRAMUSCULAR | Status: DC | PRN

## 2012-10-01 MED ORDER — 0.9 % SODIUM CHLORIDE (POUR BTL) OPTIME
TOPICAL | Status: DC | PRN
  Administered 2012-10-01: 1000 mL

## 2012-10-01 MED ORDER — PROMETHAZINE HCL 25 MG/ML IJ SOLN: 6.2500 mg | INTRAMUSCULAR | Status: DC | PRN

## 2012-10-01 MED ORDER — MEPERIDINE HCL 25 MG/ML IJ SOLN: 6.2500 mg | INTRAMUSCULAR | Status: DC | PRN

## 2012-10-01 MED ORDER — LIDOCAINE-EPINEPHRINE 1 %-1:100000 IJ SOLN
INTRAMUSCULAR | Status: DC | PRN
  Administered 2012-10-01: 2 mL

## 2012-10-01 MED ORDER — FENTANYL CITRATE 0.05 MG/ML IJ SOLN: 25.0000 ug | INTRAMUSCULAR | Status: DC | PRN

## 2012-10-01 MED ORDER — BACITRACIN ZINC 500 UNIT/GM EX OINT
TOPICAL_OINTMENT | CUTANEOUS | Status: DC | PRN
  Administered 2012-10-01: 1 via TOPICAL

## 2012-10-01 MED ORDER — DEXAMETHASONE SODIUM PHOSPHATE 10 MG/ML IJ SOLN
10.0000 mg | Freq: Once | INTRAMUSCULAR | Status: AC
  Administered 2012-10-01: 10 mg via INTRAVENOUS
  Filled 2012-10-01: qty 1

## 2012-10-01 MED ORDER — LACTATED RINGERS IV SOLN
INTRAVENOUS | Status: DC | PRN
  Administered 2012-10-01: 07:00:00 via INTRAVENOUS

## 2012-10-01 MED ORDER — OXYCODONE HCL 5 MG/5ML PO SOLN: 5.0000 mg | Freq: Once | ORAL | Status: DC | PRN

## 2012-10-01 MED ORDER — FENTANYL CITRATE 0.05 MG/ML IJ SOLN
INTRAMUSCULAR | Status: DC | PRN
  Administered 2012-10-01 (×2): 25 ug via INTRAVENOUS

## 2012-10-01 MED ORDER — BACITRACIN ZINC 500 UNIT/GM EX OINT
TOPICAL_OINTMENT | CUTANEOUS | Status: AC
  Filled 2012-10-01: qty 15

## 2012-10-01 SURGICAL SUPPLY — 48 items
AIRSTRIP 4 3/4X3 1/4 7185 (GAUZE/BANDAGES/DRESSINGS) IMPLANT
ATTRACTOMAT 16X20 MAGNETIC DRP (DRAPES) IMPLANT
BANDAGE CONFORM 2  STR LF (GAUZE/BANDAGES/DRESSINGS) IMPLANT
BANDAGE GAUZE ELAST BULKY 4 IN (GAUZE/BANDAGES/DRESSINGS) IMPLANT
CANISTER SUCTION 2500CC (MISCELLANEOUS) IMPLANT
CATH ROBINSON RED A/P 16FR (CATHETERS) IMPLANT
CLEANER TIP ELECTROSURG 2X2 (MISCELLANEOUS) IMPLANT
CLOTH BEACON ORANGE TIMEOUT ST (SAFETY) ×2 IMPLANT
CONT SPEC 4OZ CLIKSEAL STRL BL (MISCELLANEOUS) ×2 IMPLANT
COVER PROBE W GEL 5X96 (DRAPES) ×2 IMPLANT
COVER SURGICAL LIGHT HANDLE (MISCELLANEOUS) ×2 IMPLANT
DRAIN PENROSE 1/4X12 LTX STRL (WOUND CARE) IMPLANT
DRSG EMULSION OIL 3X3 NADH (GAUZE/BANDAGES/DRESSINGS) IMPLANT
ELECT COATED BLADE 2.86 ST (ELECTRODE) ×2 IMPLANT
ELECT NEEDLE BLADE 2-5/6 (NEEDLE) ×2 IMPLANT
ELECT NEEDLE TIP 2.8 STRL (NEEDLE) IMPLANT
ELECT REM PT RETURN 9FT ADLT (ELECTROSURGICAL) ×2
ELECTRODE REM PT RTRN 9FT ADLT (ELECTROSURGICAL) ×1 IMPLANT
GAUZE SPONGE 4X4 16PLY XRAY LF (GAUZE/BANDAGES/DRESSINGS) IMPLANT
GLOVE BIO SURGEON STRL SZ 6.5 (GLOVE) ×2 IMPLANT
GLOVE ECLIPSE 7.5 STRL STRAW (GLOVE) ×2 IMPLANT
GOWN STRL NON-REIN LRG LVL3 (GOWN DISPOSABLE) ×4 IMPLANT
KIT BASIN OR (CUSTOM PROCEDURE TRAY) ×2 IMPLANT
KIT ROOM TURNOVER OR (KITS) ×2 IMPLANT
NEEDLE 25GX 5/8IN NON SAFETY (NEEDLE) ×2 IMPLANT
NS IRRIG 1000ML POUR BTL (IV SOLUTION) ×2 IMPLANT
PAD ARMBOARD 7.5X6 YLW CONV (MISCELLANEOUS) ×4 IMPLANT
PENCIL FOOT CONTROL (ELECTRODE) IMPLANT
POUCH STERILIZING 3 X22 (STERILIZATION PRODUCTS) IMPLANT
SPONGE GAUZE 4X4 12PLY (GAUZE/BANDAGES/DRESSINGS) IMPLANT
STAPLER VISISTAT (STAPLE) ×2 IMPLANT
SUT CHROMIC 4 0 P 3 18 (SUTURE) IMPLANT
SUT ETHILON 4 0 PS 2 18 (SUTURE) IMPLANT
SUT ETHILON 5 0 P 3 18 (SUTURE)
SUT NYLON ETHILON 5-0 P-3 1X18 (SUTURE) IMPLANT
SUT SILK 4 0 REEL (SUTURE) IMPLANT
SUT VIC AB 4-0 BRD 54 (SUTURE) ×2 IMPLANT
SUT VIC AB 5-0 P-3 18XBRD (SUTURE) ×1 IMPLANT
SUT VIC AB 5-0 P3 18 (SUTURE) ×1
SWAB COLLECTION DEVICE MRSA (MISCELLANEOUS) IMPLANT
SYR BULB IRRIGATION 50ML (SYRINGE) IMPLANT
SYR TB 1ML LUER SLIP (SYRINGE) IMPLANT
TOWEL OR 17X24 6PK STRL BLUE (TOWEL DISPOSABLE) ×2 IMPLANT
TOWEL OR 17X26 10 PK STRL BLUE (TOWEL DISPOSABLE) ×2 IMPLANT
TRAY ENT MC OR (CUSTOM PROCEDURE TRAY) ×2 IMPLANT
TUBE ANAEROBIC SPECIMEN COL (MISCELLANEOUS) IMPLANT
WATER STERILE IRR 1000ML POUR (IV SOLUTION) IMPLANT
YANKAUER SUCT BULB TIP NO VENT (SUCTIONS) IMPLANT

## 2012-10-01 NOTE — Transfer of Care (Signed)
Immediate Anesthesia Transfer of Care Note  Patient: [name]  Procedure(s) Performed: Procedure(s): LEFT TEMPORAL ARTERY BIOPSY WITH ULTRASOUND PROBE (Left)  Patient Location: Short Stay  Anesthesia Type:MAC  Level of Consciousness: awake  Airway & Oxygen Therapy: Patient Spontanous Breathing  Post-op Assessment: Report given to PACU RN and Post -op Vital signs reviewed and stable  Post vital signs: Reviewed and stable  Complications: No apparent anesthesia complications

## 2012-10-01 NOTE — H&P (Signed)
[name] is an 78 y.o. female.   Chief Complaint: Temporal headache HPI: HX of recurrent temp h/a, tenderness and swelling.  Past Medical History  Diagnosis Date  . Osteoporosis, senile   . Elevated cholesterol   . Cancer of breast, female     72  . History of blood clots     sees Dr. Donnie [name]  . Complication of anesthesia     "strong pain meds etc, causes dizziness"  . Hypertension     sees Dr. Martha [name]  . Depression   . Anxiety   . Pneumonia     "bilaterally pneumonia, hospitalized 15 years ago"  . Stroke     hx of blood clots  . Urinary tract infection     hx of  . H/O hiatal hernia     "had surgery past hx"  . Headache     "occas"  . Arthritis   . Anemia     hx of  . Irritable bowel syndrome     hx of  . Peptic ulcer disease     hx of  . Coronary artery disease     sees Dr. Donnie [name]  . Epilepsy     sees Dr. Sandria [name]  . Seizures     Past Surgical History  Procedure Laterality Date  . Cholecystectomy, laparoscopic    . Rotator cuff repair    . Hernia repair    . Cholecystectomy    . Eye surgery      bilateral cataract surgery  . Surgery to uterus      "patient unsure of term, states not fibroids"  . Breast surgery      bilateral lumpectomies  . Dilation and curettage of uterus    . Cardiac catheterization      Family History  Problem Relation Age of Onset  . Heart disease Father   . Cancer Sister     leukemia   Social History:  reports that she has never smoked. She does not have any smokeless tobacco history on file. She reports that she does not drink alcohol or use illicit drugs.  Allergies:  Allergies  Allergen Reactions  . Iohexol      Code: RASH, Desc: REMOTE H/O LIP TINGLING, NUMBNESS, AND HIVES WITH IV CM- 13 HR PRE-MED GIVEN AND PATIENT TOLERATED WELL- LY, Onset Date: 16109604   . Sulfonamide Derivatives Swelling    Medications Prior to Admission  Medication Sig Dispense Refill  . acetaminophen (TYLENOL) 500 MG tablet Take  500 mg by mouth every 6 (six) hours as needed for pain.      Marland Kitchen ALPRAZolam (XANAX) 0.5 MG tablet Take 0.5 mg by mouth 2 (two) times daily as needed for anxiety.       . Calcium Citrate-Vitamin D (CITRACAL + D PO) Take 2 tablets by mouth daily.      Marland Kitchen lamoTRIgine (LAMICTAL) 100 MG tablet Take 50 mg by mouth 2 (two) times daily.      Marland Kitchen lisinopril-hydrochlorothiazide (PRINZIDE,ZESTORETIC) 20-12.5 MG per tablet Take 1 tablet by mouth daily.       . metoprolol tartrate (LOPRESSOR) 25 MG tablet Take 25 mg by mouth 2 (two) times daily.       . Multiple Vitamin (MULTIVITAMIN WITH MINERALS) TABS Take 1 tablet by mouth daily.      . phenytoin (DILANTIN) 100 MG ER capsule Take 100 mg by mouth 2 (two) times daily.       . rosuvastatin (CRESTOR) 20 MG tablet Take 10  mg by mouth daily.      . sertraline (ZOLOFT) 100 MG tablet Take 100 mg by mouth daily.       Marland Kitchen warfarin (COUMADIN) 5 MG tablet Take 5-7.5 mg by mouth daily. Take one tablet (5 mg) on Mondays, Tuesdays, Wednesday, Fridays and Sauturdays. Take one and one-half tablets (7.5 mg) on Thursdays and Sundays.        No results found for this or any previous visit (from the past 48 hour(s)). No results found.  Review of Systems  Constitutional: Negative.   Respiratory: Negative.   Cardiovascular: Negative.   Gastrointestinal: Positive for heartburn.  Musculoskeletal: Positive for myalgias.  Skin: Negative.   Neurological: Negative.     Blood pressure 108/58, pulse 50, temperature 97.2 F (36.2 C), temperature source Oral, resp. rate 18, SpO2 95.00%. Physical Exam  Constitutional: She is oriented to person, place, and time. She appears well-developed and well-nourished.  HENT:  Palpable temp pulses, min tender  Neck: Normal range of motion. Neck supple.  Cardiovascular: Normal rate.   Respiratory: Effort normal and breath sounds normal.  GI: Soft.  Musculoskeletal: Normal range of motion.  Neurological: She is alert and oriented to person,  place, and time.     Assessment/Plan Adm for OP bx of Lt temp artery  [name] 10/01/2012, 7:27 AM

## 2012-10-01 NOTE — Progress Notes (Signed)
Beta blocker held due to pulse rate of 60

## 2012-10-01 NOTE — Brief Op Note (Signed)
10/01/2012  8:43 AM  PATIENT:  [name]  78 y.o. female  PRE-OPERATIVE DIAGNOSIS:  temporal arteritis  POST-OPERATIVE DIAGNOSIS:  temporal arteritis  PROCEDURE:  Procedure(s): LEFT TEMPORAL ARTERY BIOPSY WITH ULTRASOUND PROBE (Left)  SURGEON:  Surgeon(s) and Role:    * Osborn Coho, MD - Primary  PHYSICIAN ASSISTANT:   ASSISTANTS: none   ANESTHESIA:   local and IV sedation  EBL:  Total I/O In: 700 [I.V.:700] Out: - Min  BLOOD ADMINISTERED:none  DRAINS: none   LOCAL MEDICATIONS USED:  LIDOCAINE  and Amount: 2 ml  SPECIMEN:  Source of Specimen:  Lt temporal artery  DISPOSITION OF SPECIMEN:  PATHOLOGY  COUNTS:  YES  TOURNIQUET:  * No tourniquets in log *  DICTATION: .Other Dictation: Dictation Number [id number]  PLAN OF CARE: Discharge to home after PACU  PATIENT DISPOSITION:  PACU - hemodynamically stable.   Delay start of Pharmacological VTE agent (>24hrs) due to surgical blood loss or risk of bleeding: not applicable

## 2012-10-01 NOTE — Preoperative (Signed)
Beta Blockers   Reason not to administer Beta Blockers:Not Applicable 

## 2012-10-01 NOTE — Op Note (Signed)
[name], [name]              ACCOUNT NO.:  [id number]  MEDICAL RECORD NO.:  [id number]  LOCATION:  [facility]                         FACILITY:  [facility]  PHYSICIAN:  Kinnie Scales. Annalee Genta, M.D.DATE OF BIRTH:  13-Aug-1934  DATE OF PROCEDURE:  10/01/2012 DATE OF DISCHARGE:  10/01/2012                              OPERATIVE REPORT   PREOPERATIVE DIAGNOSES: 1. Chronic intermittent temporal headache. 2. Possible temporal arteritis.  POSTOPERATIVE DIAGNOSES: 1. Chronic intermittent temporal headache. 2. Possible temporal arteritis.  INDICATIONS FOR PROCEDURE: 1. Chronic intermittent temporal headache. 2. Possible temporal arteritis.  PROCEDURE:  Biopsy of left temporal artery.  ANESTHESIA:  Local with MAC.  COMPLICATIONS:  No complications.  BLOOD LOSS:  Minimal.  DISPOSITION:  The patient was transferred from the operating room to the recovery room in stable condition.  BRIEF HISTORY:  The patient is a 78 year old white female followed by her neurologist, Dr. Sandria Manly with a history of recurrent, severe, temporal headaches, and possible claudication of the jaw.  Given her history and findings with elevated sed rate and multiple other medical issues, she was referred to our office for evaluation and biopsy of the temporal artery to rule out temporal arteritis.  The patient had a complex medical history, which included active DVTs and was on high-dose anticoagulant therapy, unable to discontinue.  Given that history, the patient's surgery was performed at [facility] OR under monitored anesthesia care and local anesthetic with careful hemostasis. The risks and benefits of the surgical procedure were discussed in detail with the patient who understood and concurred with our plan for surgery, which is scheduled on an elective basis at [facility].  PROCEDURE:  The patient was brought to the operating room on October 01, 2012, and placed in a supine position on the  operating table, sedated anesthesia was established.  The patient was then injected with 2 mL of 1% lidocaine with 1:100,000 solution epinephrine which was injected in a subcutaneous fashion along the left preauricular sulcus and extending superiorly.  She was then positioned and prepped and draped.  A 2-cm vertically-oriented incision was created through the skin, beveled to angle with the patient's hair follicles.  This was carried through the skin and underlying subcutaneous tissue.  Temporalis fascia was identified and divided and immediately deep to the superficial temporal fascia, a neurovascular bundle was identified.  Prior to surgery and throughout the case, the ultrasound probe was utilized to try to identify pulsations within the temporal artery.  Unfortunately given the patient's underlying medical diseases, we were unable to establish adequate pulse in the temporal artery at the time of the surgical procedure.  A representative segment of vessel was then carefully dissected, divided, and suture ligated and a total of approximately 3 cm of vessel were removed and sent to Pathology for gross microscopic evaluation.  Hemostasis was maintained with Bovie electrocautery and suture ligation.  The patient's incision was then closed carefully in layers with reapproximation of the deep subcutaneous tissue with interrupted 5-0 Vicryl suture and final skin edge closure with stainless steel surgical staples.  The wound was dressed with bacitracin ointment.  The patient was then awakened and transferred from the operating room to  recovery in stable condition.  There were no complications and blood loss was minimal.          ______________________________ Kinnie Scales. Annalee Genta, M.D.     DLS/MEDQ  D:  78/29/5621  T:  10/01/2012  Job:  [id number]  cc:   Genene Churn. Love, M.D.

## 2012-10-01 NOTE — Anesthesia Preprocedure Evaluation (Signed)
Anesthesia Evaluation  Patient identified by MRN, date of birth, ID band Patient awake    Reviewed: Allergy & Precautions, H&P , NPO status , Patient's Chart, lab work & pertinent test results, reviewed documented beta blocker date and time   History of Anesthesia Complications Negative for: history of anesthetic complications  Airway Mallampati: I TM Distance: >3 FB     Dental  (+) Edentulous Upper and Partial Lower   Pulmonary neg pulmonary ROS, neg pneumonia -,  breath sounds clear to auscultation  Pulmonary exam normal       Cardiovascular hypertension, Pt. on medications and Pt. on home beta blockers + CAD (cath '11: sincle vessel 70% lesion D1 ) and DVT (coumadin, INR 2.29) Rhythm:Regular Rate:Normal  '12 stress test: normal perfusion without ischemia, EF 76%   Neuro/Psych Seizures - (last seizure 12 years ago), Well Controlled,  Anxiety Depression TIA   GI/Hepatic Neg liver ROS, GERD-  Controlled,  Endo/Other  negative endocrine ROS  Renal/GU negative Renal ROS     Musculoskeletal   Abdominal (+) + obese,   Peds  Hematology   Anesthesia Other Findings   Reproductive/Obstetrics                           Anesthesia Physical Anesthesia Plan  ASA: III  Anesthesia Plan: MAC   Post-op Pain Management:    Induction: Intravenous  Airway Management Planned: Simple Face Mask  Additional Equipment:   Intra-op Plan:   Post-operative Plan:   Informed Consent: I have reviewed the patients History and Physical, chart, labs and discussed the procedure including the risks, benefits and alternatives for the proposed anesthesia with the patient or authorized representative who has indicated his/her understanding and acceptance.   Dental advisory given  Plan Discussed with: Surgeon and CRNA  Anesthesia Plan Comments: (Plan routine monitors, MAC)        Anesthesia Quick Evaluation

## 2012-10-01 NOTE — Anesthesia Postprocedure Evaluation (Signed)
  Anesthesia Post-op Note  Patient: [name]  Procedure(s) Performed: Procedure(s): LEFT TEMPORAL ARTERY BIOPSY WITH ULTRASOUND PROBE (Left)  Patient Location: PACU  Anesthesia Type:MAC  Level of Consciousness: awake, alert , oriented and patient cooperative  Airway and Oxygen Therapy: Patient Spontanous Breathing  Post-op Pain: none  Post-op Assessment: Post-op Vital signs reviewed, Patient's Cardiovascular Status Stable, Respiratory Function Stable, Patent Airway, No signs of Nausea or vomiting and Pain level controlled  Post-op Vital Signs: Reviewed and stable  Complications: No apparent anesthesia complications

## 2012-10-03 ENCOUNTER — Encounter (HOSPITAL_COMMUNITY): Payer: Self-pay | Admitting: Otolaryngology

## 2012-10-09 DIAGNOSIS — J309 Allergic rhinitis, unspecified: Secondary | ICD-10-CM | POA: Diagnosis not present

## 2012-10-17 DIAGNOSIS — J309 Allergic rhinitis, unspecified: Secondary | ICD-10-CM | POA: Diagnosis not present

## 2012-11-08 DIAGNOSIS — J309 Allergic rhinitis, unspecified: Secondary | ICD-10-CM | POA: Diagnosis not present

## 2012-11-12 DIAGNOSIS — I82409 Acute embolism and thrombosis of unspecified deep veins of unspecified lower extremity: Secondary | ICD-10-CM | POA: Diagnosis not present

## 2012-11-12 DIAGNOSIS — Z7901 Long term (current) use of anticoagulants: Secondary | ICD-10-CM | POA: Diagnosis not present

## 2012-11-15 DIAGNOSIS — J309 Allergic rhinitis, unspecified: Secondary | ICD-10-CM | POA: Diagnosis not present

## 2012-11-26 DIAGNOSIS — I82409 Acute embolism and thrombosis of unspecified deep veins of unspecified lower extremity: Secondary | ICD-10-CM | POA: Diagnosis not present

## 2012-11-26 DIAGNOSIS — Z7901 Long term (current) use of anticoagulants: Secondary | ICD-10-CM | POA: Diagnosis not present

## 2012-11-29 DIAGNOSIS — J309 Allergic rhinitis, unspecified: Secondary | ICD-10-CM | POA: Diagnosis not present

## 2012-12-02 DIAGNOSIS — G40401 Other generalized epilepsy and epileptic syndromes, not intractable, with status epilepticus: Secondary | ICD-10-CM | POA: Diagnosis not present

## 2012-12-02 DIAGNOSIS — Z853 Personal history of malignant neoplasm of breast: Secondary | ICD-10-CM | POA: Diagnosis not present

## 2012-12-02 DIAGNOSIS — E669 Obesity, unspecified: Secondary | ICD-10-CM | POA: Diagnosis not present

## 2012-12-02 DIAGNOSIS — I10 Essential (primary) hypertension: Secondary | ICD-10-CM | POA: Diagnosis not present

## 2012-12-02 DIAGNOSIS — E785 Hyperlipidemia, unspecified: Secondary | ICD-10-CM | POA: Diagnosis not present

## 2012-12-02 DIAGNOSIS — Z7901 Long term (current) use of anticoagulants: Secondary | ICD-10-CM | POA: Diagnosis not present

## 2012-12-02 DIAGNOSIS — I2510 Atherosclerotic heart disease of native coronary artery without angina pectoris: Secondary | ICD-10-CM | POA: Diagnosis not present

## 2012-12-02 DIAGNOSIS — Z86718 Personal history of other venous thrombosis and embolism: Secondary | ICD-10-CM | POA: Diagnosis not present

## 2012-12-03 DIAGNOSIS — J309 Allergic rhinitis, unspecified: Secondary | ICD-10-CM | POA: Diagnosis not present

## 2012-12-07 IMAGING — CR DG CERVICAL SPINE COMPLETE 4+V
6 series · 6 of 6 positions shown · non-contrast
Comparison: CT of the cervical spine of 12/08/2008

CLINICAL DATA: Fell 1 month ago hitting head, with posterior neck
and skull pain

CERVICAL SPINE - COMPLETE 4+ VIEW

[w c-spine lat]
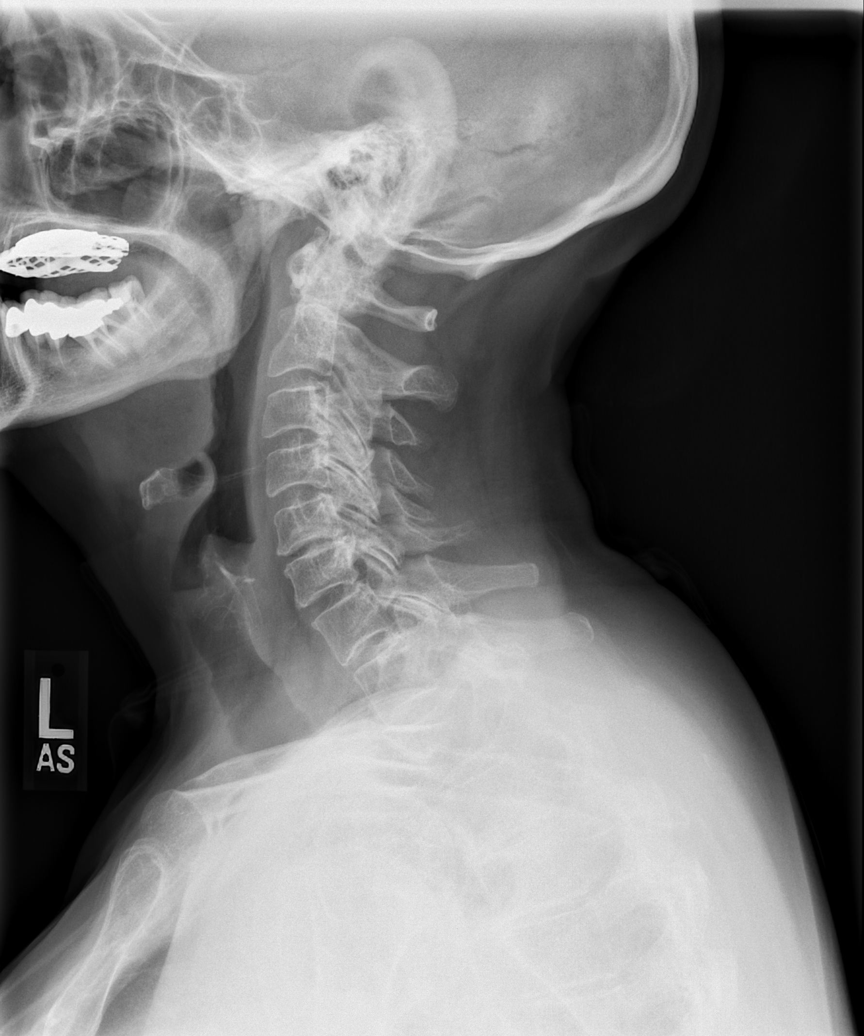

[w c-spine oblique (1 of 2)]
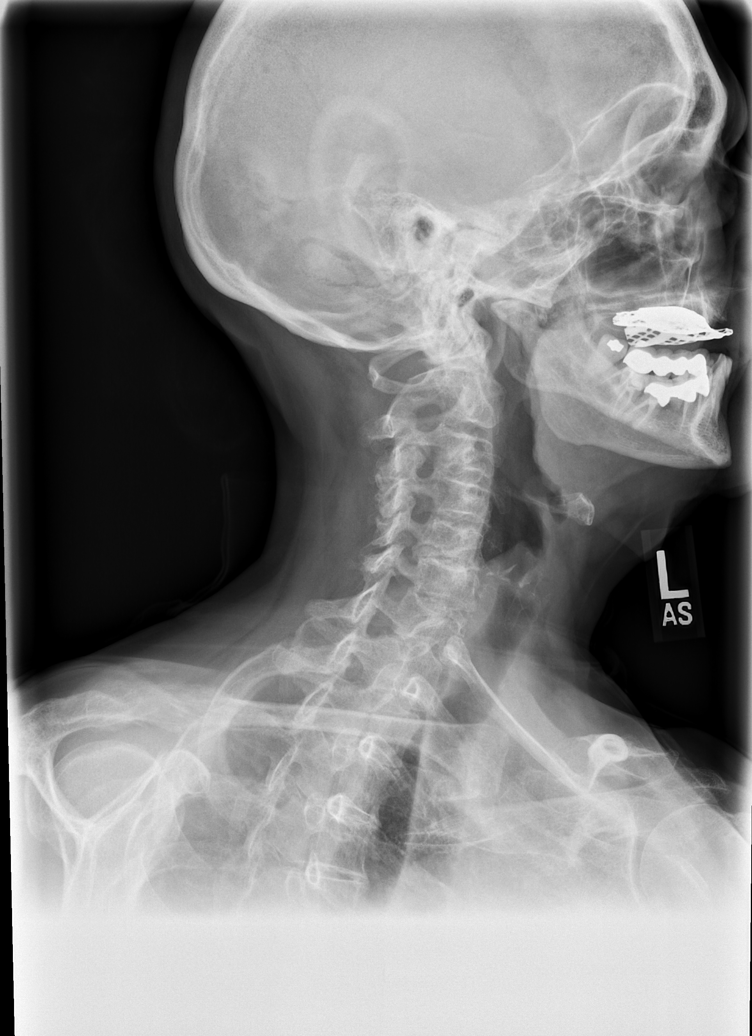

[w c-spine oblique (2 of 2)]
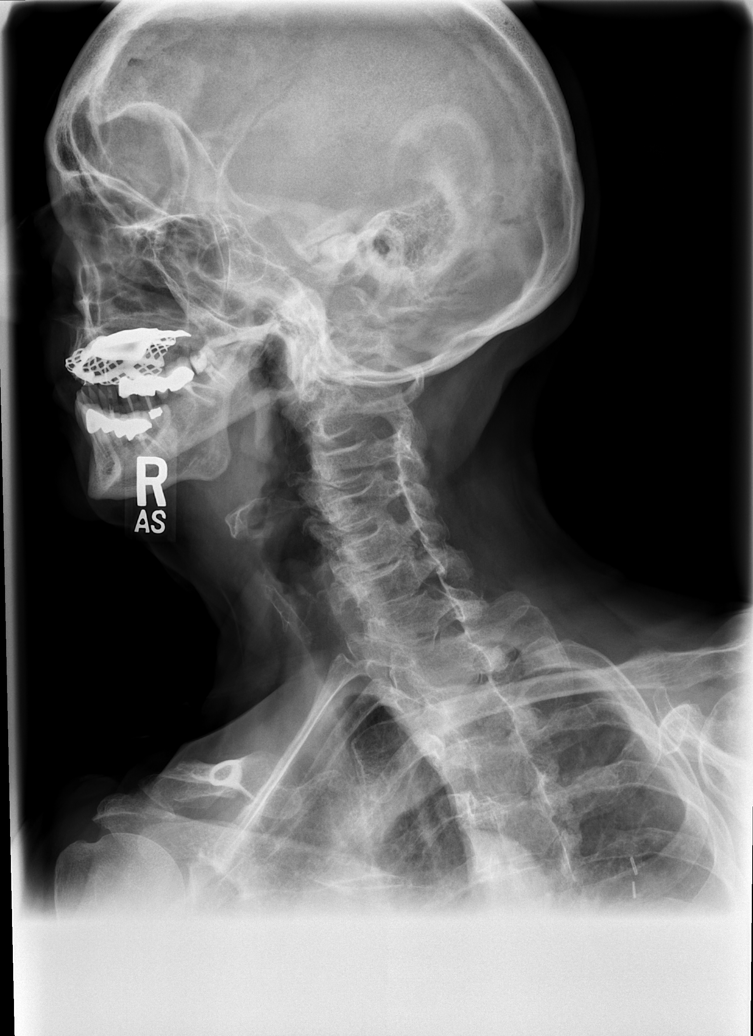

[w c-spine a.p. *]
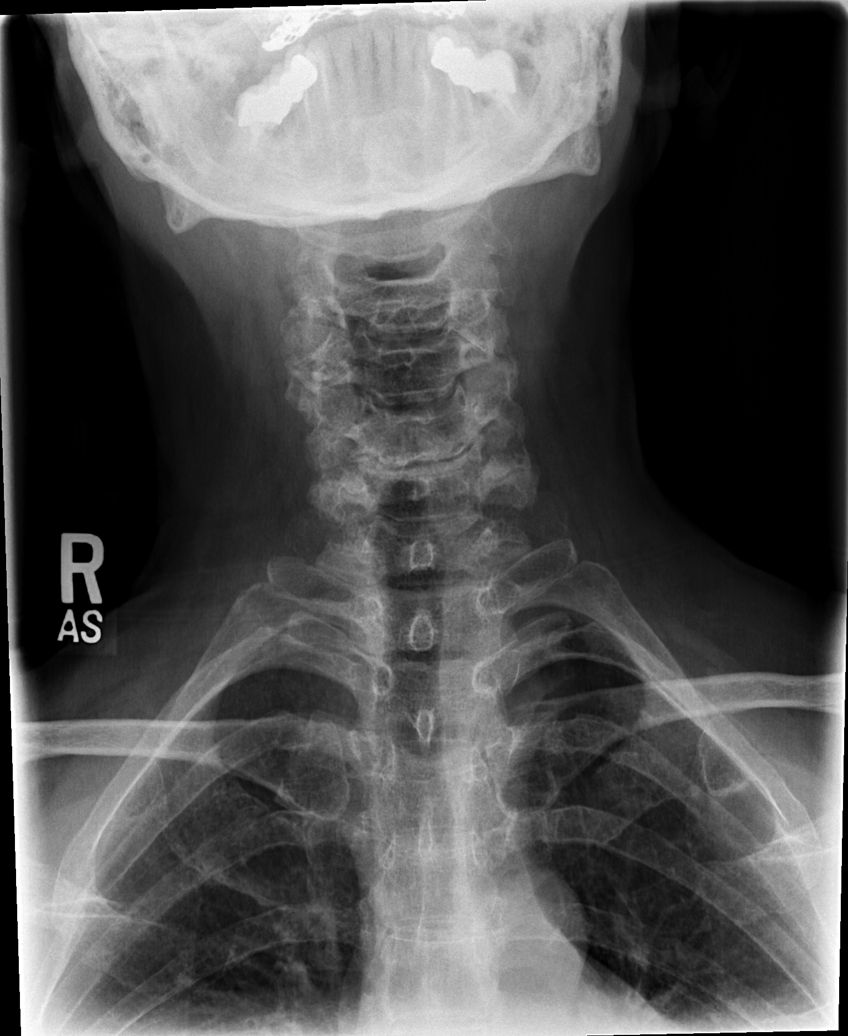

[w c-spine odontoid * (1 of 2)]
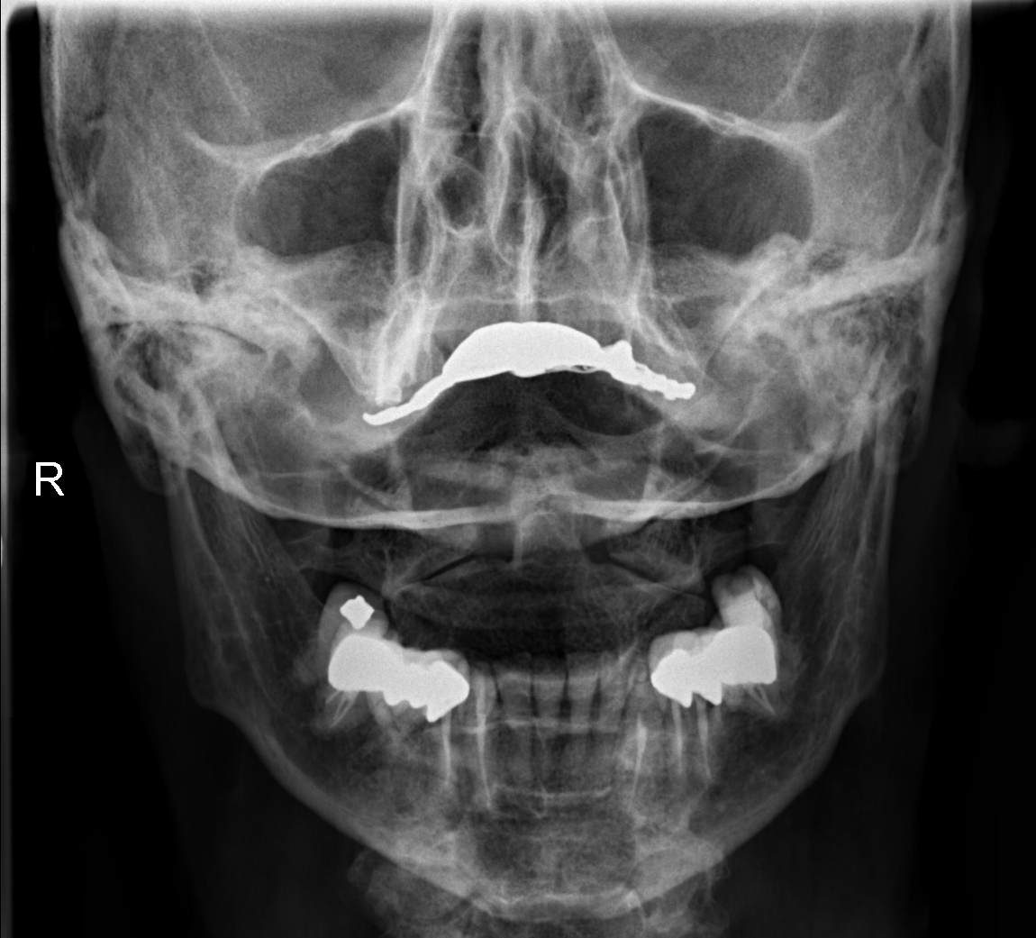

[w c-spine odontoid * (2 of 2)]
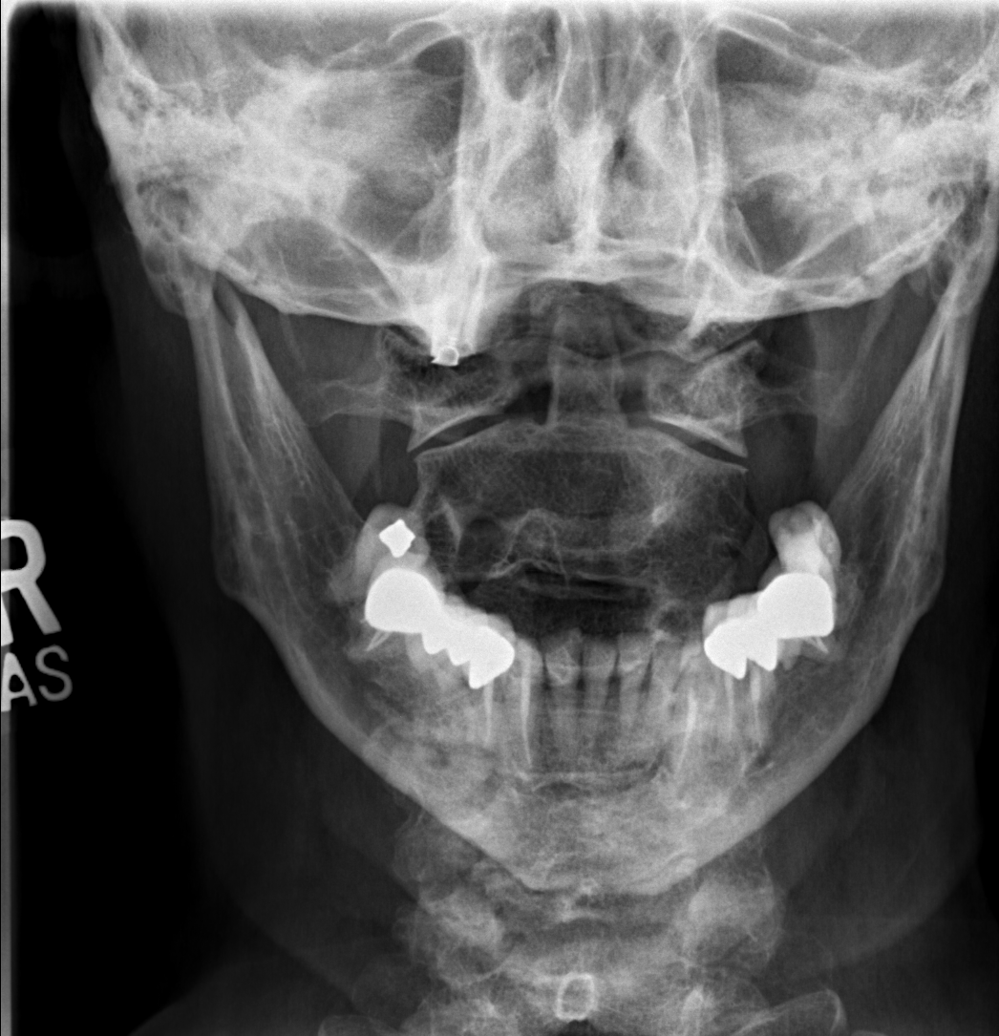

[6 of 6 positions shown; findings below may reference images not displayed]

FINDINGS: The cervical vertebrae remain in normal alignment.  There
is degenerative disc disease particularly at C5-6 where there is
loss of disc space, sclerosis, and spurring.  No prevertebral soft
tissue swelling is seen.  On oblique views there is foraminal
narrowing of a moderate degree at C5-6 bilaterally.  The remainder
of the foramina are patent.  The odontoid process is intact.  The
lung apices are clear.
IMPRESSION: Degenerative disc disease at C5-6 with moderate foraminal narrowing
bilaterally at that level.  Normal alignment.

## 2012-12-09 DIAGNOSIS — J309 Allergic rhinitis, unspecified: Secondary | ICD-10-CM | POA: Diagnosis not present

## 2012-12-10 ENCOUNTER — Encounter: Payer: Self-pay | Admitting: Physician Assistant

## 2012-12-17 ENCOUNTER — Ambulatory Visit: Payer: Medicare Other | Admitting: Physician Assistant

## 2012-12-17 ENCOUNTER — Encounter: Payer: Self-pay | Admitting: *Deleted

## 2012-12-19 DIAGNOSIS — J309 Allergic rhinitis, unspecified: Secondary | ICD-10-CM | POA: Diagnosis not present

## 2012-12-26 DIAGNOSIS — I82409 Acute embolism and thrombosis of unspecified deep veins of unspecified lower extremity: Secondary | ICD-10-CM | POA: Diagnosis not present

## 2012-12-26 DIAGNOSIS — J309 Allergic rhinitis, unspecified: Secondary | ICD-10-CM | POA: Diagnosis not present

## 2012-12-26 DIAGNOSIS — Z7901 Long term (current) use of anticoagulants: Secondary | ICD-10-CM | POA: Diagnosis not present

## 2013-01-03 DIAGNOSIS — J309 Allergic rhinitis, unspecified: Secondary | ICD-10-CM | POA: Diagnosis not present

## 2013-01-10 DIAGNOSIS — J309 Allergic rhinitis, unspecified: Secondary | ICD-10-CM | POA: Diagnosis not present

## 2013-01-17 DIAGNOSIS — J309 Allergic rhinitis, unspecified: Secondary | ICD-10-CM | POA: Diagnosis not present

## 2013-01-24 DIAGNOSIS — J309 Allergic rhinitis, unspecified: Secondary | ICD-10-CM | POA: Diagnosis not present

## 2013-01-27 DIAGNOSIS — I10 Essential (primary) hypertension: Secondary | ICD-10-CM | POA: Diagnosis not present

## 2013-01-27 DIAGNOSIS — E785 Hyperlipidemia, unspecified: Secondary | ICD-10-CM | POA: Diagnosis not present

## 2013-01-27 DIAGNOSIS — R82998 Other abnormal findings in urine: Secondary | ICD-10-CM | POA: Diagnosis not present

## 2013-01-27 DIAGNOSIS — I2510 Atherosclerotic heart disease of native coronary artery without angina pectoris: Secondary | ICD-10-CM | POA: Diagnosis not present

## 2013-01-27 DIAGNOSIS — R569 Unspecified convulsions: Secondary | ICD-10-CM | POA: Diagnosis not present

## 2013-01-27 DIAGNOSIS — R7301 Impaired fasting glucose: Secondary | ICD-10-CM | POA: Diagnosis not present

## 2013-01-30 DIAGNOSIS — I82409 Acute embolism and thrombosis of unspecified deep veins of unspecified lower extremity: Secondary | ICD-10-CM | POA: Diagnosis not present

## 2013-01-30 DIAGNOSIS — I10 Essential (primary) hypertension: Secondary | ICD-10-CM | POA: Diagnosis not present

## 2013-01-30 DIAGNOSIS — E785 Hyperlipidemia, unspecified: Secondary | ICD-10-CM | POA: Diagnosis not present

## 2013-01-30 DIAGNOSIS — Z79899 Other long term (current) drug therapy: Secondary | ICD-10-CM | POA: Diagnosis not present

## 2013-01-30 DIAGNOSIS — Z7901 Long term (current) use of anticoagulants: Secondary | ICD-10-CM | POA: Diagnosis not present

## 2013-01-30 DIAGNOSIS — I2510 Atherosclerotic heart disease of native coronary artery without angina pectoris: Secondary | ICD-10-CM | POA: Diagnosis not present

## 2013-01-30 DIAGNOSIS — Z0000 Encounter for general adult medical examination without abnormal findings: Secondary | ICD-10-CM | POA: Diagnosis not present

## 2013-01-30 DIAGNOSIS — R7301 Impaired fasting glucose: Secondary | ICD-10-CM | POA: Diagnosis not present

## 2013-01-30 DIAGNOSIS — Z1331 Encounter for screening for depression: Secondary | ICD-10-CM | POA: Diagnosis not present

## 2013-01-30 DIAGNOSIS — D649 Anemia, unspecified: Secondary | ICD-10-CM | POA: Diagnosis not present

## 2013-01-31 DIAGNOSIS — J309 Allergic rhinitis, unspecified: Secondary | ICD-10-CM | POA: Diagnosis not present

## 2013-02-04 ENCOUNTER — Encounter: Payer: Medicare Other | Admitting: Internal Medicine

## 2013-02-12 DIAGNOSIS — M779 Enthesopathy, unspecified: Secondary | ICD-10-CM | POA: Diagnosis not present

## 2013-02-14 DIAGNOSIS — J309 Allergic rhinitis, unspecified: Secondary | ICD-10-CM | POA: Diagnosis not present

## 2013-02-18 ENCOUNTER — Other Ambulatory Visit: Payer: Self-pay

## 2013-02-18 MED ORDER — PHENYTOIN SODIUM EXTENDED 100 MG PO CAPS: 100.0000 mg | ORAL_CAPSULE | Freq: Two times a day (BID) | ORAL | Status: DC

## 2013-02-18 NOTE — Telephone Encounter (Signed)
Former Love patient assigned to Dr Yan  

## 2013-02-21 DIAGNOSIS — J309 Allergic rhinitis, unspecified: Secondary | ICD-10-CM | POA: Diagnosis not present

## 2013-02-28 DIAGNOSIS — J309 Allergic rhinitis, unspecified: Secondary | ICD-10-CM | POA: Diagnosis not present

## 2013-03-07 DIAGNOSIS — J309 Allergic rhinitis, unspecified: Secondary | ICD-10-CM | POA: Diagnosis not present

## 2013-03-13 ENCOUNTER — Ambulatory Visit: Payer: Self-pay | Admitting: Neurology

## 2013-03-14 DIAGNOSIS — J309 Allergic rhinitis, unspecified: Secondary | ICD-10-CM | POA: Diagnosis not present

## 2013-03-19 ENCOUNTER — Telehealth: Payer: Self-pay | Admitting: Neurology

## 2013-03-20 DIAGNOSIS — I82409 Acute embolism and thrombosis of unspecified deep veins of unspecified lower extremity: Secondary | ICD-10-CM | POA: Diagnosis not present

## 2013-03-20 DIAGNOSIS — Z7901 Long term (current) use of anticoagulants: Secondary | ICD-10-CM | POA: Diagnosis not present

## 2013-03-21 DIAGNOSIS — J309 Allergic rhinitis, unspecified: Secondary | ICD-10-CM | POA: Diagnosis not present

## 2013-03-21 NOTE — Telephone Encounter (Signed)
Former Dr. Sandria Manly patient requesting appt. Showing appt scheduled w/ Dr. Terrace Arabia on 03/25/13. Closing encounter.

## 2013-03-22 ENCOUNTER — Encounter: Payer: Self-pay | Admitting: Neurology

## 2013-03-25 ENCOUNTER — Encounter: Payer: Self-pay | Admitting: Neurology

## 2013-03-25 ENCOUNTER — Ambulatory Visit (INDEPENDENT_AMBULATORY_CARE_PROVIDER_SITE_OTHER): Payer: Medicare Other | Admitting: Neurology

## 2013-03-25 VITALS — BP 125/66 | HR 68 | Ht 61.0 in | Wt 154.0 lb

## 2013-03-25 DIAGNOSIS — E785 Hyperlipidemia, unspecified: Secondary | ICD-10-CM | POA: Diagnosis not present

## 2013-03-25 DIAGNOSIS — F411 Generalized anxiety disorder: Secondary | ICD-10-CM | POA: Diagnosis not present

## 2013-03-25 DIAGNOSIS — I10 Essential (primary) hypertension: Secondary | ICD-10-CM

## 2013-03-25 DIAGNOSIS — D638 Anemia in other chronic diseases classified elsewhere: Secondary | ICD-10-CM

## 2013-03-25 DIAGNOSIS — I2699 Other pulmonary embolism without acute cor pulmonale: Secondary | ICD-10-CM

## 2013-03-25 DIAGNOSIS — F329 Major depressive disorder, single episode, unspecified: Secondary | ICD-10-CM

## 2013-03-25 DIAGNOSIS — R569 Unspecified convulsions: Secondary | ICD-10-CM

## 2013-03-25 DIAGNOSIS — I82409 Acute embolism and thrombosis of unspecified deep veins of unspecified lower extremity: Secondary | ICD-10-CM

## 2013-03-25 MED ORDER — LAMOTRIGINE 100 MG PO TABS: 50.0000 mg | ORAL_TABLET | Freq: Every day | ORAL | Status: DC

## 2013-03-25 NOTE — Patient Instructions (Addendum)
Taper off dilatin 100mg  twice a day now, take one tab every night for one week then stop.  Increase dose of Lamotrigen, you are taking 100mg  1/2 tab twice a day now,  You should take Lamotrigen 100mg  1/2 tab in morning, one tab every night.  Blood test today, will call you report.

## 2013-03-25 NOTE — Progress Notes (Signed)
History of Present Illness:    [name] is a 78 year old right-handed white married female with past medical history of seizures. She was a patient of Dr. Sandria Manly.  She had normal development until age 6 and then began having sezuires occurring 10-12 times per year without warning without urinary or bowel or bladder incontinence. She would "fall out of a chair" with seizures and turn stiff.  EEGs have been consistent with "seizure activity"  and she is currrently treated with lamotrigine 100 mg a half tablet twice daily. She has not had a seizure since 2009.  In 2007 a garage door struck her in the head and she  had  dizziness afterwards. She was evaluated in 12/24/2009 with vertigo. MRI study of the brain with and  without contrast 12/30/2009 showed changes of chronic microvascular ischemia.  MRA of the neck without contrast  and intracranial MRA were normal.   She fell at her daughter's house when she missed a step walking down steps. She fell backwards striking her head on the left side against a metal box and then struck her head on the concrete floor in [place] 2013, CT scan of the brain was negative for bleed or other acute intracranial process.  She was given amitriptyline, which she discontinued because of 5 pounds of weight gain. In 05/2012,  she felt pain in the right temporal and occipital region,  sedimentation rate was 60.  repeat sedimentation rate was 53.  She underwent left temporal artery biopsy in Feb 2014, there was no evidence of inflammation  She complains of memory loss which is worse, forgeting birthdays,  holidays, and special events. She has forgotten appointments.  Her memory was evaluated with neuropsychological evaluation 10/17/10 showing age inappropriate memory with  impairment in stages of acquisition, delayed recall, and retrieval She also demonstrated mildly reduced efficiency on tests of cognitive flexibility, otherwise her cognitive abilities were normal for age. It was felt that  she  had memory loss secondary to depression and/or mild cognitive impairment.   She has recurrent dysphagia and a history of hiatal hernia.She denies seizures or auras.  She has symptoms of arthritis but is able to clean her house, cook, shop, and drive a car. She call indicating the last one to 2 weeks she has pain behind her nose and left eye. She describes  symptoms of pressure and not a headache. She notes increased sensitivity to touch in the left temporal region   UPDATE August 19th 2014: She is taking dilantin 100mg  bid for her seizure,  Also lamotrigen 50mg  bid, last seizure was in 2009. She denies significant side effect from medications.  She continues to have mild headaches,  Last lab in Feb 2014, showed mild anemia, Hg 10.6, CMP, creat 1.24, Lamotrigen 1.6, dilantin 5.6.  She continues to have mild memory loss.  Review of Systems  Out of a complete 14 system review, the patient complains of only the following symptoms, and all other reviewed systems are negative.   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 They have a son, 48 and a daughter, 81. Denies alcohol, tobacco, caffeine and drug use. Patient is right handed.  Inhaled Tobacco Use: never smoker  Family History  Mother died at age 57. Father died at age 77 with COPD and a heart attack.   Past Medical History  Positive for HBP, depression/anxiety,  Right and left  breast cancer, seizures, high cholesterol for ago. history of phlebitis treated with warfarin  Surgical History  Right rotator cuff repair in 2006, hiatal hernia repir. left and right breast surgery  Physical Exam  General: Well-developed white female   Neurologic Exam  Mental Status:  Alert and oriented x3. MMSE 26/30. She missed 3/3 recall, she is not oriented to day.  No aphasia, agnosia, or apraxia Cranial Nerves: Visual fields  full. Discs flat.  Status  post cataract surgery bilaterally,  Extraocular movements full. Visual field is full on confrontational test.  uvula midline, and gag is present .Sternocleidomastoid and trapezius testing normal. Motor:  Normal tone, bulk and strength. Sensory:  Intact to pinprick, touch, joint position, and vibration testing. Coordination:  Outstretched hand and arm tremor Gait and Station:  slightly wide-based. Can walk on her toes, can walk on her heels,and can perform tandem gait.  Reflexes: hyperreflexia and symmetric.  Assessment and Plan: 78 yo female with history of seizure, last recurrent was in 1999, is taking lamictal and dilantin.  1. Will tapering off dilantin, increase lamotrigin 100mg  1/2 tab in am, one in pm. 2. Repeat ESR, CRP 3. RTC with Eber Jones in 3 months

## 2013-03-31 DIAGNOSIS — I10 Essential (primary) hypertension: Secondary | ICD-10-CM | POA: Diagnosis not present

## 2013-03-31 DIAGNOSIS — D638 Anemia in other chronic diseases classified elsewhere: Secondary | ICD-10-CM | POA: Diagnosis not present

## 2013-03-31 DIAGNOSIS — F411 Generalized anxiety disorder: Secondary | ICD-10-CM | POA: Diagnosis not present

## 2013-03-31 DIAGNOSIS — R569 Unspecified convulsions: Secondary | ICD-10-CM | POA: Diagnosis not present

## 2013-03-31 DIAGNOSIS — F329 Major depressive disorder, single episode, unspecified: Secondary | ICD-10-CM | POA: Diagnosis not present

## 2013-03-31 DIAGNOSIS — E785 Hyperlipidemia, unspecified: Secondary | ICD-10-CM | POA: Diagnosis not present

## 2013-03-31 DIAGNOSIS — I82409 Acute embolism and thrombosis of unspecified deep veins of unspecified lower extremity: Secondary | ICD-10-CM | POA: Diagnosis not present

## 2013-03-31 DIAGNOSIS — I2699 Other pulmonary embolism without acute cor pulmonale: Secondary | ICD-10-CM | POA: Diagnosis not present

## 2013-04-01 LAB — THYROID PANEL WITH TSH
Free Thyroxine Index: 1.2 (ref 1.2–4.9)
TSH: 1.74 u[IU]/mL (ref 0.450–4.500)

## 2013-04-01 LAB — SEDIMENTATION RATE: Sed Rate: 11 mm/hr (ref 0–40)

## 2013-04-01 LAB — C-REACTIVE PROTEIN: CRP: 3.2 mg/L (ref 0.0–4.9)

## 2013-04-02 ENCOUNTER — Telehealth: Payer: Self-pay | Admitting: Neurology

## 2013-04-02 MED ORDER — LAMOTRIGINE 100 MG PO TABS: ORAL_TABLET | ORAL | Status: DC

## 2013-04-02 NOTE — Telephone Encounter (Signed)
Pharmacy couldn't fill Rx because it had 2 sets of directions.  I have updated and resent the Rx for 90 day supply.  Patient would also like an earlier appt, message was forwarded to Triage from front desk regarding this.

## 2013-04-03 NOTE — Telephone Encounter (Signed)
I spoke to patient and she relayed that she is having pain and numbness in her head, nose and behind her eyes.  She said the feeling is like a fullness or pressure.  Tylenol helps very little.  She has it when she wakes up and it comes and goes all day.

## 2013-04-03 NOTE — Telephone Encounter (Signed)
I have called [name], left message.

## 2013-04-03 NOTE — Progress Notes (Signed)
Quick Note:  Spoke with patient and gave lab results, per Dr. Terrace Arabia. ______

## 2013-04-03 NOTE — Progress Notes (Signed)
Quick Note:  Please call patient, essential normal lab ______

## 2013-04-04 DIAGNOSIS — J3081 Allergic rhinitis due to animal (cat) (dog) hair and dander: Secondary | ICD-10-CM | POA: Diagnosis not present

## 2013-04-04 DIAGNOSIS — J301 Allergic rhinitis due to pollen: Secondary | ICD-10-CM | POA: Diagnosis not present

## 2013-04-04 DIAGNOSIS — J309 Allergic rhinitis, unspecified: Secondary | ICD-10-CM | POA: Diagnosis not present

## 2013-04-04 DIAGNOSIS — J3089 Other allergic rhinitis: Secondary | ICD-10-CM | POA: Diagnosis not present

## 2013-04-08 NOTE — Telephone Encounter (Signed)
I have called and left message for her cell and home phone second time

## 2013-04-22 ENCOUNTER — Telehealth: Payer: Self-pay | Admitting: Neurology

## 2013-04-23 ENCOUNTER — Telehealth: Payer: Self-pay | Admitting: *Deleted

## 2013-04-23 DIAGNOSIS — I10 Essential (primary) hypertension: Secondary | ICD-10-CM

## 2013-04-23 DIAGNOSIS — F411 Generalized anxiety disorder: Secondary | ICD-10-CM

## 2013-04-23 DIAGNOSIS — R569 Unspecified convulsions: Secondary | ICD-10-CM

## 2013-04-23 DIAGNOSIS — E785 Hyperlipidemia, unspecified: Secondary | ICD-10-CM

## 2013-04-23 DIAGNOSIS — D638 Anemia in other chronic diseases classified elsewhere: Secondary | ICD-10-CM

## 2013-04-23 NOTE — Telephone Encounter (Signed)
Pts daughter calling re: mother having increased falls the last 2 wks.  Med change??  Taper of dilantin and taking lamictal 100mg  tabs (1/2 in am and 1 tab in pm ).  Also feels like memory worse.  Take medication for this?  I relayed that labs done-normal.  She needs to accompany mother when comes in for visits.  This would be good idea, since husband stated only that meds were changed.   I will forward message to Dr. Terrace Arabia to call [name], daughter [phone]

## 2013-04-23 NOTE — Telephone Encounter (Signed)
I have called her daughter,  She has tapered off dilantin now, is taking lamotrigine 100mg  1/2 tab po qam, one po qhs. She has been complaining worsening gait difficulty for 2 weeks,  I have suggested MRI brain asap, came in for lab, cmp, dilantin, lamotrigine level.  Annabelle Harman, please call her appt,ASAP.   DDx including stroke, vs deconditioning, less likely due to medicine side effect. I have suggested her daughter to go to the emergency room for worsening headaches, gait difficulty

## 2013-04-23 NOTE — Telephone Encounter (Signed)
Message copied by Hermenia Fiscal on Wed Apr 23, 2013 12:58 PM ------      Message from: Seth Bake      Created: Wed Apr 23, 2013 10:52 AM       Patients daughter called yesterday and did not get a call back. She is very concerned because her mother keeps falling and this is something that started since the medication change.  Requests a call this morning please.  Call her at . [phone] ------

## 2013-04-24 ENCOUNTER — Other Ambulatory Visit: Payer: Self-pay | Admitting: Neurology

## 2013-04-24 ENCOUNTER — Ambulatory Visit
Admission: RE | Admit: 2013-04-24 | Discharge: 2013-04-24 | Disposition: A | Discharge status: Home / Self Care | Payer: Medicare Other | Source: Ambulatory Visit | Attending: Neurology | Admitting: Neurology

## 2013-04-24 ENCOUNTER — Other Ambulatory Visit (INDEPENDENT_AMBULATORY_CARE_PROVIDER_SITE_OTHER): Payer: Self-pay

## 2013-04-24 ENCOUNTER — Telehealth: Payer: Self-pay

## 2013-04-24 ENCOUNTER — Other Ambulatory Visit: Payer: Self-pay | Admitting: *Deleted

## 2013-04-24 DIAGNOSIS — R569 Unspecified convulsions: Secondary | ICD-10-CM | POA: Diagnosis not present

## 2013-04-24 DIAGNOSIS — F411 Generalized anxiety disorder: Secondary | ICD-10-CM | POA: Diagnosis not present

## 2013-04-24 DIAGNOSIS — D638 Anemia in other chronic diseases classified elsewhere: Secondary | ICD-10-CM

## 2013-04-24 DIAGNOSIS — Z0289 Encounter for other administrative examinations: Secondary | ICD-10-CM

## 2013-04-24 DIAGNOSIS — E785 Hyperlipidemia, unspecified: Secondary | ICD-10-CM

## 2013-04-24 DIAGNOSIS — I10 Essential (primary) hypertension: Secondary | ICD-10-CM

## 2013-04-24 DIAGNOSIS — R51 Headache: Secondary | ICD-10-CM | POA: Diagnosis not present

## 2013-04-24 NOTE — Telephone Encounter (Signed)
Called and spoke to patient and her husband and they don't want to go to ER. Patient states can I come in and see Dr.Yan . I told patient I would give her a call back when Dr.Yan got to the the office today. 04-24-2013 because Dr.Yan did not have any openings until Monday. Dr.Yan please advise me is it ok for her to wait until Monday. Patient was fine to wait for Dr.Yan's Response.

## 2013-04-24 NOTE — Telephone Encounter (Signed)
Called and spoke to patient and she is to keep her MRI apt. For today 04-24-2013. And she will see Dr.Yan in the office Monday 04-28-2013. Patient understood.

## 2013-04-24 NOTE — Addendum Note (Signed)
Addended byHermenia Fiscal on: 04/24/2013 02:43 PM   Modules accepted: Orders

## 2013-04-24 NOTE — Telephone Encounter (Signed)
It is Ok to come in Monday 04/24/2013

## 2013-04-25 DIAGNOSIS — Z7901 Long term (current) use of anticoagulants: Secondary | ICD-10-CM | POA: Diagnosis not present

## 2013-04-25 DIAGNOSIS — Z23 Encounter for immunization: Secondary | ICD-10-CM | POA: Diagnosis not present

## 2013-04-25 DIAGNOSIS — I82409 Acute embolism and thrombosis of unspecified deep veins of unspecified lower extremity: Secondary | ICD-10-CM | POA: Diagnosis not present

## 2013-04-26 LAB — CBC
HCT: 30.9 % — ABNORMAL LOW (ref 34.0–46.6)
Hemoglobin: 10 g/dL — ABNORMAL LOW (ref 11.1–15.9)
MCH: 30.4 pg (ref 26.6–33.0)
MCV: 94 fL (ref 79–97)
RBC: 3.29 x10E6/uL — ABNORMAL LOW (ref 3.77–5.28)
WBC: 6.3 10*3/uL (ref 3.4–10.8)

## 2013-04-26 LAB — COMPREHENSIVE METABOLIC PANEL
Albumin: 4.4 g/dL (ref 3.5–4.8)
BUN: 21 mg/dL (ref 8–27)
CO2: 26 mmol/L (ref 18–29)
Calcium: 9.4 mg/dL (ref 8.6–10.2)
Chloride: 103 mmol/L (ref 97–108)
Creatinine, Ser: 1.25 mg/dL — ABNORMAL HIGH (ref 0.57–1.00)
Globulin, Total: 2.4 g/dL (ref 1.5–4.5)
Glucose: 90 mg/dL (ref 65–99)
Total Protein: 6.8 g/dL (ref 6.0–8.5)

## 2013-04-26 LAB — PHENYTOIN LEVEL, TOTAL: Phenytoin Lvl: <0.6 ug/mL — ABNORMAL LOW (ref 10.0–20.0)

## 2013-04-28 ENCOUNTER — Encounter: Payer: Self-pay | Admitting: Neurology

## 2013-04-28 ENCOUNTER — Ambulatory Visit (INDEPENDENT_AMBULATORY_CARE_PROVIDER_SITE_OTHER): Payer: Medicare Other | Admitting: Neurology

## 2013-04-28 VITALS — BP 124/60 | HR 62 | Ht 61.0 in | Wt 160.5 lb

## 2013-04-28 DIAGNOSIS — M542 Cervicalgia: Secondary | ICD-10-CM | POA: Insufficient documentation

## 2013-04-28 DIAGNOSIS — R269 Unspecified abnormalities of gait and mobility: Secondary | ICD-10-CM

## 2013-04-28 DIAGNOSIS — E785 Hyperlipidemia, unspecified: Secondary | ICD-10-CM | POA: Diagnosis not present

## 2013-04-28 DIAGNOSIS — R413 Other amnesia: Secondary | ICD-10-CM | POA: Diagnosis not present

## 2013-04-28 DIAGNOSIS — R569 Unspecified convulsions: Secondary | ICD-10-CM

## 2013-04-28 NOTE — Progress Notes (Signed)
[name] is a 78 year old right-handed white married female with past medical history of seizures. She was a patient of Dr. Sandria Manly, accompanied by her daughter [name]  She was seen by Dr. love for seizure, she began to have seizure at 78years old, occurring 10-12 times per year without warning, without urinary or bowel or bladder incontinence. She would "fall out of a chair" with seizures and turn stiff.   EEGs have been consistent with "seizure activity"  and she is currrently treated with lamotrigine, and Dilantin. She has not had a seizure since 2009.  In 2007 a garage door struck her in the head and she  had  dizziness afterwards. She was evaluated in 12/24/2009 with vertigo. MRI study of the brain with and  without contrast 12/30/2009 showed changes of chronic microvascular ischemia.  MRA of the neck without contrast  and intracranial MRA were normal.   She fell at her daughter's house when she missed a step walking down steps. She fell backwards striking her head on the left side against a metal box and then struck her head on the concrete floor in early 2013, CT scan of the brain was negative for bleed or other acute intracranial process.    She complains of memory loss which is worse, forgeting birthdays,  holidays, and special events. She has forgotten appointments.  Her memory was evaluated with neuropsychological evaluation 10/17/10 showing age inappropriate memory with  impairment in stages of acquisition, delayed recall, and retrieval She also demonstrated mildly reduced efficiency on tests of cognitive flexibility, otherwise her cognitive abilities were normal for age. It was felt that she  had memory loss secondary to depression and/or mild cognitive impairment.   UPDATE Sept 22nd 2014:  I saw her in August 2014, I tapered her off Dilantin, with higher dose of lamotrigine, 50 mg in the morning, 100 mg at evening, her Lamictal level is 5.2 she is with her daughter at today's clinical  visit, reported that she has increased unsteady gait, she has no bowel bladder incontinence, She also has mild worsening memory trouble,  We have reviewed MRI of the brain together, there is evidence of mild small vessel disease, no acute lesions   She also complains of worsening neck pain, occipital area pain,  Review of Systems  Out of a complete 14 system review, the patient complains of only the following symptoms, and all other reviewed systems are negative.   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 They have a son, 37 and a daughter, 79. Denies alcohol, tobacco, caffeine and drug use. Patient is right handed.  Inhaled Tobacco Use: never smoker  Family History  Mother died at age 17. Father died at age 48 with COPD and a heart attack.   Past Medical History  Positive for HBP, depression/anxiety,  Right and left breast cancer, seizures, high cholesterol for ago. history of phlebitis treated with warfarin  Surgical History  Right rotator cuff repair in 2006, hiatal hernia repir. left and right breast surgery  PHYSICAL EXAMINATOINS:  Generalized: In no acute distress  Neck: Supple, no carotid bruits   Cardiac: Regular rate rhythm  Pulmonary: Clear to auscultation bilaterally  Musculoskeletal: No deformity  Neurological examination  Mentation: Alert oriented to time, place, history taking, and causual conversation, MMSE 26/30, she missed 3/3 recall, is not oriented to date.  Cranial nerve II-XII: Pupils were equal  round reactive to light extraocular movements were full, visual field were full on confrontational test. facial sensation and strength were normal. hearing was intact to finger rubbing bilaterally. Uvula tongue midline.  head turning and shoulder shrug and were normal and symmetric.Tongue protrusion into cheek strength was normal.  Motor: normal tone, bulk and  strength.  Sensory: Intact to fine touch, pinprick, preserved vibratory sensation, and proprioception at toes.  Coordination: Normal finger to nose, heel-to-shin bilaterally there was no truncal ataxia  Gait: wide based, mildly unsteady, mild difficulty with tandem walking  Romberg signs: Negative  Deep tendon reflexes: Brachioradialis 3/3, biceps 3/3, triceps 3/3, patellar 3/3, Achilles 2/2, plantar responses were flexor bilaterally.  Assessment and Plan: 78 yo female with history of seizure, last recurrent was in 2009,  mild unsteady gait, complains of worsening neck pain, bilateral upper and lower extremity hyperreflexia. Mild short-term memory trouble consistent with mild cognitive dysfunction, If she continues to complain of worsening memory trouble, I may consider add  on the Donepezil    1 need to rule out cervical spondylitic myelopathy. 2 MRI of the cervical spine. 3.She is to continue moderate exercise, return to clinic in 6 months

## 2013-05-08 ENCOUNTER — Ambulatory Visit
Admission: RE | Admit: 2013-05-08 | Discharge: 2013-05-08 | Disposition: A | Discharge status: Home / Self Care | Payer: Medicare Other | Source: Ambulatory Visit | Attending: Neurology | Admitting: Neurology

## 2013-05-08 DIAGNOSIS — Z7901 Long term (current) use of anticoagulants: Secondary | ICD-10-CM | POA: Diagnosis not present

## 2013-05-08 DIAGNOSIS — R269 Unspecified abnormalities of gait and mobility: Secondary | ICD-10-CM

## 2013-05-08 DIAGNOSIS — M542 Cervicalgia: Secondary | ICD-10-CM

## 2013-05-08 DIAGNOSIS — R413 Other amnesia: Secondary | ICD-10-CM

## 2013-05-08 DIAGNOSIS — R209 Unspecified disturbances of skin sensation: Secondary | ICD-10-CM | POA: Diagnosis not present

## 2013-05-08 DIAGNOSIS — I82409 Acute embolism and thrombosis of unspecified deep veins of unspecified lower extremity: Secondary | ICD-10-CM | POA: Diagnosis not present

## 2013-05-08 DIAGNOSIS — E785 Hyperlipidemia, unspecified: Secondary | ICD-10-CM

## 2013-05-08 DIAGNOSIS — R569 Unspecified convulsions: Secondary | ICD-10-CM

## 2013-05-12 NOTE — Progress Notes (Signed)
Quick Note:  Please call patient, only mild cervical degenerative disc disease, no cord or nerve compression ______

## 2013-05-16 ENCOUNTER — Telehealth: Payer: Self-pay

## 2013-05-16 NOTE — Telephone Encounter (Signed)
Message copied by [facility] on Fri May 16, 2013  4:30 PM ------      Message from: Levert Feinstein      Created: Mon May 12, 2013  4:27 PM       Please call patient, only mild cervical degenerative disc disease, no cord or nerve compression ------

## 2013-05-16 NOTE — Telephone Encounter (Signed)
I called patient at both numbers and left VM on her cell home. I let her know there is only mild cervical degenerative disc disease. There is no cord or nerve compression. Should patient have any questions or concerns, please call us at [phone].

## 2013-05-22 ENCOUNTER — Telehealth: Payer: Self-pay | Admitting: Neurology

## 2013-05-22 NOTE — Telephone Encounter (Signed)
Called patient and informed the results that had been previously left by nurse Valerie(per Dr Zannie Cove notes), no answer, left voicemail message.

## 2013-05-23 ENCOUNTER — Telehealth: Payer: Self-pay | Admitting: Neurology

## 2013-05-23 DIAGNOSIS — M542 Cervicalgia: Secondary | ICD-10-CM

## 2013-05-23 NOTE — Telephone Encounter (Signed)
Patient's daughter(Patsy) would like to know if there were any bone spurs or arthritis in the back of neck/head(hurts),could it be something going on in the lumbar area.  When she sits in chair back of neck /head hurts.

## 2013-05-27 NOTE — Telephone Encounter (Signed)
I have called her daughter Lura Em, MRI cervical showed mild degenerative disease.  I have advised her to physical therapy, heating pad.

## 2013-05-30 DIAGNOSIS — E785 Hyperlipidemia, unspecified: Secondary | ICD-10-CM | POA: Diagnosis not present

## 2013-05-30 DIAGNOSIS — I10 Essential (primary) hypertension: Secondary | ICD-10-CM | POA: Diagnosis not present

## 2013-05-30 DIAGNOSIS — E669 Obesity, unspecified: Secondary | ICD-10-CM | POA: Diagnosis not present

## 2013-05-30 DIAGNOSIS — G40401 Other generalized epilepsy and epileptic syndromes, not intractable, with status epilepticus: Secondary | ICD-10-CM | POA: Diagnosis not present

## 2013-05-30 DIAGNOSIS — Z853 Personal history of malignant neoplasm of breast: Secondary | ICD-10-CM | POA: Diagnosis not present

## 2013-05-30 DIAGNOSIS — Z86718 Personal history of other venous thrombosis and embolism: Secondary | ICD-10-CM | POA: Diagnosis not present

## 2013-05-30 DIAGNOSIS — Z7901 Long term (current) use of anticoagulants: Secondary | ICD-10-CM | POA: Diagnosis not present

## 2013-05-30 DIAGNOSIS — I2510 Atherosclerotic heart disease of native coronary artery without angina pectoris: Secondary | ICD-10-CM | POA: Diagnosis not present

## 2013-06-10 DIAGNOSIS — I82409 Acute embolism and thrombosis of unspecified deep veins of unspecified lower extremity: Secondary | ICD-10-CM | POA: Diagnosis not present

## 2013-06-10 DIAGNOSIS — Z7901 Long term (current) use of anticoagulants: Secondary | ICD-10-CM | POA: Diagnosis not present

## 2013-06-25 ENCOUNTER — Ambulatory Visit: Payer: Medicare Other | Admitting: Nurse Practitioner

## 2013-07-09 DIAGNOSIS — Z23 Encounter for immunization: Secondary | ICD-10-CM | POA: Diagnosis not present

## 2013-07-09 DIAGNOSIS — I82409 Acute embolism and thrombosis of unspecified deep veins of unspecified lower extremity: Secondary | ICD-10-CM | POA: Diagnosis not present

## 2013-07-09 DIAGNOSIS — Z7901 Long term (current) use of anticoagulants: Secondary | ICD-10-CM | POA: Diagnosis not present

## 2013-07-21 DIAGNOSIS — R7301 Impaired fasting glucose: Secondary | ICD-10-CM | POA: Diagnosis not present

## 2013-07-21 DIAGNOSIS — I82409 Acute embolism and thrombosis of unspecified deep veins of unspecified lower extremity: Secondary | ICD-10-CM | POA: Diagnosis not present

## 2013-07-21 DIAGNOSIS — E785 Hyperlipidemia, unspecified: Secondary | ICD-10-CM | POA: Diagnosis not present

## 2013-07-21 DIAGNOSIS — I2510 Atherosclerotic heart disease of native coronary artery without angina pectoris: Secondary | ICD-10-CM | POA: Diagnosis not present

## 2013-07-21 DIAGNOSIS — I10 Essential (primary) hypertension: Secondary | ICD-10-CM | POA: Diagnosis not present

## 2013-07-21 DIAGNOSIS — R269 Unspecified abnormalities of gait and mobility: Secondary | ICD-10-CM | POA: Diagnosis not present

## 2013-07-21 DIAGNOSIS — Z7901 Long term (current) use of anticoagulants: Secondary | ICD-10-CM | POA: Diagnosis not present

## 2013-08-13 DIAGNOSIS — Z7901 Long term (current) use of anticoagulants: Secondary | ICD-10-CM | POA: Diagnosis not present

## 2013-08-13 DIAGNOSIS — I82409 Acute embolism and thrombosis of unspecified deep veins of unspecified lower extremity: Secondary | ICD-10-CM | POA: Diagnosis not present

## 2013-09-04 DIAGNOSIS — Z961 Presence of intraocular lens: Secondary | ICD-10-CM | POA: Diagnosis not present

## 2013-09-04 DIAGNOSIS — H02839 Dermatochalasis of unspecified eye, unspecified eyelid: Secondary | ICD-10-CM | POA: Diagnosis not present

## 2013-09-16 DIAGNOSIS — R05 Cough: Secondary | ICD-10-CM | POA: Diagnosis not present

## 2013-09-16 DIAGNOSIS — I82409 Acute embolism and thrombosis of unspecified deep veins of unspecified lower extremity: Secondary | ICD-10-CM | POA: Diagnosis not present

## 2013-09-16 DIAGNOSIS — R059 Cough, unspecified: Secondary | ICD-10-CM | POA: Diagnosis not present

## 2013-09-16 DIAGNOSIS — Z6829 Body mass index (BMI) 29.0-29.9, adult: Secondary | ICD-10-CM | POA: Diagnosis not present

## 2013-09-16 DIAGNOSIS — Z7901 Long term (current) use of anticoagulants: Secondary | ICD-10-CM | POA: Diagnosis not present

## 2013-09-16 DIAGNOSIS — J069 Acute upper respiratory infection, unspecified: Secondary | ICD-10-CM | POA: Diagnosis not present

## 2013-10-23 ENCOUNTER — Encounter: Payer: Self-pay | Admitting: Neurology

## 2013-10-23 ENCOUNTER — Ambulatory Visit: Payer: Medicare Other | Admitting: Neurology

## 2013-10-23 ENCOUNTER — Ambulatory Visit (INDEPENDENT_AMBULATORY_CARE_PROVIDER_SITE_OTHER): Payer: Medicare Other | Admitting: Neurology

## 2013-10-23 ENCOUNTER — Encounter (INDEPENDENT_AMBULATORY_CARE_PROVIDER_SITE_OTHER): Payer: Self-pay

## 2013-10-23 VITALS — BP 147/56 | HR 62 | Ht 61.0 in | Wt 158.5 lb

## 2013-10-23 DIAGNOSIS — R269 Unspecified abnormalities of gait and mobility: Secondary | ICD-10-CM

## 2013-10-23 DIAGNOSIS — I639 Cerebral infarction, unspecified: Secondary | ICD-10-CM

## 2013-10-23 DIAGNOSIS — I82409 Acute embolism and thrombosis of unspecified deep veins of unspecified lower extremity: Secondary | ICD-10-CM | POA: Diagnosis not present

## 2013-10-23 DIAGNOSIS — I6350 Cerebral infarction due to unspecified occlusion or stenosis of unspecified cerebral artery: Secondary | ICD-10-CM

## 2013-10-23 DIAGNOSIS — Z7901 Long term (current) use of anticoagulants: Secondary | ICD-10-CM | POA: Diagnosis not present

## 2013-10-23 MED ORDER — DONEPEZIL HCL 10 MG PO TABS: 10.0000 mg | ORAL_TABLET | Freq: Every day | ORAL | Status: DC

## 2013-10-23 NOTE — Addendum Note (Signed)
Addended by: Marcial Pacas on: 10/23/2013 02:54 PM   Modules accepted: Orders

## 2013-10-23 NOTE — Progress Notes (Addendum)
[name] is a 79 year old right-handed white married female with past medical history of seizures. She was a patient of Dr. Erling Cruz, accompanied by her daughter [name]  She began to have seizure at 79 years old, occurring 10-12 times per year without warning, without urinary or bowel or bladder incontinence. She would "fall out of a chair" with seizures and turn stiff.   EEGs have been consistent with "seizure activity"  and she is currrently treated with lamotrigine, and Dilantin. She has not had a seizure since 2009.  In 2007 a garage door struck her in the head and she  had  dizziness afterwards. She was evaluated in 12/24/2009 with vertigo.  MRI study of the brain with and  without contrast 12/30/2009 showed changes of chronic microvascular ischemia.  MRA of the neck without contrast  and intracranial MRA were normal.   She fell at her daughter's house when she missed a step walking down steps. She fell backwards striking her head on the left side against a metal box and then struck her head on the concrete floor in early 2013, CT scan of the brain was negative for bleed or other acute intracranial process.    She complains of memory loss which is worse, forgeting birthdays,  holidays, and special events. She has forgotten appointments.  Her memory was evaluated with neuropsychological evaluation 10/17/10 showing age inappropriate memory with  impairment in stages of acquisition, delayed recall, and retrieval She also demonstrated mildly reduced efficiency on tests of cognitive flexibility, otherwise her cognitive abilities were normal for age. It was felt that she  had memory loss secondary to depression and/or mild cognitive impairment.   UPDATE Sept 22nd 2014:  I saw her in August 2014, I tapered her off Dilantin, with higher dose of lamotrigine, 50 mg in the morning, 100 mg at evening, her Lamictal level is 5.2 she is with her daughter at today's clinical visit, reported that she has increased  unsteady gait, she has no bowel bladder incontinence, She also has mild worsening memory trouble,  We have reviewed MRI of the brain together, there is evidence of mild small vessel disease, no acute lesions   She also complains of worsening neck pain, occipital area pain,  UPDATE March 19th 2015:  She has no recurrent seizure, she is taking lamotrigine 100mg  1/2 in the morning and one at night,    We have reviewed MRI cervical together, there was mild degenerative disc disease, no canal stenosis, she has mild low back pain,  She has shooting pain to her left leg, she has on bowel and bladder incontinence,    Review of Systems  Out of a complete 14 system review, the patient complains of only the following symptoms, and all other reviewed systems are negative.   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 Lives at home with her husband, Sonia Side. They have a son, 18 and a daughter, 4. Denies alcohol, tobacco, caffeine and drug use. Patient is right handed.  Inhaled Tobacco Use: never smoker  Family History  Mother died at age 85. Father died at age 72 with COPD and a heart attack.   Past Medical History  Positive for HBP, depression/anxiety,  Right and left breast cancer, seizures, high cholesterol for ago. history of phlebitis treated with warfarin  Surgical History  Right rotator cuff repair in 2006, hiatal hernia repir. left and right breast surgery  PHYSICAL EXAMINATOINS:  Generalized: In no acute distress  Neck: Supple, no carotid bruits   Cardiac: Regular rate rhythm  Pulmonary: Clear to auscultation bilaterally  Musculoskeletal: No deformity  Neurological examination  Mentation: Alert oriented to time, place, history taking, and causual conversation, MMSE 27/30, she missed 3/3 recall  Cranial nerve II-XII: Pupils were equal round reactive to light extraocular movements were full, visual field were full  on confrontational test. facial sensation and strength were normal. hearing was intact to finger rubbing bilaterally. Uvula tongue midline.  head turning and shoulder shrug and were normal and symmetric.Tongue protrusion into cheek strength was normal.  Motor: normal tone, bulk and strength.  Sensory: Intact to fine touch, pinprick, preserved vibratory sensation, and proprioception at toes.  Coordination: Normal finger to nose, heel-to-shin bilaterally there was no truncal ataxia  Gait: cautious, stiff gait, mildly unsteady, mild difficulty with tandem walking  Romberg signs: Negative  Deep tendon reflexes: Brachioradialis 3/3, biceps 3/3, triceps 3/3, patellar 3/3, Achilles 2/2, plantar responses were extensor bilaterally.  Assessment and Plan: 79 yo female with history of seizure, last recurrent was in 2009,  mild unsteady gait, complains of worsening neck pain, bilateral upper and lower extremity hyperreflexia. Mild short-term memory trouble consistent with mild cognitive dysfunction,  she continues to complain of mild memory trouble MMSE 27/30,  There was no evidence of cervical stenosis  1. Proceed with MRI thoracic and Lumbar spine. 2. Physical therapy. 3. RTC In 4-6 months

## 2013-11-01 ENCOUNTER — Ambulatory Visit
Admission: RE | Admit: 2013-11-01 | Discharge: 2013-11-01 | Disposition: A | Discharge status: Home / Self Care | Payer: Medicare Other | Source: Ambulatory Visit | Attending: Neurology | Admitting: Neurology

## 2013-11-01 DIAGNOSIS — R269 Unspecified abnormalities of gait and mobility: Secondary | ICD-10-CM | POA: Diagnosis not present

## 2013-11-01 DIAGNOSIS — M47817 Spondylosis without myelopathy or radiculopathy, lumbosacral region: Secondary | ICD-10-CM | POA: Diagnosis not present

## 2013-11-01 DIAGNOSIS — M4120 Other idiopathic scoliosis, site unspecified: Secondary | ICD-10-CM | POA: Diagnosis not present

## 2013-11-01 DIAGNOSIS — M5137 Other intervertebral disc degeneration, lumbosacral region: Secondary | ICD-10-CM | POA: Diagnosis not present

## 2013-11-01 DIAGNOSIS — I639 Cerebral infarction, unspecified: Secondary | ICD-10-CM

## 2013-11-03 DIAGNOSIS — Z7901 Long term (current) use of anticoagulants: Secondary | ICD-10-CM | POA: Diagnosis not present

## 2013-11-03 DIAGNOSIS — I82409 Acute embolism and thrombosis of unspecified deep veins of unspecified lower extremity: Secondary | ICD-10-CM | POA: Diagnosis not present

## 2013-11-04 NOTE — Progress Notes (Signed)
Quick Note:  Please advise patient that her lumbar spine MRI showed mild degenerative changes. Star Age, MD, PhD [facility] ([facility]  ______

## 2013-11-11 NOTE — Progress Notes (Signed)
Quick Note:  Left message that MRI thoracic was unremarkable, per Dr. Leonie Man. Told to call with any questions. ______

## 2013-11-20 DIAGNOSIS — I82409 Acute embolism and thrombosis of unspecified deep veins of unspecified lower extremity: Secondary | ICD-10-CM | POA: Diagnosis not present

## 2013-11-20 DIAGNOSIS — Z7901 Long term (current) use of anticoagulants: Secondary | ICD-10-CM | POA: Diagnosis not present

## 2013-11-24 ENCOUNTER — Telehealth: Payer: Self-pay | Admitting: Neurology

## 2013-11-24 NOTE — Telephone Encounter (Signed)
Patient called to state that the Donepezil script that Dr. Krista Blue prescribed for her has been making her very sick, patient states she has been taking it for a month now and even tried to cut them in half and take only half. Please call and advise patient.

## 2013-11-24 NOTE — Telephone Encounter (Signed)
I called the patient back.  She said while taking Donepezil, she has had diarrhea and has been vomiting daily.  Says she decided to stop taking the medication because she had family in town and did not want to be ill while they were here.  While off the medication she felt better.  Once they left, she started take one half tab daily (instead of one full tab), however, the side effects returned.  She would like to know if the medication should be changed.  Please advise.

## 2013-11-25 NOTE — Telephone Encounter (Signed)
[name]: Please call patient she should stop taking donepezil, we will talk about medication options in her next followup

## 2013-11-26 ENCOUNTER — Telehealth: Payer: Self-pay | Admitting: Neurology

## 2013-11-26 MED ORDER — MEMANTINE HCL ER 7 & 14 & 21 &28 MG PO CP24
ORAL_CAPSULE | ORAL | Status: DC
Start: 2013-11-26 — End: 2014-04-08

## 2013-11-26 MED ORDER — MEMANTINE HCL ER 28 MG PO CP24: 28.0000 mg | ORAL_CAPSULE | Freq: Every day | ORAL | Status: DC

## 2013-11-26 NOTE — Telephone Encounter (Signed)
I called patient. The patient indicated that she had are been contacted by Dr. Krista Blue, told to stay off the medication, Aricept.

## 2013-11-26 NOTE — Telephone Encounter (Signed)
Pt's daughter Tessie Fass calling stating that the pt has been sick while taking donepezil, stating that pt has been vomiting for 10 days straight, before stopping medication for 3 days and felt better. Daughter states that pt started taking medication again and pt started vomiting again. Daughter would like Dr. Krista Blue to give her a call back please. Thanks

## 2013-11-26 NOTE — Telephone Encounter (Signed)
I have called her daughter, she has worsening memory trouble, difficulty focusing, very bothersome for her emotionally. She could not tolerate donepezil GI side effects,, is not taking it anymore,  I have ordered Namenda titration package, and then Namenda xr 28 mg a day

## 2013-11-26 NOTE — Telephone Encounter (Signed)
Patient's daughter calling wanting to discuss just how sick her mother has been on donepezil. Patient vomited for 10 days straight before stopping it for 3 days and felt better. Patient tried taking the medication again but has been vomiting and severely sick. Daughter would like to talk to a nurse.

## 2013-11-26 NOTE — Telephone Encounter (Signed)
Spoke to patient's daughter and relayed to have patient stop taking the donepezil.  The patient's appointment is not until September and she would like to start her on something sooner.  She asked if the doctor would prescribe something else, or does she need an appointment to discuss.

## 2013-11-28 DIAGNOSIS — Z7901 Long term (current) use of anticoagulants: Secondary | ICD-10-CM | POA: Diagnosis not present

## 2013-11-28 DIAGNOSIS — Z6829 Body mass index (BMI) 29.0-29.9, adult: Secondary | ICD-10-CM | POA: Diagnosis not present

## 2013-11-28 DIAGNOSIS — R269 Unspecified abnormalities of gait and mobility: Secondary | ICD-10-CM | POA: Diagnosis not present

## 2013-11-28 DIAGNOSIS — E785 Hyperlipidemia, unspecified: Secondary | ICD-10-CM | POA: Diagnosis not present

## 2013-11-28 DIAGNOSIS — R7301 Impaired fasting glucose: Secondary | ICD-10-CM | POA: Diagnosis not present

## 2013-11-28 DIAGNOSIS — I82409 Acute embolism and thrombosis of unspecified deep veins of unspecified lower extremity: Secondary | ICD-10-CM | POA: Diagnosis not present

## 2013-11-28 DIAGNOSIS — I10 Essential (primary) hypertension: Secondary | ICD-10-CM | POA: Diagnosis not present

## 2013-12-04 ENCOUNTER — Ambulatory Visit: Payer: Medicare Other | Admitting: Nurse Practitioner

## 2014-01-07 DIAGNOSIS — I82409 Acute embolism and thrombosis of unspecified deep veins of unspecified lower extremity: Secondary | ICD-10-CM | POA: Diagnosis not present

## 2014-01-07 DIAGNOSIS — Z7901 Long term (current) use of anticoagulants: Secondary | ICD-10-CM | POA: Diagnosis not present

## 2014-01-16 DIAGNOSIS — R413 Other amnesia: Secondary | ICD-10-CM | POA: Diagnosis not present

## 2014-01-21 DIAGNOSIS — Z7901 Long term (current) use of anticoagulants: Secondary | ICD-10-CM | POA: Diagnosis not present

## 2014-01-21 DIAGNOSIS — I82409 Acute embolism and thrombosis of unspecified deep veins of unspecified lower extremity: Secondary | ICD-10-CM | POA: Diagnosis not present

## 2014-02-04 DIAGNOSIS — Z7901 Long term (current) use of anticoagulants: Secondary | ICD-10-CM | POA: Diagnosis not present

## 2014-02-04 DIAGNOSIS — Z79899 Other long term (current) drug therapy: Secondary | ICD-10-CM | POA: Diagnosis not present

## 2014-02-04 DIAGNOSIS — I82409 Acute embolism and thrombosis of unspecified deep veins of unspecified lower extremity: Secondary | ICD-10-CM | POA: Diagnosis not present

## 2014-02-04 DIAGNOSIS — I10 Essential (primary) hypertension: Secondary | ICD-10-CM | POA: Diagnosis not present

## 2014-02-04 DIAGNOSIS — E785 Hyperlipidemia, unspecified: Secondary | ICD-10-CM | POA: Diagnosis not present

## 2014-02-04 DIAGNOSIS — R7301 Impaired fasting glucose: Secondary | ICD-10-CM | POA: Diagnosis not present

## 2014-02-04 DIAGNOSIS — R82998 Other abnormal findings in urine: Secondary | ICD-10-CM | POA: Diagnosis not present

## 2014-02-10 DIAGNOSIS — R413 Other amnesia: Secondary | ICD-10-CM | POA: Diagnosis not present

## 2014-02-11 ENCOUNTER — Telehealth: Payer: Self-pay | Admitting: Neurology

## 2014-02-11 NOTE — Telephone Encounter (Signed)
Patsy (patient's daughter) said that the name of the patch is Excelon

## 2014-02-11 NOTE — Telephone Encounter (Signed)
[name], please move up her appt to my next available for discussion.

## 2014-02-11 NOTE — Telephone Encounter (Signed)
Spoke with daughter and she said that patient had a 3 hour test with Dr Pollard([facility]), and he said that patient has beginning stages of Alzheimers and needs to be on some type of medication. He will send the notes of the test to Dr Krista Blue.  Patient is currently not on any medication for memory and she would like to try the patch-(does not know the name).

## 2014-02-11 NOTE — Telephone Encounter (Signed)
Patient's daughter Tessie Fass calling to state that patient would like to try a new medication for her memory, comes in a patch form. Please return call to daughter and advise.

## 2014-02-11 NOTE — Telephone Encounter (Signed)
Called patient and left message to call me back for a sooner apt.

## 2014-02-12 NOTE — Telephone Encounter (Signed)
Called pt and spoke with pt's daughter Tessie Fass to set up an appt on 02/19/14 with Dr. Krista Blue. I advised the pt's daughter that if the pt has any other problems, questions or concerns to call the office. Daughter verbalized understanding.

## 2014-02-12 NOTE — Telephone Encounter (Signed)
Patsy, daughter calling to state that she would like to be called regarding the sooner appointment, she can be reached at [phone].

## 2014-02-13 DIAGNOSIS — R269 Unspecified abnormalities of gait and mobility: Secondary | ICD-10-CM | POA: Diagnosis not present

## 2014-02-13 DIAGNOSIS — F0280 Dementia in other diseases classified elsewhere without behavioral disturbance: Secondary | ICD-10-CM | POA: Diagnosis not present

## 2014-02-13 DIAGNOSIS — R51 Headache: Secondary | ICD-10-CM | POA: Diagnosis not present

## 2014-02-13 DIAGNOSIS — G40909 Epilepsy, unspecified, not intractable, without status epilepticus: Secondary | ICD-10-CM | POA: Diagnosis not present

## 2014-02-13 DIAGNOSIS — F411 Generalized anxiety disorder: Secondary | ICD-10-CM | POA: Diagnosis not present

## 2014-02-13 DIAGNOSIS — G4700 Insomnia, unspecified: Secondary | ICD-10-CM | POA: Diagnosis not present

## 2014-02-16 DIAGNOSIS — Z0000 Encounter for general adult medical examination without abnormal findings: Secondary | ICD-10-CM | POA: Diagnosis not present

## 2014-02-16 DIAGNOSIS — I10 Essential (primary) hypertension: Secondary | ICD-10-CM | POA: Diagnosis not present

## 2014-02-16 DIAGNOSIS — Z1331 Encounter for screening for depression: Secondary | ICD-10-CM | POA: Diagnosis not present

## 2014-02-16 DIAGNOSIS — D649 Anemia, unspecified: Secondary | ICD-10-CM | POA: Diagnosis not present

## 2014-02-16 DIAGNOSIS — R7301 Impaired fasting glucose: Secondary | ICD-10-CM | POA: Diagnosis not present

## 2014-02-16 DIAGNOSIS — E785 Hyperlipidemia, unspecified: Secondary | ICD-10-CM | POA: Diagnosis not present

## 2014-02-16 DIAGNOSIS — F0280 Dementia in other diseases classified elsewhere without behavioral disturbance: Secondary | ICD-10-CM | POA: Diagnosis not present

## 2014-02-16 DIAGNOSIS — I2510 Atherosclerotic heart disease of native coronary artery without angina pectoris: Secondary | ICD-10-CM | POA: Diagnosis not present

## 2014-02-16 DIAGNOSIS — I82409 Acute embolism and thrombosis of unspecified deep veins of unspecified lower extremity: Secondary | ICD-10-CM | POA: Diagnosis not present

## 2014-02-16 DIAGNOSIS — G309 Alzheimer's disease, unspecified: Secondary | ICD-10-CM | POA: Diagnosis not present

## 2014-02-19 ENCOUNTER — Ambulatory Visit: Payer: Self-pay | Admitting: Neurology

## 2014-03-12 NOTE — Telephone Encounter (Signed)
Noted  

## 2014-03-20 ENCOUNTER — Encounter: Payer: Self-pay | Admitting: Internal Medicine

## 2014-03-20 ENCOUNTER — Encounter: Payer: Self-pay | Admitting: Physician Assistant

## 2014-03-25 DIAGNOSIS — M810 Age-related osteoporosis without current pathological fracture: Secondary | ICD-10-CM | POA: Diagnosis not present

## 2014-03-25 DIAGNOSIS — Z7901 Long term (current) use of anticoagulants: Secondary | ICD-10-CM | POA: Diagnosis not present

## 2014-03-25 DIAGNOSIS — I82409 Acute embolism and thrombosis of unspecified deep veins of unspecified lower extremity: Secondary | ICD-10-CM | POA: Diagnosis not present

## 2014-03-27 DIAGNOSIS — M545 Low back pain, unspecified: Secondary | ICD-10-CM | POA: Diagnosis not present

## 2014-03-27 DIAGNOSIS — Z6830 Body mass index (BMI) 30.0-30.9, adult: Secondary | ICD-10-CM | POA: Diagnosis not present

## 2014-03-27 DIAGNOSIS — F0280 Dementia in other diseases classified elsewhere without behavioral disturbance: Secondary | ICD-10-CM | POA: Diagnosis not present

## 2014-03-27 DIAGNOSIS — G309 Alzheimer's disease, unspecified: Secondary | ICD-10-CM | POA: Diagnosis not present

## 2014-03-27 DIAGNOSIS — I10 Essential (primary) hypertension: Secondary | ICD-10-CM | POA: Diagnosis not present

## 2014-03-27 DIAGNOSIS — S4980XA Other specified injuries of shoulder and upper arm, unspecified arm, initial encounter: Secondary | ICD-10-CM | POA: Diagnosis not present

## 2014-03-27 DIAGNOSIS — S46909A Unspecified injury of unspecified muscle, fascia and tendon at shoulder and upper arm level, unspecified arm, initial encounter: Secondary | ICD-10-CM | POA: Diagnosis not present

## 2014-04-08 ENCOUNTER — Encounter: Payer: Self-pay | Admitting: Physician Assistant

## 2014-04-08 ENCOUNTER — Ambulatory Visit (INDEPENDENT_AMBULATORY_CARE_PROVIDER_SITE_OTHER): Payer: Medicare Other | Admitting: Physician Assistant

## 2014-04-08 VITALS — BP 122/62 | HR 56 | Ht 61.0 in | Wt 160.8 lb

## 2014-04-08 DIAGNOSIS — D689 Coagulation defect, unspecified: Secondary | ICD-10-CM | POA: Diagnosis not present

## 2014-04-08 DIAGNOSIS — I6350 Cerebral infarction due to unspecified occlusion or stenosis of unspecified cerebral artery: Secondary | ICD-10-CM | POA: Diagnosis not present

## 2014-04-08 DIAGNOSIS — Z1211 Encounter for screening for malignant neoplasm of colon: Secondary | ICD-10-CM | POA: Diagnosis not present

## 2014-04-08 DIAGNOSIS — R197 Diarrhea, unspecified: Secondary | ICD-10-CM

## 2014-04-08 DIAGNOSIS — Z86010 Personal history of colonic polyps: Secondary | ICD-10-CM

## 2014-04-08 MED ORDER — METRONIDAZOLE 250 MG PO TABS: 250.0000 mg | ORAL_TABLET | Freq: Four times a day (QID) | ORAL | Status: DC

## 2014-04-08 MED ORDER — SACCHAROMYCES BOULARDII 250 MG PO CAPS: 250.0000 mg | ORAL_CAPSULE | Freq: Two times a day (BID) | ORAL | Status: DC

## 2014-04-08 NOTE — Patient Instructions (Signed)
Please go to the basement level to our lab for the stool test.  We sent prescriptions to [facility] for the colonoscopy 2. Flagyl antibiotic 3. Florastor probiotic   You have been scheduled for a colonoscopy. Please follow written instructions given to you at your visit today.  Please pick up your prep kit at the pharmacy within the next 1-3 days. If you use inhalers (even only as needed), please bring them with you on the day of your procedure. Your physician has requested that you go to www.startemmi.com and enter the access code given to you at your visit today. This web site gives a general overview about your procedure. However, you should still follow specific instructions given to you by our office regarding your preparation for the procedure.

## 2014-04-08 NOTE — Progress Notes (Signed)
Agree with initial assessment and plans 

## 2014-04-08 NOTE — Progress Notes (Signed)
Subjective:    Patient ID: [name], female    DOB: 07-08-1935, 79 y.o.   MRN: [id number]  HPI  [name] is a pleasant 79 year old white female known to Dr. Scarlette Shorts from prior colonoscopies. She has history of multiple adenomatous polyps on last colonoscopy done in August 2011 . She was to return for followup colonoscopy at one-year. We have not seen her since.. She comes in today to discuss followup colonoscopy but also because she's been having problems with diarrhea over the past 2 months. Patient has multiple medical issues including history of breast cancer, prior history of DVT and PE for which she's been maintained on Coumadin, she has recently been diagnosed with Alzheimer's and is distressed about that. She has history of a seizure disorder, anxiety, hypertension, and chronic GERD. Patient states that she had been tried on 2 medications for Alzheimer's, one of which was Aricept and this was stopped because of diarrhea. She was tried on another medicine which does not remember the name of and says that also caused worsening diarrhea so it was stopped. She's not been not any meds for the Alzheimer's of the past few months but has now had persistent diarrhea. She says her stools are loose and she's having 4-5 bowel movements per day. These are urgent. She is also had a couple of episodes of nocturnal incontinence of diarrhea. She has not noted any melena or hematochezia. She has no complaints of abdominal pain no fever or chills no nausea or vomiting. She does not recall being on any recent antibiotics. She mentions that she had been diagnosed with ulcerative colitis very remotely and says that has not been active over the past 20 years.   Review of Systems  Constitutional: Negative.   HENT: Negative.   Eyes: Negative.   Respiratory: Negative.   Cardiovascular: Negative.   Gastrointestinal: Positive for diarrhea.  Endocrine: Negative.   Genitourinary: Negative.   Musculoskeletal:  Positive for gait problem.  Skin: Negative.   Allergic/Immunologic: Negative.   Neurological: Positive for seizures.  Hematological: Negative.   Psychiatric/Behavioral: Negative.    Outpatient Prescriptions Prior to Visit  Medication Sig Dispense Refill  . acetaminophen (TYLENOL) 500 MG tablet Take 500 mg by mouth every 6 (six) hours as needed for pain.      Marland Kitchen ALPRAZolam (XANAX) 0.5 MG tablet Take 0.5 mg by mouth 2 (two) times daily as needed for anxiety.       Marland Kitchen aspirin 81 MG tablet Take 81 mg by mouth daily.      . Calcium Citrate-Vitamin D (CITRACAL + D PO) Take 2 tablets by mouth daily.      Marland Kitchen lamoTRIgine (LAMICTAL) 100 MG tablet 1/2 tab in am and one tab every night  150 tablet  3  . lisinopril-hydrochlorothiazide (PRINZIDE,ZESTORETIC) 20-12.5 MG per tablet Take 1 tablet by mouth daily.       . metoprolol tartrate (LOPRESSOR) 25 MG tablet Take 25 mg by mouth 2 (two) times daily.       . Multiple Vitamin (MULTIVITAMIN WITH MINERALS) TABS Take 1 tablet by mouth daily.      . sertraline (ZOLOFT) 100 MG tablet Take 100 mg by mouth daily.       . valsartan-hydrochlorothiazide (DIOVAN-HCT) 160-12.5 MG per tablet Take 12.5 tablets by mouth daily.      Marland Kitchen warfarin (COUMADIN) 5 MG tablet Take 5-7.5 mg by mouth daily. Take one tablet (5 mg) on Mondays, Tuesdays, Wednesday, Fridays and Sauturdays. Take one and one-half  tablets (7.5 mg) on Thursdays and Sundays.      . cefdinir (OMNICEF) 300 MG capsule Take 300 mg by mouth daily.      Marland Kitchen donepezil (ARICEPT) 10 MG tablet Take 1 tablet (10 mg total) by mouth at bedtime.  30 tablet  12  . Memantine HCl ER 28 MG CP24 Take 28 mg by mouth daily.  30 capsule  12  . Memantine HCl ER 7 & 14 & 21 &28 MG CP24 Take as instructed  21 capsule  0  . Multiple Vitamins-Minerals (CENTRUM SILVER PO) Take by mouth daily.      . phenytoin (DILANTIN) 100 MG ER capsule Take 1 capsule (100 mg total) by mouth 2 (two) times daily.  180 capsule  1  . rosuvastatin (CRESTOR) 20  MG tablet Take 10 mg by mouth daily.       No facility-administered medications prior to visit.   Allergies  Allergen Reactions  . Iohexol      Code: RASH, Desc: REMOTE H/O LIP TINGLING, NUMBNESS, AND HIVES WITH IV CM- 13 HR PRE-MED GIVEN AND PATIENT TOLERATED WELL- LY, Onset Date: [id number]   . Sulfonamide Derivatives Swelling   Patient Active Problem List   Diagnosis Date Noted  . Abnormality of gait 04/28/2013  . Neck pain 04/28/2013  . Temporal arteritis 10/01/2012    Class: Chronic  . Depression 09/14/2011    Class: Chronic  . Deep vein thrombosis 09/14/2011    Class: Diagnosis of  . Pulmonary embolism 09/14/2011    Class: Diagnosis of  . Stroke 09/14/2011    Class: Diagnosis of  . Seizures 09/14/2011    Class: Chronic  . Breast cancer 09/14/2011    Class: History of  . Memory loss 09/14/2011    Class: Chronic  . Head trauma 09/14/2011    Class: Acute  . ANEMIA OF CHRONIC DISEASE 02/24/2010  . ANEMIA-UNSPECIFIED 02/24/2010  . DYSPHAGIA 02/24/2010  . FECAL OCCULT BLOOD 02/24/2010  . PERSONAL HX COLONIC POLYPS 02/24/2010  . ADENOCARCINOMA, BREAST 10/03/2007  . ADENOMATOUS COLONIC POLYP 10/03/2007  . HYPERLIPIDEMIA 10/03/2007  . ANXIETY 10/03/2007  . HYPERTENSION 10/03/2007  . ESOPHAGEAL STRICTURE 10/03/2007  . GERD 10/03/2007  . PEPTIC STRICTURE 10/03/2007  . HIATAL HERNIA 10/03/2007  . DIVERTICULOSIS, COLON 10/03/2007  . OSTEOARTHRITIS 10/03/2007   History  Substance Use Topics  . Smoking status: Never Smoker   . Smokeless tobacco: Never Used  . Alcohol Use: No   family history includes Breast cancer in her sister; COPD in her father; Cancer in her sister; Colon polyps in her mother and sister; Heart attack in her father; Heart disease in her mother; Irritable bowel syndrome in her sister.     Objective:   Physical Exam  well-developed elderly white female in no acute distress, pleasant blood pressure 122/62 pulse 56 height 5 foot 1 weight 160. HEENT;  nontraumatic normocephalic EOMI PERRLA sclera anicteric, Supple; no JVD, Cardiovascular; regular rate and rhythm with S1-S2 no murmur or gallop, Pulmonary; clear bilaterally, Abdomen; soft nontender nondistended bowel sounds are active there is no palpable mass or hepatosplenomegaly, Rectal; exam not done, Is extremities no clubbing cyanosis or edema skin warm and dry, Neuro/ psych; mood and affect appropriate        Assessment & Plan:  #10  79 year old female with history of multiple adenomatous polyps on last colonoscopy August 2011 overdue for followup #2 2-3 month history of persistent diarrhea with episodes of incontinence-etiology is not clear, rule out infectious i.e. C. differential,  patient has remote history of ulcerative colitis rule out reactivation, #3 new diagnosis of Alzheimer's dementia #4 chronic anticoagulation with Coumadin #5 history of PE and DVT #6 seizure disorder #7 hypertension #8 GERD #9 history of breast cancer  Plan; check GI pathogen panel today Empiric course of metronidazole 250 mg by mouth 4 times daily x10 days. Will check a pro time in about one week Florastor 1 by mouth twice daily x1 month Schedule for colonoscopy with Dr. Henrene Pastor -procedure discussed in detail with the patient and she is agreeable to proceed. Will obtain consent from her primary care provider Dr. Brigitte Pulse for her to hold Coumadin for 5 days prior to the procedure

## 2014-04-10 ENCOUNTER — Telehealth: Payer: Self-pay | Admitting: Physician Assistant

## 2014-04-10 MED ORDER — PEG-KCL-NACL-NASULF-NA ASC-C 100 G PO SOLR: ORAL | Status: DC

## 2014-04-10 NOTE — Telephone Encounter (Signed)
Called the patient and let her know per Dr. Brigitte Pulse she can hold the coumadin starting on 9-10 through 9-15 and resume it on 04-22-2014.  The patient has an appointment with [place] the NP at Dr. Raul Del office [facility]) on 04-14-2014 for her Lovenox injection. I told the patient to call [facility] and find out what time she is to be there on 9-8.  She thanked me for calling her.

## 2014-04-10 NOTE — Telephone Encounter (Signed)
Spoke with patient's daughter and she wanted to know what an kind of lab INR is. Answered questions. Also, she states Moviprep Rx was not sent for patient. Rx sent to pharmacy.

## 2014-04-14 ENCOUNTER — Telehealth: Payer: Self-pay | Admitting: Internal Medicine

## 2014-04-14 NOTE — Telephone Encounter (Signed)
Spoke with pts daughter and reviewed Flagyl and florastor directions. Also reviewed with her when pt is to stop her coumadin per Dr. Brigitte Pulse. Pt to come for PT/INR tomorrow and they are aware.

## 2014-04-15 ENCOUNTER — Other Ambulatory Visit (INDEPENDENT_AMBULATORY_CARE_PROVIDER_SITE_OTHER): Payer: Medicare Other

## 2014-04-15 DIAGNOSIS — Z86010 Personal history of colonic polyps: Secondary | ICD-10-CM

## 2014-04-15 DIAGNOSIS — D689 Coagulation defect, unspecified: Secondary | ICD-10-CM

## 2014-04-15 DIAGNOSIS — Z1211 Encounter for screening for malignant neoplasm of colon: Secondary | ICD-10-CM | POA: Diagnosis not present

## 2014-04-15 DIAGNOSIS — R197 Diarrhea, unspecified: Secondary | ICD-10-CM

## 2014-04-15 LAB — GASTROINTESTINAL PATHOGEN PANEL PCR
C. DIFFICILE TOX A/B, PCR: NEGATIVE
CRYPTOSPORIDIUM, PCR: NEGATIVE
Campylobacter, PCR: NEGATIVE
E COLI 0157, PCR: NEGATIVE
E coli (ETEC) LT/ST PCR: NEGATIVE
E coli (STEC) stx1/stx2, PCR: NEGATIVE
GIARDIA LAMBLIA, PCR: NEGATIVE
Norovirus, PCR: NEGATIVE
ROTAVIRUS, PCR: NEGATIVE
SALMONELLA, PCR: NEGATIVE
Shigella, PCR: NEGATIVE

## 2014-04-15 LAB — PROTIME-INR
INR: 1.4 ratio — ABNORMAL HIGH (ref 0.8–1.0)
PROTHROMBIN TIME: 15.3 s — AB (ref 9.6–13.1)

## 2014-04-21 ENCOUNTER — Ambulatory Visit (AMBULATORY_SURGERY_CENTER): Payer: Medicare Other | Admitting: Internal Medicine

## 2014-04-21 ENCOUNTER — Encounter: Payer: Self-pay | Admitting: Internal Medicine

## 2014-04-21 VITALS — BP 138/53 | HR 50 | Temp 97.6°F | Resp 17 | Ht 61.0 in | Wt 160.0 lb

## 2014-04-21 DIAGNOSIS — R197 Diarrhea, unspecified: Secondary | ICD-10-CM

## 2014-04-21 DIAGNOSIS — Z86010 Personal history of colonic polyps: Secondary | ICD-10-CM | POA: Diagnosis not present

## 2014-04-21 DIAGNOSIS — I10 Essential (primary) hypertension: Secondary | ICD-10-CM | POA: Diagnosis not present

## 2014-04-21 DIAGNOSIS — G309 Alzheimer's disease, unspecified: Secondary | ICD-10-CM | POA: Diagnosis not present

## 2014-04-21 DIAGNOSIS — G40909 Epilepsy, unspecified, not intractable, without status epilepticus: Secondary | ICD-10-CM | POA: Diagnosis not present

## 2014-04-21 DIAGNOSIS — F341 Dysthymic disorder: Secondary | ICD-10-CM | POA: Diagnosis not present

## 2014-04-21 DIAGNOSIS — Z8673 Personal history of transient ischemic attack (TIA), and cerebral infarction without residual deficits: Secondary | ICD-10-CM | POA: Diagnosis not present

## 2014-04-21 DIAGNOSIS — I2510 Atherosclerotic heart disease of native coronary artery without angina pectoris: Secondary | ICD-10-CM | POA: Diagnosis not present

## 2014-04-21 DIAGNOSIS — D126 Benign neoplasm of colon, unspecified: Secondary | ICD-10-CM

## 2014-04-21 DIAGNOSIS — D125 Benign neoplasm of sigmoid colon: Secondary | ICD-10-CM

## 2014-04-21 DIAGNOSIS — F0280 Dementia in other diseases classified elsewhere without behavioral disturbance: Secondary | ICD-10-CM | POA: Diagnosis not present

## 2014-04-21 DIAGNOSIS — D122 Benign neoplasm of ascending colon: Secondary | ICD-10-CM

## 2014-04-21 MED ORDER — SODIUM CHLORIDE 0.9 % IV SOLN: 500.0000 mL | INTRAVENOUS | Status: DC

## 2014-04-21 NOTE — Progress Notes (Signed)
Report to PACU, RN, vss, BBS= Clear.  

## 2014-04-21 NOTE — Progress Notes (Signed)
Called to room to assist during endoscopic procedure.  Patient ID and intended procedure confirmed with present staff. Received instructions for my participation in the procedure from the performing physician.  

## 2014-04-21 NOTE — Op Note (Signed)
[name] [place], [id number]   COLONOSCOPY PROCEDURE REPORT  PATIENT: [name], [name]  MR#: [id number] BIRTHDATE: [birth year] , 30  yrs. old GENDER: Female ENDOSCOPIST: Eustace Quail, MD REFERRED BY:W.  Lutricia Feil, M.D. PROCEDURE DATE:  04/21/2014 PROCEDURE:   Colonoscopy with snare polypectomy x2;and Colonoscopy with biopsies First Screening Colonoscopy - Avg.  risk and is 50 yrs.  old or older - No.  Prior Negative Screening - Now for repeat screening. N/A  History of Adenoma - Now for follow-up colonoscopy & has been > or = to 3 yrs.  Yes hx of adenoma.  Has been 3 or more years since last colonoscopy.  Polyps Removed Today? Yes. ASA CLASS:   Class III INDICATIONS:Patient's personal history of adenomatous colon polyps(multiple polyps and large piecemeal 2011. Overdue for followup); unexplained diarrhea. MEDICATIONS: MAC sedation, administered by CRNA and propofol (Diprivan) 270mg  IV DESCRIPTION OF PROCEDURE:   After the risks benefits and alternatives of the procedure were thoroughly explained, informed consent was obtained.  A digital rectal exam revealed no abnormalities of the rectum.   The [id number]  endoscope was introduced through the anus and advanced to the cecum, which was identified by both the appendix and ileocecal valve. No adverse events experienced.   The quality of the prep was excellent, using MoviPrep  The instrument was then slowly withdrawn as the colon was fully examined.  COLON FINDINGS: The colonoscope was advanced to the cecal tip.  In the ascending colon was a 7 mm sessile polyp which was removed with cold snare.  Previously placed marking tattoo in the sigmoid colon was identified.  On the opposite wall was residual adenomatous-appearing tissue.  This was removed with cold snare, piecemeal.  Tissue retrieved and submitted for pathologic analysis. Moderate left-sided diverticulosis.  Multiple random  colon biopsies were taken to evaluate diarrhea.  Retroflexed views revealed internal hemorrhoids.The time to cecum=4 min 29 sec.Withdrawal time=21 min 14 sec.The scope was withdrawn and the procedure completed. COMPLICATIONS: There were no complications.  ENDOSCOPIC IMPRESSION: 1. Piecemeal polypectomy of descending colon polyp and sigmoid colon polyp (residual). Cold snare 2. Moderate left-sided diverticulosis.  RECOMMENDATIONS: 1.  Await pathology results 2.  Repeat Colonoscopy interval to be determined based on pathology. 3.  Resume Coumadin (warfarin) today and have your PT/INR checked within 1 week. 4.  Call to schedule a follow-up appointment with office 4-6 week(s)   eSigned:  Eustace Quail, MD 04/21/2014 9:45 AM  cc: Janalyn Rouse, MD and The Patient   PATIENT NAME:  [name], [name] MR#: [id number]

## 2014-04-21 NOTE — Patient Instructions (Signed)
YOU HAD AN ENDOSCOPIC PROCEDURE TODAY AT THE [facility]: Refer to the procedure report that was given to you for any specific questions about what was found during the examination.  If the procedure report does not answer your questions, please call your gastroenterologist to clarify.  If you requested that your care partner not be given the details of your procedure findings, then the procedure report has been included in a sealed envelope for you to review at your convenience later.  YOU SHOULD EXPECT: Some feelings of bloating in the abdomen. Passage of more gas than usual.  Walking can help get rid of the air that was put into your GI tract during the procedure and reduce the bloating. If you had a lower endoscopy (such as a colonoscopy or flexible sigmoidoscopy) you may notice spotting of blood in your stool or on the toilet paper. If you underwent a bowel prep for your procedure, then you may not have a normal bowel movement for a few days.  DIET: Your first meal following the procedure should be a light meal and then it is ok to progress to your normal diet.  A half-sandwich or bowl of soup is an example of a good first meal.  Heavy or fried foods are harder to digest and may make you feel nauseous or bloated.  Likewise meals heavy in dairy and vegetables can cause extra gas to form and this can also increase the bloating.  Drink plenty of fluids but you should avoid alcoholic beverages for 24 hours.  ACTIVITY: Your care partner should take you home directly after the procedure.  You should plan to take it easy, moving slowly for the rest of the day.  You can resume normal activity the day after the procedure however you should NOT DRIVE or use heavy machinery for 24 hours (because of the sedation medicines used during the test).    SYMPTOMS TO REPORT IMMEDIATELY: A gastroenterologist can be reached at any hour.  During normal business hours, 8:30 AM to 5:00 PM Monday through Friday,  call [phone].  After hours and on weekends, please call the GI answering service at [phone] who will take a message and have the physician on call contact you.   Following lower endoscopy (colonoscopy or flexible sigmoidoscopy):  Excessive amounts of blood in the stool  Significant tenderness or worsening of abdominal pains  Swelling of the abdomen that is new, acute  Fever of 100F or higher    FOLLOW UP: If any biopsies were taken you will be contacted by phone or by letter within the next 1-3 weeks.  Call your gastroenterologist if you have not heard about the biopsies in 3 weeks.  Our staff will call the home number listed on your records the next business day following your procedure to check on you and address any questions or concerns that you may have at that time regarding the information given to you following your procedure. This is a courtesy call and so if there is no answer at the home number and we have not heard from you through the emergency physician on call, we will assume that you have returned to your regular daily activities without incident.  SIGNATURES/CONFIDENTIALITY: You and/or your care partner have signed paperwork which will be entered into your electronic medical record.  These signatures attest to the fact that that the information above on your After Visit Summary has been reviewed and is understood.  Full responsibility of the confidentiality   of this discharge information lies with you and/or your care-partner.  Polyp,  Diverticulosis, and high fiber diet information given.   Resume coumadin today and repeat PT and INR within next week.  Call office to schedule office visit in 4-6 weeks.

## 2014-04-22 ENCOUNTER — Telehealth: Payer: Self-pay | Admitting: *Deleted

## 2014-04-22 NOTE — Telephone Encounter (Signed)
  Follow up Call-  Call back number 04/21/2014  Post procedure Call Back phone  # [phone]  Permission to leave phone message Yes     Patient questions:  Do you have a fever, pain , or abdominal swelling? No. Pain Score  0 *  Have you tolerated food without any problems? Yes.    Have you been able to return to your normal activities? Yes.    Do you have any questions about your discharge instructions: Diet   No. Medications  No. Follow up visit  No.  Do you have questions or concerns about your Care? No.  Actions: * If pain score is 4 or above: No action needed, pain <4.  Information provided via spouse, he stated she did ok.

## 2014-04-23 ENCOUNTER — Telehealth: Payer: Self-pay | Admitting: Internal Medicine

## 2014-04-23 NOTE — Telephone Encounter (Signed)
Pt could not remember what was on her colon report. Report reviewed with pt and her daughter.

## 2014-04-24 DIAGNOSIS — Z7901 Long term (current) use of anticoagulants: Secondary | ICD-10-CM | POA: Diagnosis not present

## 2014-04-24 DIAGNOSIS — Z23 Encounter for immunization: Secondary | ICD-10-CM | POA: Diagnosis not present

## 2014-04-24 DIAGNOSIS — I82409 Acute embolism and thrombosis of unspecified deep veins of unspecified lower extremity: Secondary | ICD-10-CM | POA: Diagnosis not present

## 2014-04-28 ENCOUNTER — Ambulatory Visit: Payer: Medicare Other | Admitting: Neurology

## 2014-04-29 DIAGNOSIS — M25819 Other specified joint disorders, unspecified shoulder: Secondary | ICD-10-CM | POA: Diagnosis not present

## 2014-04-29 DIAGNOSIS — M25519 Pain in unspecified shoulder: Secondary | ICD-10-CM | POA: Diagnosis not present

## 2014-04-29 DIAGNOSIS — M25579 Pain in unspecified ankle and joints of unspecified foot: Secondary | ICD-10-CM | POA: Diagnosis not present

## 2014-05-02 ENCOUNTER — Encounter: Payer: Self-pay | Admitting: Internal Medicine

## 2014-05-26 DIAGNOSIS — I8290 Acute embolism and thrombosis of unspecified vein: Secondary | ICD-10-CM | POA: Diagnosis not present

## 2014-05-26 DIAGNOSIS — M79605 Pain in left leg: Secondary | ICD-10-CM | POA: Diagnosis not present

## 2014-05-26 DIAGNOSIS — Z6830 Body mass index (BMI) 30.0-30.9, adult: Secondary | ICD-10-CM | POA: Diagnosis not present

## 2014-05-26 DIAGNOSIS — Z7901 Long term (current) use of anticoagulants: Secondary | ICD-10-CM | POA: Diagnosis not present

## 2014-05-28 DIAGNOSIS — G40909 Epilepsy, unspecified, not intractable, without status epilepticus: Secondary | ICD-10-CM | POA: Diagnosis not present

## 2014-05-28 DIAGNOSIS — R51 Headache: Secondary | ICD-10-CM | POA: Diagnosis not present

## 2014-05-28 DIAGNOSIS — G309 Alzheimer's disease, unspecified: Secondary | ICD-10-CM | POA: Diagnosis not present

## 2014-05-28 DIAGNOSIS — F419 Anxiety disorder, unspecified: Secondary | ICD-10-CM | POA: Diagnosis not present

## 2014-06-09 ENCOUNTER — Ambulatory Visit: Payer: Medicare Other | Admitting: Internal Medicine

## 2014-06-10 DIAGNOSIS — G309 Alzheimer's disease, unspecified: Secondary | ICD-10-CM | POA: Diagnosis not present

## 2014-06-10 DIAGNOSIS — I8290 Acute embolism and thrombosis of unspecified vein: Secondary | ICD-10-CM | POA: Diagnosis not present

## 2014-06-10 DIAGNOSIS — Z7901 Long term (current) use of anticoagulants: Secondary | ICD-10-CM | POA: Diagnosis not present

## 2014-06-10 DIAGNOSIS — Z6830 Body mass index (BMI) 30.0-30.9, adult: Secondary | ICD-10-CM | POA: Diagnosis not present

## 2014-06-15 DIAGNOSIS — G40301 Generalized idiopathic epilepsy and epileptic syndromes, not intractable, with status epilepticus: Secondary | ICD-10-CM | POA: Diagnosis not present

## 2014-06-15 DIAGNOSIS — I10 Essential (primary) hypertension: Secondary | ICD-10-CM | POA: Diagnosis not present

## 2014-06-15 DIAGNOSIS — Z86718 Personal history of other venous thrombosis and embolism: Secondary | ICD-10-CM | POA: Diagnosis not present

## 2014-06-15 DIAGNOSIS — E785 Hyperlipidemia, unspecified: Secondary | ICD-10-CM | POA: Diagnosis not present

## 2014-06-15 DIAGNOSIS — Z7901 Long term (current) use of anticoagulants: Secondary | ICD-10-CM | POA: Diagnosis not present

## 2014-06-15 DIAGNOSIS — I2510 Atherosclerotic heart disease of native coronary artery without angina pectoris: Secondary | ICD-10-CM | POA: Diagnosis not present

## 2014-06-15 DIAGNOSIS — E668 Other obesity: Secondary | ICD-10-CM | POA: Diagnosis not present

## 2014-06-15 DIAGNOSIS — Z853 Personal history of malignant neoplasm of breast: Secondary | ICD-10-CM | POA: Diagnosis not present

## 2014-08-18 DIAGNOSIS — Z7901 Long term (current) use of anticoagulants: Secondary | ICD-10-CM | POA: Diagnosis not present

## 2014-08-18 DIAGNOSIS — I10 Essential (primary) hypertension: Secondary | ICD-10-CM | POA: Diagnosis not present

## 2014-08-18 DIAGNOSIS — I2510 Atherosclerotic heart disease of native coronary artery without angina pectoris: Secondary | ICD-10-CM | POA: Diagnosis not present

## 2014-08-18 DIAGNOSIS — G309 Alzheimer's disease, unspecified: Secondary | ICD-10-CM | POA: Diagnosis not present

## 2014-08-18 DIAGNOSIS — R7301 Impaired fasting glucose: Secondary | ICD-10-CM | POA: Diagnosis not present

## 2014-08-18 DIAGNOSIS — W19XXXA Unspecified fall, initial encounter: Secondary | ICD-10-CM | POA: Diagnosis not present

## 2014-08-18 DIAGNOSIS — I8290 Acute embolism and thrombosis of unspecified vein: Secondary | ICD-10-CM | POA: Diagnosis not present

## 2014-08-18 DIAGNOSIS — E785 Hyperlipidemia, unspecified: Secondary | ICD-10-CM | POA: Diagnosis not present

## 2014-08-19 DIAGNOSIS — G309 Alzheimer's disease, unspecified: Secondary | ICD-10-CM | POA: Diagnosis not present

## 2014-09-09 ENCOUNTER — Ambulatory Visit: Payer: Medicare Other | Attending: Internal Medicine | Admitting: Rehabilitative and Restorative Service Providers"

## 2014-09-09 DIAGNOSIS — Z9181 History of falling: Secondary | ICD-10-CM | POA: Insufficient documentation

## 2014-09-09 DIAGNOSIS — M810 Age-related osteoporosis without current pathological fracture: Secondary | ICD-10-CM | POA: Insufficient documentation

## 2014-09-09 DIAGNOSIS — R269 Unspecified abnormalities of gait and mobility: Secondary | ICD-10-CM | POA: Diagnosis not present

## 2014-09-09 NOTE — Therapy (Signed)
[facility] [address] [place], [place], [address] Phone: [phone]   Fax:  [phone]  Physical Therapy Evaluation  Patient Details  Name: [name] MRN: [id number] Date of Birth: [birth year] Referring Provider:  Marton Redwood, MD  Encounter Date: 09/09/2014      PT End of Session - 09/09/14 1424    Visit Number 1  G code 1   Number of Visits 12   PT Start Time 1150   PT Stop Time 1232   PT Time Calculation (min) 42 min   Equipment Utilized During Treatment Gait belt   Activity Tolerance Patient tolerated treatment well      Past Medical History  Diagnosis Date  . Osteoporosis, senile   . Elevated cholesterol   . Cancer of breast, female     35  . History of blood clots     sees Dr. Wynonia Lawman  . Complication of anesthesia     "strong pain meds etc, causes dizziness"  . Hypertension     sees Dr. Marton Redwood  . Depression   . Anxiety   . Pneumonia     "bilaterally pneumonia, hospitalized 15 years ago"  . Stroke     hx of blood clots  . Urinary tract infection     hx of  . H/O hiatal hernia     "had surgery past hx"  . Headache(784.0)     "occas"  . Arthritis   . Anemia     hx of  . Irritable bowel syndrome     hx of  . Peptic ulcer disease     hx of  . Coronary artery disease     sees Dr. Wynonia Lawman  . Epilepsy     sees Dr. Erling Cruz  . Seizures   . Colon polyps 04/05/2010    Tubular adenomatous polyps  . GERD (gastroesophageal reflux disease) 04/05/2010  . Dizziness and giddiness   . Alzheimer disease   . Diverticulosis   . Esophageal stricture     Past Surgical History  Procedure Laterality Date  . Cholecystectomy, laparoscopic    . Rotator cuff repair Left   . Hernia repair    . Cholecystectomy    . Eye surgery      bilateral cataract surgery  . Surgery to uterus      "patient unsure of term, states not fibroids"  . Breast surgery Left     lumpectomy  . Dilation and  curettage of uterus    . Cardiac catheterization    . Artery biopsy Left 10/01/2012    Procedure: LEFT TEMPORAL ARTERY BIOPSY WITH ULTRASOUND PROBE;  Surgeon: Jerrell Belfast, MD;  Location: [facility];  Service: ENT;  Laterality: Left;  . Breast lumpectomy Right     x 2  . Nissen fundoplication    . Colonoscopy      There were no vitals taken for this visit.  Visit Diagnosis:  Abnormality of gait - Plan: PT plan of care cert/re-cert      Subjective Assessment - 09/09/14 1158    Symptoms The patient reports that she has frequent falls "when I'n not paying attention to what I'm doing".  She reports a general sensation of numbness across her face and on the side of her head since the accident where she fell and hit her head.  She reports continued dizziness and neck pain.     Pertinent History seizures, h/o unsteadiness with fallls with injury, h/o head trauma, memory issues  Patient Stated Goals Improve with balance, "I'd like to find out why my head still hurts and the whole thing gets numb"   Currently in Pain? Yes   Pain Score --  "a little bit"   Pain Location Neck   Pain Orientation Mid   Pain Descriptors / Indicators Aching;Headache;Pressure   Pain Onset More than a month ago   Pain Frequency Constant   Aggravating Factors  worse when waking up in the morning   Pain Relieving Factors Unsure          [facility] PT Assessment - 09/09/14 0001    Assessment   Medical Diagnosis accidental fall   Onset Date --  frequent falls, falls with head trauma in 2014   Prior Therapy --  PT for balance   Precautions   Precautions Fall   Balance Screen   Has the patient fallen in the past 6 months Yes   How many times? 10   Has the patient had a decrease in activity level because of a fear of falling?  Yes   Is the patient reluctant to leave their home because of a fear of falling?  Yes   [place] Private residence  family assistance from husband and daughter    Type of [place] to enter   Entrance Stairs-Number of Steps --  5 at front, one step where she enters home   Entrance Stairs-Rails Right   [place] One level   [place] --  pt reports she is supposed to use a cane, but she doesn't   Cognition   Overall Cognitive Status History of cognitive impairments - at baseline   Observation/Other Assessments   Other Surveys  --  not performed due to cognitive deficits   AROM   Overall AROM  Within functional limits for tasks performed   Strength   Overall Strength Comments 4/5 R shoulder flexion/abduction and elbow flexion, 3/5 L shoulder flexion/abduction, 4/5 L elbow flexion.  4/5 bilat hip flexion, 5/5 bilat knee flexion/extension, 4/5 bilat ankle dorsiflexion   Ambulation/Gait   Ambulation/Gait Yes   Ambulation Distance (Feet) --  200   Gait Pattern --  mild shuffling noted, decreased bilat heel strike   Ambulation Surface Level   Gait velocity 2.72 ft/sec indicating "full community ambulator" classification of gait   Stairs Yes   Stairs Assistance 6: Modified independent (Device/Increase time)   Stair Management Technique One rail Right;Step to pattern   Number of Stairs --  4   Standardized Balance Assessment   Standardized Balance Assessment Berg Balance Test;Timed Up and Go Test   Berg Balance Test   Sit to Stand Able to stand without using hands and stabilize independently   Standing Unsupported Able to stand safely 2 minutes   Sitting with Back Unsupported but Feet Supported on Floor or Stool Able to sit safely and securely 2 minutes   Stand to Sit Sits safely with minimal use of hands   Transfers Able to transfer safely, minor use of hands   Standing Unsupported with Eyes Closed Able to stand 10 seconds with supervision   Standing Ubsupported with Feet Together Able to place feet together independently and stand 1 minute safely   From Standing, Reach Forward with Outstretched Arm Can reach  forward >5 cm safely (2")   From Standing Position, Pick up Object from Floor Able to pick up shoe, needs supervision   From Standing Position, Turn to Look  Behind Over each Shoulder Turn sideways only but maintains balance   Turn 360 Degrees Able to turn 360 degrees safely but slowly  Pt requires 6 seconds to turn 360 degrees   Standing Unsupported, Alternately Place Feet on Step/Stool Able to complete >2 steps/needs minimal assist   Standing Unsupported, One Foot in Front Able to take small step independently and hold 30 seconds   Standing on One Leg Unable to try or needs assist to prevent fall   Total Score 39/56   Timed Up and Go Test   TUG --  17.21 seconds without a device with supervision            Vestibular Assessment - 09/09/14 1228    Type of Dizziness --  "feels like too much water in my head"   Frequency of Dizziness --  daily   Duration of Dizziness --  hard to describe   Aggravating Factors Mornings;Turning head quickly;Turning body quickly   Spontaneous Absent   Gaze-induced Absent   VOR 1 Head Only (x 1 viewing) --  unable to maintain gaze with slow head movement, nauseous   Sidelying Test Sidelying Right;Sidelying Left   Horizontal Canal Testing Horizontal Canal Right;Horizontal Canal Left   Sidelying Right Symptoms No nystagmus   Sidelying Left Symptoms No nystagmus   Horizontal Canal Right Symptoms Normal  nausea with rolling and return to sitting   Horizontal Canal Left Symptoms Normal  nausea with rolling and return to sitting           PT Short Term Goals - 09/09/14 1429    PT SHORT TERM GOAL #1   Title The patient will perform HEP with family assistance.  Target date 10/08/2014   Time 4   Period Weeks   PT SHORT TERM GOAL #2   Title The patient will improve Berg score from 39/56 up to 45/56 to demo decreasing risk for falls.  Target date 10/08/2014.   Time 4   Period Weeks   PT SHORT TERM GOAL #3   Title The patient will decrease TUG score  from 17.21 seconds down to 14 seconds to demo decreasing risk for falls.  Target date 10/08/2014.   Time 4   Period Weeks   PT SHORT TERM GOAL #4   Title The patient will improve gait speed from 2.76 ft/sec up to 3.0 ft/sec to demo improving functional mobility.  Target date 3/3/20164   Time 4   Period Weeks           PT Long Term Goals - 09/09/14 1431    PT LONG TERM GOAL #1   Title The patient will be able to perform post d/c HEP for continued mobility training.  Target date 10/22/2014   Time 6   Period Weeks   PT LONG TERM GOAL #2   Title The patient will improve Berg from 39/56 up to 48/56 for decreased risk for falls.  Target date 10/22/2014.   Time 6   Period Weeks   PT LONG TERM GOAL #3   Title The patient will improve walking speed from 2.76 ft/sec up to 3.3 ft/sec to demo improving functional moblity. Target date 10/22/2014   Time 6   Period Weeks   PT LONG TERM GOAL #4   Title The patient will tolerate bed mobility and return to sit without subjective reports of nausea.  Target date 10/22/2014   Time 6   Period [place]  Plan - 09/11/14 1425    Clinical Impression Statement The patient is a 80 yo female presenting to outpatient physical therapy with multi-factorial fall risk including prior h/o falls (reports 10+/6 months), injury from falls, decline in memory, mild decrease in LE strength, and polypharmacy.  PT to focus on LE strengthening, balance training, mobility training .  The patient also presents with subjective reports of decreased focus, HA, and neck pain.   These symptoms are aggravated with head movements and may be a post-concussive type presentation from h/o fall.   PT to treat as tolerated to improve/optimize current functional status.   Pt will benefit from skilled therapeutic intervention in order to improve on the following deficits Decreased cognition;Decreased balance;Abnormal gait;Difficulty walking;Postural dysfunction;Pain;Decreased  mobility;Decreased strength   Rehab Potential Good   PT Frequency 2x / week   PT Duration 6 weeks   PT Treatment/Interventions Functional mobility training;Neuromuscular re-education;Stair training;Gait training;Balance training;Manual techniques;Patient/family education;Therapeutic activities;Therapeutic exercise;Other (comment)  vestibular rehab, as indicated   PT Next Visit Plan HEP: LE strengthening, habituation, balance and mobility activities.  *Teach family for carryover to home.   Consulted and Agree with Plan of Care Patient          G-Codes - 2014-09-11 1433    Functional Assessment Tool Used Berg=39/56, TUG=17.21 seconds, gait speed=2.76 ft/sec   Functional Limitation Mobility: Walking and moving around   Mobility: Walking and Moving Around Current Status [phone]) At least 20 percent but less than 40 percent impaired, limited or restricted   Mobility: Walking and Moving Around Goal Status [phone]) At least 1 percent but less than 20 percent impaired, limited or restricted       Problem List Patient Active Problem List   Diagnosis Date Noted  . Abnormality of gait 04/28/2013  . Neck pain 04/28/2013  . Temporal arteritis 10/01/2012    Class: Chronic  . Depression 09/14/2011    Class: Chronic  . Deep vein thrombosis 09/14/2011    Class: Diagnosis of  . Pulmonary embolism 09/14/2011    Class: Diagnosis of  . Stroke 09/14/2011    Class: Diagnosis of  . Seizures 09/14/2011    Class: Chronic  . Breast cancer 09/14/2011    Class: History of  . Memory loss 09/14/2011    Class: Chronic  . Head trauma 09/14/2011    Class: Acute  . ANEMIA OF CHRONIC DISEASE 02/24/2010  . ANEMIA-UNSPECIFIED 02/24/2010  . DYSPHAGIA 02/24/2010  . FECAL OCCULT BLOOD 02/24/2010  . PERSONAL HX COLONIC POLYPS 02/24/2010  . ADENOCARCINOMA, BREAST 10/03/2007  . ADENOMATOUS COLONIC POLYP 10/03/2007  . HYPERLIPIDEMIA 10/03/2007  . ANXIETY 10/03/2007  . HYPERTENSION 10/03/2007  . ESOPHAGEAL  STRICTURE 10/03/2007  . GERD 10/03/2007  . PEPTIC STRICTURE 10/03/2007  . HIATAL HERNIA 10/03/2007  . DIVERTICULOSIS, COLON 10/03/2007  . OSTEOARTHRITIS 10/03/2007    Thank you for the referral of this patient.  [place], [place] September 11, 2014, 2:37 PM  [facility] [address] [place], [place], [address] Phone: [phone]   Fax:  [phone]

## 2014-09-18 ENCOUNTER — Ambulatory Visit: Payer: Medicare Other | Admitting: Rehabilitative and Restorative Service Providers"

## 2014-09-18 DIAGNOSIS — Z9181 History of falling: Secondary | ICD-10-CM | POA: Diagnosis not present

## 2014-09-18 DIAGNOSIS — R269 Unspecified abnormalities of gait and mobility: Secondary | ICD-10-CM | POA: Diagnosis not present

## 2014-09-18 DIAGNOSIS — M810 Age-related osteoporosis without current pathological fracture: Secondary | ICD-10-CM | POA: Diagnosis not present

## 2014-09-18 NOTE — Patient Instructions (Addendum)
Functional Quadriceps: Sit to Stand   Sit on edge of chair, feet flat on floor. Stand upright, extending knees fully. Repeat _10___ times per set. Do __1__ sets per session. Do __2__ sessions per day.  http://orth.exer.us/735   Copyright  VHI. All rights reserved.  ANKLE: Dorsiflexion - Standing   Stand with upright posture. Raise toes of both feet up at same time. _20__ reps per set, _2__ sets per day.  Hold onto a support.  Copyright  VHI. All rights reserved.  ANKLE: Plantarflexion, Bilateral - Standing   Stand with upright posture. Raise heels up as high as possible. _20__ reps per set, _2__ sets per day. Hold onto a support.  Copyright  VHI. All rights reserved.

## 2014-09-18 NOTE — Therapy (Signed)
[facility] [address] [place], [place], [address] Phone: [phone]   Fax:  [phone]  Physical Therapy Treatment  Patient Details  Name: [name] MRN: [id number] Date of Birth: 24-May-1935 Referring Provider:  Marton Redwood, MD  Encounter Date: 09/18/2014      PT End of Session - 09/18/14 1624    Visit Number 2  G code 2   Number of Visits 12   PT Start Time 1336   PT Stop Time 1420   PT Time Calculation (min) 44 min   Equipment Utilized During Treatment Gait belt   Activity Tolerance Patient tolerated treatment well   Behavior During Therapy [facility] for tasks assessed/performed      Past Medical History  Diagnosis Date  . Osteoporosis, senile   . Elevated cholesterol   . Cancer of breast, female     68  . History of blood clots     sees Dr. Wynonia Lawman  . Complication of anesthesia     "strong pain meds etc, causes dizziness"  . Hypertension     sees Dr. Marton Redwood  . Depression   . Anxiety   . Pneumonia     "bilaterally pneumonia, hospitalized 15 years ago"  . Stroke     hx of blood clots  . Urinary tract infection     hx of  . H/O hiatal hernia     "had surgery past hx"  . Headache(784.0)     "occas"  . Arthritis   . Anemia     hx of  . Irritable bowel syndrome     hx of  . Peptic ulcer disease     hx of  . Coronary artery disease     sees Dr. Wynonia Lawman  . Epilepsy     sees Dr. Erling Cruz  . Seizures   . Colon polyps 04/05/2010    Tubular adenomatous polyps  . GERD (gastroesophageal reflux disease) 04/05/2010  . Dizziness and giddiness   . Alzheimer disease   . Diverticulosis   . Esophageal stricture     Past Surgical History  Procedure Laterality Date  . Cholecystectomy, laparoscopic    . Rotator cuff repair Left   . Hernia repair    . Cholecystectomy    . Eye surgery      bilateral cataract surgery  . Surgery to uterus      "patient unsure of term, states not fibroids"   . Breast surgery Left     lumpectomy  . Dilation and curettage of uterus    . Cardiac catheterization    . Artery biopsy Left 10/01/2012    Procedure: LEFT TEMPORAL ARTERY BIOPSY WITH ULTRASOUND PROBE;  Surgeon: Jerrell Belfast, MD;  Location: [facility];  Service: ENT;  Laterality: Left;  . Breast lumpectomy Right     x 2  . Nissen fundoplication    . Colonoscopy      There were no vitals taken for this visit.  Visit Diagnosis:  Abnormality of gait      Subjective Assessment - 09/18/14 1536    Symptoms The patient reports that she had a nose bleed this morning that lasted for an hour.  The patient reports that when she is watching what she is doing, she feels like she can balance, but when she is not paying attention, she falls.    Currently in Pain? No/denies     THERAPEUTIC EXERCISE: Heel and toe raises at countertop x 20 reps with UE support  Sit<>stand x 10 reps x 2 sets with cues to keep eyes open Mini squats x 10 reps  Gait: Gait with emphasis on longer stride length and "power" x 450 ft and backwards walking with CGA.    NEUROMUSCULAR RE-EDUCATION: Habituation x 3 reps for sit<>sidelying with dizziness  *Pt required rest break after each repetition to allow dizziness and nausea to settle.                      PT Education - 09/18/14 1621    Education provided Yes   Education Details HEP: heel raises, toe raises, sit<>stand   Person(s) Educated Patient   Methods Explanation;Handout;Demonstration   Comprehension Returned demonstration;Verbalized understanding          PT Short Term Goals - 09/09/14 1429    PT SHORT TERM GOAL #1   Title The patient will perform HEP with family assistance.  Target date 10/08/2014   Time 4   Period Weeks   PT SHORT TERM GOAL #2   Title The patient will improve Berg score from 39/56 up to 45/56 to demo decreasing risk for falls.  Target date 10/08/2014.   Time 4   Period Weeks   PT SHORT TERM GOAL #3   Title The  patient will decrease TUG score from 17.21 seconds down to 14 seconds to demo decreasing risk for falls.  Target date 10/08/2014.   Time 4   Period Weeks   PT SHORT TERM GOAL #4   Title The patient will improve gait speed from 2.76 ft/sec up to 3.0 ft/sec to demo improving functional mobility.  Target date 3/3/20164   Time 4   Period Weeks           PT Long Term Goals - 09/09/14 1431    PT LONG TERM GOAL #1   Title The patient will be able to perform post d/c HEP for continued mobility training.  Target date 10/22/2014   Time 6   Period Weeks   PT LONG TERM GOAL #2   Title The patient will improve Berg from 39/56 up to 48/56 for decreased risk for falls.  Target date 10/22/2014.   Time 6   Period Weeks   PT LONG TERM GOAL #3   Title The patient will improve walking speed from 2.76 ft/sec up to 3.3 ft/sec to demo improving functional moblity. Target date 10/22/2014   Time 6   Period Weeks   PT LONG TERM GOAL #4   Title The patient will tolerate bed mobility and return to sit without subjective reports of nausea.  Target date 10/22/2014   Time 6   Period Weeks               Plan - 09/18/14 1624    Clinical Impression Statement The patient tolerated LE strengthening activities well.  Habituation activities provoke nausea and sensation of room movement, however nystagmus not viewed in room light.  PT to continue developing HEP to patient tolerance.  I reviewed HEP with family due to patient's cognitive limitations.   PT Next Visit Plan Habituation, standing dynamic activities with min A, stepping over objects, etc.   Consulted and Agree with Plan of Care Patient        Problem List Patient Active Problem List   Diagnosis Date Noted  . Abnormality of gait 04/28/2013  . Neck pain 04/28/2013  . Temporal arteritis 10/01/2012    Class: Chronic  . Depression 09/14/2011    Class: Chronic  .  Deep vein thrombosis 09/14/2011    Class: Diagnosis of  . Pulmonary embolism  09/14/2011    Class: Diagnosis of  . Stroke 09/14/2011    Class: Diagnosis of  . Seizures 09/14/2011    Class: Chronic  . Breast cancer 09/14/2011    Class: History of  . Memory loss 09/14/2011    Class: Chronic  . Head trauma 09/14/2011    Class: Acute  . ANEMIA OF CHRONIC DISEASE 02/24/2010  . ANEMIA-UNSPECIFIED 02/24/2010  . DYSPHAGIA 02/24/2010  . FECAL OCCULT BLOOD 02/24/2010  . PERSONAL HX COLONIC POLYPS 02/24/2010  . ADENOCARCINOMA, BREAST 10/03/2007  . ADENOMATOUS COLONIC POLYP 10/03/2007  . HYPERLIPIDEMIA 10/03/2007  . ANXIETY 10/03/2007  . HYPERTENSION 10/03/2007  . ESOPHAGEAL STRICTURE 10/03/2007  . GERD 10/03/2007  . PEPTIC STRICTURE 10/03/2007  . HIATAL HERNIA 10/03/2007  . DIVERTICULOSIS, COLON 10/03/2007  . OSTEOARTHRITIS 10/03/2007    Elvy Mclarty, PT 09/18/2014, 4:28 PM  [facility] [address] [place], [place], [address] Phone: [phone]   Fax:  [phone]

## 2014-09-22 ENCOUNTER — Ambulatory Visit: Payer: Medicare Other | Admitting: Rehabilitative and Restorative Service Providers"

## 2014-09-22 DIAGNOSIS — M810 Age-related osteoporosis without current pathological fracture: Secondary | ICD-10-CM | POA: Diagnosis not present

## 2014-09-22 DIAGNOSIS — Z9181 History of falling: Secondary | ICD-10-CM | POA: Diagnosis not present

## 2014-09-22 DIAGNOSIS — R269 Unspecified abnormalities of gait and mobility: Secondary | ICD-10-CM

## 2014-09-22 NOTE — Therapy (Signed)
[facility] [address] [place], [place], [address] Phone: [phone]   Fax:  [phone]  Physical Therapy Treatment  Patient Details  Name: [name] MRN: [id number] Date of Birth: [birth year] Referring Provider:  Marton Redwood, MD  Encounter Date: 09/22/2014      PT End of Session - 09/22/14 1355    Visit Number 3  G code 3   Number of Visits 12   PT Start Time 1240   PT Stop Time 1320   PT Time Calculation (min) 40 min   Equipment Utilized During Treatment Gait belt   Activity Tolerance Patient tolerated treatment well   Behavior During Therapy [facility] for tasks assessed/performed      Past Medical History  Diagnosis Date  . Osteoporosis, senile   . Elevated cholesterol   . Cancer of breast, female     69  . History of blood clots     sees Dr. Wynonia Lawman  . Complication of anesthesia     "strong pain meds etc, causes dizziness"  . Hypertension     sees Dr. Marton Redwood  . Depression   . Anxiety   . Pneumonia     "bilaterally pneumonia, hospitalized 15 years ago"  . Stroke     hx of blood clots  . Urinary tract infection     hx of  . H/O hiatal hernia     "had surgery past hx"  . Headache(784.0)     "occas"  . Arthritis   . Anemia     hx of  . Irritable bowel syndrome     hx of  . Peptic ulcer disease     hx of  . Coronary artery disease     sees Dr. Wynonia Lawman  . Epilepsy     sees Dr. Erling Cruz  . Seizures   . Colon polyps 04/05/2010    Tubular adenomatous polyps  . GERD (gastroesophageal reflux disease) 04/05/2010  . Dizziness and giddiness   . Alzheimer disease   . Diverticulosis   . Esophageal stricture     Past Surgical History  Procedure Laterality Date  . Cholecystectomy, laparoscopic    . Rotator cuff repair Left   . Hernia repair    . Cholecystectomy    . Eye surgery      bilateral cataract surgery  . Surgery to uterus      "patient unsure of term, states not fibroids"   . Breast surgery Left     lumpectomy  . Dilation and curettage of uterus    . Cardiac catheterization    . Artery biopsy Left 10/01/2012    Procedure: LEFT TEMPORAL ARTERY BIOPSY WITH ULTRASOUND PROBE;  Surgeon: Jerrell Belfast, MD;  Location: [facility];  Service: ENT;  Laterality: Left;  . Breast lumpectomy Right     x 2  . Nissen fundoplication    . Colonoscopy      There were no vitals taken for this visit.  Visit Diagnosis:  Abnormality of gait      Subjective Assessment - 09/22/14 1243    Symptoms The patient reports she is ge tting very careful about her balance and obstacles at home.   Currently in Pain? No/denies     NEUROMUSCULAR RE-EDUCATION: Habituation sit<>bilateral sidelying x 3 reps to each side. Habituation rolling R<>L x 3 reps to each side 180 degree turns with CGA x 3 reps Bending to touch shoe lace and returning to stand with a diagonal pattern Sidestepping with  SBA x 10 ft x 6 reps Corner balance exercises with head turns, 1/2 tandem stance, and eyes closed Sit<>stand x 10 reps x 2 sets  Gait: Gait with emphasis on longer stride length, arm swing x 200 ft x 4 reps Backwards walking with CGA to SBA Heel walking/toe walking with CGA for safety Tandem walking with CGA for safety        PT Short Term Goals - 09/09/14 1429    PT SHORT TERM GOAL #1   Title The patient will perform HEP with family assistance.  Target date 10/08/2014   Time 4   Period Weeks   PT SHORT TERM GOAL #2   Title The patient will improve Berg score from 39/56 up to 45/56 to demo decreasing risk for falls.  Target date 10/08/2014.   Time 4   Period Weeks   PT SHORT TERM GOAL #3   Title The patient will decrease TUG score from 17.21 seconds down to 14 seconds to demo decreasing risk for falls.  Target date 10/08/2014.   Time 4   Period Weeks   PT SHORT TERM GOAL #4   Title The patient will improve gait speed from 2.76 ft/sec up to 3.0 ft/sec to demo improving functional mobility.   Target date 3/3/20164   Time 4   Period Weeks           PT Long Term Goals - 09/09/14 1431    PT LONG TERM GOAL #1   Title The patient will be able to perform post d/c HEP for continued mobility training.  Target date 10/22/2014   Time 6   Period Weeks   PT LONG TERM GOAL #2   Title The patient will improve Berg from 39/56 up to 48/56 for decreased risk for falls.  Target date 10/22/2014.   Time 6   Period Weeks   PT LONG TERM GOAL #3   Title The patient will improve walking speed from 2.76 ft/sec up to 3.3 ft/sec to demo improving functional moblity. Target date 10/22/2014   Time 6   Period Weeks   PT LONG TERM GOAL #4   Title The patient will tolerate bed mobility and return to sit without subjective reports of nausea.  Target date 10/22/2014   Time 6   Period Weeks               Plan - 09/22/14 1355    Clinical Impression Statement The patient reports fatigue with activities today.  PT to consider addint [place] packet as long term home program and will need to demonstrate to patient's caregiver.  PT to progress as patient tolerates.   PT Next Visit Plan Habituation, standing dynamic activities with min A, stepping over objects, etc. *consider adding [place]- teach to husband.   Consulted and Agree with Plan of Care Patient        Problem List Patient Active Problem List   Diagnosis Date Noted  . Abnormality of gait 04/28/2013  . Neck pain 04/28/2013  . Temporal arteritis 10/01/2012    Class: Chronic  . Depression 09/14/2011    Class: Chronic  . Deep vein thrombosis 09/14/2011    Class: Diagnosis of  . Pulmonary embolism 09/14/2011    Class: Diagnosis of  . Stroke 09/14/2011    Class: Diagnosis of  . Seizures 09/14/2011    Class: Chronic  . Breast cancer 09/14/2011    Class: History of  . Memory loss 09/14/2011    Class: Chronic  . Head  trauma 09/14/2011    Class: Acute  . ANEMIA OF CHRONIC DISEASE 02/24/2010  . ANEMIA-UNSPECIFIED 02/24/2010  .  DYSPHAGIA 02/24/2010  . FECAL OCCULT BLOOD 02/24/2010  . PERSONAL HX COLONIC POLYPS 02/24/2010  . ADENOCARCINOMA, BREAST 10/03/2007  . ADENOMATOUS COLONIC POLYP 10/03/2007  . HYPERLIPIDEMIA 10/03/2007  . ANXIETY 10/03/2007  . HYPERTENSION 10/03/2007  . ESOPHAGEAL STRICTURE 10/03/2007  . GERD 10/03/2007  . PEPTIC STRICTURE 10/03/2007  . HIATAL HERNIA 10/03/2007  . DIVERTICULOSIS, COLON 10/03/2007  . OSTEOARTHRITIS 10/03/2007    Kian Ottaviano, PT 09/22/2014, 1:57 PM  [facility] [address] [place], [place], [address] Phone: [phone]   Fax:  [phone]

## 2014-09-25 ENCOUNTER — Ambulatory Visit: Payer: Medicare Other | Admitting: Rehabilitative and Restorative Service Providers"

## 2014-09-25 DIAGNOSIS — Z9181 History of falling: Secondary | ICD-10-CM | POA: Diagnosis not present

## 2014-09-25 DIAGNOSIS — R269 Unspecified abnormalities of gait and mobility: Secondary | ICD-10-CM

## 2014-09-25 DIAGNOSIS — M810 Age-related osteoporosis without current pathological fracture: Secondary | ICD-10-CM | POA: Diagnosis not present

## 2014-09-25 NOTE — Therapy (Signed)
[facility] [address] [place], [place], [address] Phone: [phone]   Fax:  [phone]  Physical Therapy Treatment  Patient Details  Name: [name] MRN: [id number] Date of Birth: 1934-09-22 Referring Provider:  Marton Redwood, MD  Encounter Date: 09/25/2014      PT End of Session - 09/25/14 1720    Visit Number 4  G code 4   Number of Visits 12   PT Start Time 1408   PT Stop Time 1450   PT Time Calculation (min) 42 min   Equipment Utilized During Treatment Gait belt   Activity Tolerance Patient tolerated treatment well   Behavior During Therapy [facility] for tasks assessed/performed      Past Medical History  Diagnosis Date  . Osteoporosis, senile   . Elevated cholesterol   . Cancer of breast, female     58  . History of blood clots     sees Dr. Wynonia Lawman  . Complication of anesthesia     "strong pain meds etc, causes dizziness"  . Hypertension     sees Dr. Marton Redwood  . Depression   . Anxiety   . Pneumonia     "bilaterally pneumonia, hospitalized 15 years ago"  . Stroke     hx of blood clots  . Urinary tract infection     hx of  . H/O hiatal hernia     "had surgery past hx"  . Headache(784.0)     "occas"  . Arthritis   . Anemia     hx of  . Irritable bowel syndrome     hx of  . Peptic ulcer disease     hx of  . Coronary artery disease     sees Dr. Wynonia Lawman  . Epilepsy     sees Dr. Erling Cruz  . Seizures   . Colon polyps 04/05/2010    Tubular adenomatous polyps  . GERD (gastroesophageal reflux disease) 04/05/2010  . Dizziness and giddiness   . Alzheimer disease   . Diverticulosis   . Esophageal stricture     Past Surgical History  Procedure Laterality Date  . Cholecystectomy, laparoscopic    . Rotator cuff repair Left   . Hernia repair    . Cholecystectomy    . Eye surgery      bilateral cataract surgery  . Surgery to uterus      "patient unsure of term, states not fibroids"   . Breast surgery Left     lumpectomy  . Dilation and curettage of uterus    . Cardiac catheterization    . Artery biopsy Left 10/01/2012    Procedure: LEFT TEMPORAL ARTERY BIOPSY WITH ULTRASOUND PROBE;  Surgeon: Jerrell Belfast, MD;  Location: [facility];  Service: ENT;  Laterality: Left;  . Breast lumpectomy Right     x 2  . Nissen fundoplication    . Colonoscopy      There were no vitals taken for this visit.  Visit Diagnosis:  Abnormality of gait      Subjective Assessment - 09/25/14 1410    Symptoms The patient reports she did home exercises, but needed to use her hands.  She reports they felt more challenging than she expected.   Currently in Pain? No/denies     Gait: Ambulation on toes, ambulation on heels, backwards and forwards walking with direction changes 51M=12.82 seconds=2.56 ft/sec  NEUROMUSCULAR RE-EDUCATION: Alternating LE foot taps to cones with min A x 4 reps Up/up down/down to 6"  step x 4 reps each leg Standing marching with CGA Sidestepping activities x 30 ft x 2 reps     [facility] PT Assessment - 09/25/14 1433    Standardized Balance Assessment   Standardized Balance Assessment Berg Balance Test   Berg Balance Test   Sit to Stand Able to stand without using hands and stabilize independently   Standing Unsupported Able to stand safely 2 minutes   Sitting with Back Unsupported but Feet Supported on Floor or Stool Able to sit safely and securely 2 minutes   Stand to Sit Sits safely with minimal use of hands   Transfers Able to transfer safely, minor use of hands   Standing Unsupported with Eyes Closed Able to stand 10 seconds with supervision   Standing Ubsupported with Feet Together Able to place feet together independently and stand 1 minute safely   From Standing, Reach Forward with Outstretched Arm Can reach forward >12 cm safely (5")   From Standing Position, Pick up Object from Floor Able to pick up shoe safely and easily   From Standing Position, Turn to  Look Behind Over each Shoulder Turn sideways only but maintains balance   Turn 360 Degrees Able to turn 360 degrees safely in 4 seconds or less   Standing Unsupported, Alternately Place Feet on Step/Stool Able to stand independently and safely and complete 8 steps in 20 seconds   Standing Unsupported, One Foot in Front Able to plae foot ahead of the other independently and hold 30 seconds   Standing on One Leg Unable to try or needs assist to prevent fall   Total Score 47      THERAPEUTIC EXERCISE: Sitting "w" posture exercises for scap retraction x 10 reps Seated shoulder circles x 10 reps Sit<>stand x 10 reps Seated piriformis stretches x 1 reps each leg         PT Short Term Goals - 09/25/14 1438    PT SHORT TERM GOAL #1   Title The patient will perform HEP with family assistance.  Target date 10/08/2014   Time 4   Period Weeks   PT SHORT TERM GOAL #2   Title The patient will improve Berg score from 39/56 up to 45/56 to demo decreasing risk for falls.  Target date 10/08/2014.   Baseline 09/25/2014 met.  Pt scored 47/56.     Time 4   Period Weeks   Status Achieved   PT SHORT TERM GOAL #3   Title The patient will decrease TUG score from 17.21 seconds down to 14 seconds to demo decreasing risk for falls.  Target date 10/08/2014.   Time 4   Period Weeks   PT SHORT TERM GOAL #4   Title The patient will improve gait speed from 2.76 ft/sec up to 3.0 ft/sec to demo improving functional mobility.  Target date 3/3/20164   Time 4   Period Weeks           PT Long Term Goals - 09/09/14 1431    PT LONG TERM GOAL #1   Title The patient will be able to perform post d/c HEP for continued mobility training.  Target date 10/22/2014   Time 6   Period Weeks   PT LONG TERM GOAL #2   Title The patient will improve Berg from 39/56 up to 48/56 for decreased risk for falls.  Target date 10/22/2014.   Time 6   Period Weeks   PT LONG TERM GOAL #3   Title The patient will improve  walking speed  from 2.76 ft/sec up to 3.3 ft/sec to demo improving functional moblity. Target date 10/22/2014   Time 6   Period Weeks   PT LONG TERM GOAL #4   Title The patient will tolerate bed mobility and return to sit without subjective reports of nausea.  Target date 10/22/2014   Time 6   Period Weeks               Plan - 09/25/14 1720    Clinical Impression Statement The patient fatigues with activities in therapy.  PT focused on adding postural activities to improve core engaging during standing balance tasks.   PT Next Visit Plan Add [place] exercises, postural training.   Consulted and Agree with Plan of Care Patient        Problem List Patient Active Problem List   Diagnosis Date Noted  . Abnormality of gait 04/28/2013  . Neck pain 04/28/2013  . Temporal arteritis 10/01/2012    Class: Chronic  . Depression 09/14/2011    Class: Chronic  . Deep vein thrombosis 09/14/2011    Class: Diagnosis of  . Pulmonary embolism 09/14/2011    Class: Diagnosis of  . Stroke 09/14/2011    Class: Diagnosis of  . Seizures 09/14/2011    Class: Chronic  . Breast cancer 09/14/2011    Class: History of  . Memory loss 09/14/2011    Class: Chronic  . Head trauma 09/14/2011    Class: Acute  . ANEMIA OF CHRONIC DISEASE 02/24/2010  . ANEMIA-UNSPECIFIED 02/24/2010  . DYSPHAGIA 02/24/2010  . FECAL OCCULT BLOOD 02/24/2010  . PERSONAL HX COLONIC POLYPS 02/24/2010  . ADENOCARCINOMA, BREAST 10/03/2007  . ADENOMATOUS COLONIC POLYP 10/03/2007  . HYPERLIPIDEMIA 10/03/2007  . ANXIETY 10/03/2007  . HYPERTENSION 10/03/2007  . ESOPHAGEAL STRICTURE 10/03/2007  . GERD 10/03/2007  . PEPTIC STRICTURE 10/03/2007  . HIATAL HERNIA 10/03/2007  . DIVERTICULOSIS, COLON 10/03/2007  . OSTEOARTHRITIS 10/03/2007    Mekisha Bittel, PT 09/25/2014, 5:22 PM  [facility] [address] [place], [place], [address] Phone: [phone]   Fax:   [phone]

## 2014-09-29 ENCOUNTER — Ambulatory Visit: Payer: Medicare Other | Admitting: Rehabilitative and Restorative Service Providers"

## 2014-09-29 DIAGNOSIS — Z9181 History of falling: Secondary | ICD-10-CM | POA: Diagnosis not present

## 2014-09-29 DIAGNOSIS — R269 Unspecified abnormalities of gait and mobility: Secondary | ICD-10-CM

## 2014-09-29 DIAGNOSIS — M810 Age-related osteoporosis without current pathological fracture: Secondary | ICD-10-CM | POA: Diagnosis not present

## 2014-09-29 NOTE — Therapy (Signed)
[facility] [address] [place], [place], [address] Phone: [phone]   Fax:  [phone]  Physical Therapy Treatment  Patient Details  Name: [name] MRN: [id number] Date of Birth: [birth year] Referring Provider:  Marton Redwood, MD  Encounter Date: 09/29/2014      PT End of Session - 09/29/14 1339    Visit Number 5  G code 5   Number of Visits 12   PT Start Time 1240   PT Stop Time 1320   PT Time Calculation (min) 40 min   Activity Tolerance Patient tolerated treatment well   Behavior During Therapy [facility] for tasks assessed/performed      Past Medical History  Diagnosis Date  . Osteoporosis, senile   . Elevated cholesterol   . Cancer of breast, female     50  . History of blood clots     sees Dr. Wynonia Lawman  . Complication of anesthesia     "strong pain meds etc, causes dizziness"  . Hypertension     sees Dr. Marton Redwood  . Depression   . Anxiety   . Pneumonia     "bilaterally pneumonia, hospitalized 15 years ago"  . Stroke     hx of blood clots  . Urinary tract infection     hx of  . H/O hiatal hernia     "had surgery past hx"  . Headache(784.0)     "occas"  . Arthritis   . Anemia     hx of  . Irritable bowel syndrome     hx of  . Peptic ulcer disease     hx of  . Coronary artery disease     sees Dr. Wynonia Lawman  . Epilepsy     sees Dr. Erling Cruz  . Seizures   . Colon polyps 04/05/2010    Tubular adenomatous polyps  . GERD (gastroesophageal reflux disease) 04/05/2010  . Dizziness and giddiness   . Alzheimer disease   . Diverticulosis   . Esophageal stricture     Past Surgical History  Procedure Laterality Date  . Cholecystectomy, laparoscopic    . Rotator cuff repair Left   . Hernia repair    . Cholecystectomy    . Eye surgery      bilateral cataract surgery  . Surgery to uterus      "patient unsure of term, states not fibroids"  . Breast surgery Left     lumpectomy  .  Dilation and curettage of uterus    . Cardiac catheterization    . Artery biopsy Left 10/01/2012    Procedure: LEFT TEMPORAL ARTERY BIOPSY WITH ULTRASOUND PROBE;  Surgeon: Jerrell Belfast, MD;  Location: [facility];  Service: ENT;  Laterality: Left;  . Breast lumpectomy Right     x 2  . Nissen fundoplication    . Colonoscopy      There were no vitals taken for this visit.  Visit Diagnosis:  Abnormality of gait      Subjective Assessment - 09/29/14 1241    Symptoms The patient reports she has had a lot of appointments this week.     Currently in Pain? No/denies     NEUROMUSCULAR RE-EDUCATION: Sit<>bilateral sidelying x 5 reps with R side worse than L reporting mild sensation of spinning.  Pt reports symptoms improve with repetition.      Balance Exercises - 09/29/14 1253    [place] PROGRAM   Head Movements Standing;5 reps   Neck Movements Standing;5 reps  Back Extension Standing;5 reps   Trunk Movements Standing;5 reps   Ankle Movements Sitting;10 reps   Knee Extensor 10 reps   Knee Flexor 10 reps   Hip ABductor 5 reps   Ankle Plantorflexors 20 reps, support   Ankle Dorsiflexors 20 reps, support   Knee Bends 10 reps, support   Sit to Stand 10 reps, bilateral support     * all [place] given for HEP with discussion with patient's husband to provide supervision with a reminder to keep eyes open during tasks.      PT Education - 09/29/14 1338    Education provided Yes   Education Details [facility] program:  posture section, strengthening section, mini squats and sit<>stand.   Person(s) Educated Patient;Spouse   Methods Explanation;Demonstration;Handout   Comprehension Returned demonstration;Verbalized understanding          PT Short Term Goals - 09/29/14 1339    PT SHORT TERM GOAL #1   Title The patient will perform HEP with family assistance.  Target date 10/08/2014   Baseline [place] program provided 09/29/14 with instruction to patient and her husband.   Time 4   Period  Weeks   Status Achieved   PT SHORT TERM GOAL #2   Title The patient will improve Berg score from 39/56 up to 45/56 to demo decreasing risk for falls.  Target date 10/08/2014.   Baseline 09/25/2014 met.  Pt scored 47/56.     Time 4   Period Weeks   Status Achieved   PT SHORT TERM GOAL #3   Title The patient will decrease TUG score from 17.21 seconds down to 14 seconds to demo decreasing risk for falls.  Target date 10/08/2014.   Time 4   Period Weeks   PT SHORT TERM GOAL #4   Title The patient will improve gait speed from 2.76 ft/sec up to 3.0 ft/sec to demo improving functional mobility.  Target date 3/3/20164   Time 4   Period Weeks           PT Long Term Goals - 09/09/14 1431    PT LONG TERM GOAL #1   Title The patient will be able to perform post d/c HEP for continued mobility training.  Target date 10/22/2014   Time 6   Period Weeks   PT LONG TERM GOAL #2   Title The patient will improve Berg from 39/56 up to 48/56 for decreased risk for falls.  Target date 10/22/2014.   Time 6   Period Weeks   PT LONG TERM GOAL #3   Title The patient will improve walking speed from 2.76 ft/sec up to 3.3 ft/sec to demo improving functional moblity. Target date 10/22/2014   Time 6   Period Weeks   PT LONG TERM GOAL #4   Title The patient will tolerate bed mobility and return to sit without subjective reports of nausea.  Target date 10/22/2014   Time 6   Period Weeks               Plan - 09/29/14 1341    Clinical Impression Statement The patient reports her husband is having knee surgery and she will need to hold PT after this week.  Therefore, [employer] provided.  Plan to provide further [facility] balance section at next visit and schedule f/u when her husband has returned to driving.   PT Next Visit Plan [place] balance section, habituation, posture.   Consulted and Agree with Plan of Care Patient  Problem List Patient Active Problem List   Diagnosis Date Noted  . Abnormality  of gait 04/28/2013  . Neck pain 04/28/2013  . Temporal arteritis 10/01/2012    Class: Chronic  . Depression 09/14/2011    Class: Chronic  . Deep vein thrombosis 09/14/2011    Class: Diagnosis of  . Pulmonary embolism 09/14/2011    Class: Diagnosis of  . Stroke 09/14/2011    Class: Diagnosis of  . Seizures 09/14/2011    Class: Chronic  . Breast cancer 09/14/2011    Class: History of  . Memory loss 09/14/2011    Class: Chronic  . Head trauma 09/14/2011    Class: Acute  . ANEMIA OF CHRONIC DISEASE 02/24/2010  . ANEMIA-UNSPECIFIED 02/24/2010  . DYSPHAGIA 02/24/2010  . FECAL OCCULT BLOOD 02/24/2010  . PERSONAL HX COLONIC POLYPS 02/24/2010  . ADENOCARCINOMA, BREAST 10/03/2007  . ADENOMATOUS COLONIC POLYP 10/03/2007  . HYPERLIPIDEMIA 10/03/2007  . ANXIETY 10/03/2007  . HYPERTENSION 10/03/2007  . ESOPHAGEAL STRICTURE 10/03/2007  . GERD 10/03/2007  . PEPTIC STRICTURE 10/03/2007  . HIATAL HERNIA 10/03/2007  . DIVERTICULOSIS, COLON 10/03/2007  . OSTEOARTHRITIS 10/03/2007    Yi Falletta, PT 09/29/2014, 1:43 PM  [facility] [address] [place], [place], [address] Phone: [phone]   Fax:  [phone]

## 2014-10-02 ENCOUNTER — Ambulatory Visit: Payer: Medicare Other | Admitting: Rehabilitative and Restorative Service Providers"

## 2014-10-02 DIAGNOSIS — Z9181 History of falling: Secondary | ICD-10-CM | POA: Diagnosis not present

## 2014-10-02 DIAGNOSIS — M810 Age-related osteoporosis without current pathological fracture: Secondary | ICD-10-CM | POA: Diagnosis not present

## 2014-10-02 DIAGNOSIS — R269 Unspecified abnormalities of gait and mobility: Secondary | ICD-10-CM | POA: Diagnosis not present

## 2014-10-02 NOTE — Therapy (Signed)
[facility] [address] [place], [place], [address] Phone: [phone]   Fax:  [phone]  Physical Therapy Treatment  Patient Details  Name: [name] MRN: [id number] Date of Birth: May 10, 1935 Referring Provider:  Marton Redwood, MD  Encounter Date: 10/02/2014      PT End of Session - 10/02/14 1409    Visit Number 6  G code 6   Number of Visits 12   PT Start Time 1405   PT Stop Time 1445   PT Time Calculation (min) 40 min   Activity Tolerance Patient tolerated treatment well   Behavior During Therapy [facility] for tasks assessed/performed      Past Medical History  Diagnosis Date  . Osteoporosis, senile   . Elevated cholesterol   . Cancer of breast, female     56  . History of blood clots     sees Dr. Wynonia Lawman  . Complication of anesthesia     "strong pain meds etc, causes dizziness"  . Hypertension     sees Dr. Marton Redwood  . Depression   . Anxiety   . Pneumonia     "bilaterally pneumonia, hospitalized 15 years ago"  . Stroke     hx of blood clots  . Urinary tract infection     hx of  . H/O hiatal hernia     "had surgery past hx"  . Headache(784.0)     "occas"  . Arthritis   . Anemia     hx of  . Irritable bowel syndrome     hx of  . Peptic ulcer disease     hx of  . Coronary artery disease     sees Dr. Wynonia Lawman  . Epilepsy     sees Dr. Erling Cruz  . Seizures   . Colon polyps 04/05/2010    Tubular adenomatous polyps  . GERD (gastroesophageal reflux disease) 04/05/2010  . Dizziness and giddiness   . Alzheimer disease   . Diverticulosis   . Esophageal stricture     Past Surgical History  Procedure Laterality Date  . Cholecystectomy, laparoscopic    . Rotator cuff repair Left   . Hernia repair    . Cholecystectomy    . Eye surgery      bilateral cataract surgery  . Surgery to uterus      "patient unsure of term, states not fibroids"  . Breast surgery Left     lumpectomy  .  Dilation and curettage of uterus    . Cardiac catheterization    . Artery biopsy Left 10/01/2012    Procedure: LEFT TEMPORAL ARTERY BIOPSY WITH ULTRASOUND PROBE;  Surgeon: Jerrell Belfast, MD;  Location: [facility];  Service: ENT;  Laterality: Left;  . Breast lumpectomy Right     x 2  . Nissen fundoplication    . Colonoscopy      There were no vitals taken for this visit.  Visit Diagnosis:  Abnormality of gait      Subjective Assessment - 10/02/14 1408    Symptoms The patient did her HEP [facility]) and reports she did not want her husband to help her as recommended.       NEUROMUSCULAR RE-EDUCATION: [place] HEP (see below) and provided for HEP to use while out of PT (after husband's knee surgery) Sit<>bilateral sidelying for habituation with symptoms of head heaviness to the left side.  Repeated x 4 reps to patient tolerance.      [facility] PT Assessment - 10/02/14 1411  Timed Up and Go Test   TUG --  13.75 seconds without device          Balance Exercises - 10/02/14 1415    [place] PROGRAM   Backwards Walking Support   Sideways Walking No assistive device   Tandem Stance 10 seconds, support  did not add for HEP   Tandem Walk Support   One Leg Stand 10 seconds, support   Heel Walking Support   Toe Walk Support   Sit to Stand 10 reps, bilateral support      THERAPEUTIC ACTIVITIES: Floor transfers <>stand with UE support modified indep      PT Education - 10/02/14 1552    Education Details HEP:  backwards walking, sidestepping, tandem gait, single leg stance, heel walking, toe walking   Person(s) Educated Patient;Spouse   Methods Explanation;Demonstration;Handout   Comprehension Returned demonstration;Verbalized understanding          PT Short Term Goals - 10/02/14 1410    PT SHORT TERM GOAL #1   Title The patient will perform HEP with family assistance.  Target date 10/08/2014   Baseline [place] program provided 09/29/14 with instruction to patient and her husband.   Time  4   Period Weeks   Status Achieved   PT SHORT TERM GOAL #2   Title The patient will improve Berg score from 39/56 up to 45/56 to demo decreasing risk for falls.  Target date 10/08/2014.   Baseline 09/25/2014 met.  Pt scored 47/56.     Time 4   Period Weeks   Status Achieved   PT SHORT TERM GOAL #3   Title The patient will decrease TUG score from 17.21 seconds down to 14 seconds to demo decreasing risk for falls.  Target date 10/08/2014.   Baseline Met on 10/02/2014 scoring 13.75 seconds   Time 4   Period Weeks   Status Achieved   PT SHORT TERM GOAL #4   Title The patient will improve gait speed from 2.76 ft/sec up to 3.0 ft/sec to demo improving functional mobility.  Target date 3/3/20164   Time 4   Period Weeks           PT Long Term Goals - 09/09/14 1431    PT LONG TERM GOAL #1   Title The patient will be able to perform post d/c HEP for continued mobility training.  Target date 10/22/2014   Time 6   Period Weeks   PT LONG TERM GOAL #2   Title The patient will improve Berg from 39/56 up to 48/56 for decreased risk for falls.  Target date 10/22/2014.   Time 6   Period Weeks   PT LONG TERM GOAL #3   Title The patient will improve walking speed from 2.76 ft/sec up to 3.3 ft/sec to demo improving functional moblity. Target date 10/22/2014   Time 6   Period Weeks   PT LONG TERM GOAL #4   Title The patient will tolerate bed mobility and return to sit without subjective reports of nausea.  Target date 10/22/2014   Time 6   Period Weeks               Plan - 10/02/14 1554    Clinical Impression Statement The patient requests to hold remaining PT visits at this time. She plans to continue [place] program at home while her husband is recovering from surgery.  She has met 3/4 STGs at this time.   PT Next Visit Plan Check [place], habituation, high level  balance.  Pt on hold until calls to schedule after her husban'd surgery.   Consulted and Agree with Plan of Care Patient         Problem List Patient Active Problem List   Diagnosis Date Noted  . Abnormality of gait 04/28/2013  . Neck pain 04/28/2013  . Temporal arteritis 10/01/2012    Class: Chronic  . Depression 09/14/2011    Class: Chronic  . Deep vein thrombosis 09/14/2011    Class: Diagnosis of  . Pulmonary embolism 09/14/2011    Class: Diagnosis of  . Stroke 09/14/2011    Class: Diagnosis of  . Seizures 09/14/2011    Class: Chronic  . Breast cancer 09/14/2011    Class: History of  . Memory loss 09/14/2011    Class: Chronic  . Head trauma 09/14/2011    Class: Acute  . ANEMIA OF CHRONIC DISEASE 02/24/2010  . ANEMIA-UNSPECIFIED 02/24/2010  . DYSPHAGIA 02/24/2010  . FECAL OCCULT BLOOD 02/24/2010  . PERSONAL HX COLONIC POLYPS 02/24/2010  . ADENOCARCINOMA, BREAST 10/03/2007  . ADENOMATOUS COLONIC POLYP 10/03/2007  . HYPERLIPIDEMIA 10/03/2007  . ANXIETY 10/03/2007  . HYPERTENSION 10/03/2007  . ESOPHAGEAL STRICTURE 10/03/2007  . GERD 10/03/2007  . PEPTIC STRICTURE 10/03/2007  . HIATAL HERNIA 10/03/2007  . DIVERTICULOSIS, COLON 10/03/2007  . OSTEOARTHRITIS 10/03/2007    Alexah Kivett, PT 10/02/2014, 3:55 PM  [facility] [address] [place], [place], [address] Phone: [phone]   Fax:  [phone]

## 2014-10-06 ENCOUNTER — Ambulatory Visit: Payer: Medicare Other | Admitting: Rehabilitative and Restorative Service Providers"

## 2014-10-09 ENCOUNTER — Encounter: Payer: Medicare Other | Admitting: Rehabilitative and Restorative Service Providers"

## 2014-10-13 ENCOUNTER — Encounter: Payer: Medicare Other | Admitting: Rehabilitative and Restorative Service Providers"

## 2014-10-16 ENCOUNTER — Encounter: Payer: Medicare Other | Admitting: Rehabilitative and Restorative Service Providers"

## 2014-10-29 ENCOUNTER — Other Ambulatory Visit: Payer: Self-pay

## 2014-11-16 DIAGNOSIS — M1990 Unspecified osteoarthritis, unspecified site: Secondary | ICD-10-CM | POA: Diagnosis not present

## 2014-11-16 DIAGNOSIS — R51 Headache: Secondary | ICD-10-CM | POA: Diagnosis not present

## 2014-11-16 DIAGNOSIS — M542 Cervicalgia: Secondary | ICD-10-CM | POA: Diagnosis not present

## 2014-11-16 DIAGNOSIS — G309 Alzheimer's disease, unspecified: Secondary | ICD-10-CM | POA: Diagnosis not present

## 2014-11-18 DIAGNOSIS — J3081 Allergic rhinitis due to animal (cat) (dog) hair and dander: Secondary | ICD-10-CM | POA: Diagnosis not present

## 2014-11-18 DIAGNOSIS — J301 Allergic rhinitis due to pollen: Secondary | ICD-10-CM | POA: Diagnosis not present

## 2014-11-18 DIAGNOSIS — J3089 Other allergic rhinitis: Secondary | ICD-10-CM | POA: Diagnosis not present

## 2014-12-03 DIAGNOSIS — I8290 Acute embolism and thrombosis of unspecified vein: Secondary | ICD-10-CM | POA: Diagnosis not present

## 2014-12-03 DIAGNOSIS — Z6830 Body mass index (BMI) 30.0-30.9, adult: Secondary | ICD-10-CM | POA: Diagnosis not present

## 2014-12-03 DIAGNOSIS — Z7901 Long term (current) use of anticoagulants: Secondary | ICD-10-CM | POA: Diagnosis not present

## 2015-01-12 DIAGNOSIS — I8290 Acute embolism and thrombosis of unspecified vein: Secondary | ICD-10-CM | POA: Diagnosis not present

## 2015-01-12 DIAGNOSIS — Z7901 Long term (current) use of anticoagulants: Secondary | ICD-10-CM | POA: Diagnosis not present

## 2015-01-15 DIAGNOSIS — I8290 Acute embolism and thrombosis of unspecified vein: Secondary | ICD-10-CM | POA: Diagnosis not present

## 2015-01-15 DIAGNOSIS — Z7901 Long term (current) use of anticoagulants: Secondary | ICD-10-CM | POA: Diagnosis not present

## 2015-01-21 DIAGNOSIS — Z7901 Long term (current) use of anticoagulants: Secondary | ICD-10-CM | POA: Diagnosis not present

## 2015-01-21 DIAGNOSIS — I8290 Acute embolism and thrombosis of unspecified vein: Secondary | ICD-10-CM | POA: Diagnosis not present

## 2015-01-28 DIAGNOSIS — R413 Other amnesia: Secondary | ICD-10-CM | POA: Diagnosis not present

## 2015-02-05 DIAGNOSIS — I8290 Acute embolism and thrombosis of unspecified vein: Secondary | ICD-10-CM | POA: Diagnosis not present

## 2015-02-05 DIAGNOSIS — Z7901 Long term (current) use of anticoagulants: Secondary | ICD-10-CM | POA: Diagnosis not present

## 2015-02-12 DIAGNOSIS — I10 Essential (primary) hypertension: Secondary | ICD-10-CM | POA: Diagnosis not present

## 2015-02-12 DIAGNOSIS — Z0000 Encounter for general adult medical examination without abnormal findings: Secondary | ICD-10-CM | POA: Diagnosis not present

## 2015-02-12 DIAGNOSIS — R7301 Impaired fasting glucose: Secondary | ICD-10-CM | POA: Diagnosis not present

## 2015-02-12 DIAGNOSIS — M810 Age-related osteoporosis without current pathological fracture: Secondary | ICD-10-CM | POA: Diagnosis not present

## 2015-02-12 DIAGNOSIS — E785 Hyperlipidemia, unspecified: Secondary | ICD-10-CM | POA: Diagnosis not present

## 2015-02-12 DIAGNOSIS — N390 Urinary tract infection, site not specified: Secondary | ICD-10-CM | POA: Diagnosis not present

## 2015-02-18 DIAGNOSIS — G309 Alzheimer's disease, unspecified: Secondary | ICD-10-CM | POA: Diagnosis not present

## 2015-02-18 DIAGNOSIS — F419 Anxiety disorder, unspecified: Secondary | ICD-10-CM | POA: Diagnosis not present

## 2015-02-18 DIAGNOSIS — G40909 Epilepsy, unspecified, not intractable, without status epilepticus: Secondary | ICD-10-CM | POA: Diagnosis not present

## 2015-02-18 DIAGNOSIS — G4700 Insomnia, unspecified: Secondary | ICD-10-CM | POA: Diagnosis not present

## 2015-02-18 DIAGNOSIS — R51 Headache: Secondary | ICD-10-CM | POA: Diagnosis not present

## 2015-02-18 DIAGNOSIS — M542 Cervicalgia: Secondary | ICD-10-CM | POA: Diagnosis not present

## 2015-02-18 DIAGNOSIS — R2689 Other abnormalities of gait and mobility: Secondary | ICD-10-CM | POA: Diagnosis not present

## 2015-02-19 DIAGNOSIS — E785 Hyperlipidemia, unspecified: Secondary | ICD-10-CM | POA: Diagnosis not present

## 2015-02-19 DIAGNOSIS — G309 Alzheimer's disease, unspecified: Secondary | ICD-10-CM | POA: Diagnosis not present

## 2015-02-19 DIAGNOSIS — I8290 Acute embolism and thrombosis of unspecified vein: Secondary | ICD-10-CM | POA: Diagnosis not present

## 2015-02-19 DIAGNOSIS — Z0000 Encounter for general adult medical examination without abnormal findings: Secondary | ICD-10-CM | POA: Diagnosis not present

## 2015-02-19 DIAGNOSIS — Z1389 Encounter for screening for other disorder: Secondary | ICD-10-CM | POA: Diagnosis not present

## 2015-02-19 DIAGNOSIS — R7301 Impaired fasting glucose: Secondary | ICD-10-CM | POA: Diagnosis not present

## 2015-02-19 DIAGNOSIS — Z7901 Long term (current) use of anticoagulants: Secondary | ICD-10-CM | POA: Diagnosis not present

## 2015-02-19 DIAGNOSIS — I10 Essential (primary) hypertension: Secondary | ICD-10-CM | POA: Diagnosis not present

## 2015-02-19 DIAGNOSIS — I2510 Atherosclerotic heart disease of native coronary artery without angina pectoris: Secondary | ICD-10-CM | POA: Diagnosis not present

## 2015-02-23 DIAGNOSIS — Z1212 Encounter for screening for malignant neoplasm of rectum: Secondary | ICD-10-CM | POA: Diagnosis not present

## 2015-02-24 ENCOUNTER — Encounter: Payer: Self-pay | Admitting: Internal Medicine

## 2015-03-01 ENCOUNTER — Other Ambulatory Visit: Payer: Self-pay | Admitting: Internal Medicine

## 2015-03-01 DIAGNOSIS — Z1231 Encounter for screening mammogram for malignant neoplasm of breast: Secondary | ICD-10-CM

## 2015-03-25 ENCOUNTER — Encounter: Payer: Self-pay | Admitting: Rehabilitative and Restorative Service Providers"

## 2015-03-25 NOTE — Therapy (Signed)
[facility] [address] [place], [place], [address] Phone: [phone]   Fax:  [phone]  Patient Details  Name: [name] MRN: [id number] Date of Birth: 09/26/1934 Referring Provider:  No ref. provider found  Encounter Date: last encounter 09/29/2014  PHYSICAL THERAPY DISCHARGE SUMMARY  Visits from Start of Care: 6  Current functional level related to goals / functional outcomes:     PT Short Term Goals - 10/02/14 1410    PT SHORT TERM GOAL #1   Title The patient will perform HEP with family assistance.  Target date 10/08/2014   Baseline [place] program provided 09/29/14 with instruction to patient and her husband.   Time 4   Period Weeks   Status Achieved   PT SHORT TERM GOAL #2   Title The patient will improve Berg score from 39/56 up to 45/56 to demo decreasing risk for falls.  Target date 10/08/2014.   Baseline 09/25/2014 met.  Pt scored 47/56.     Time 4   Period Weeks   Status Achieved   PT SHORT TERM GOAL #3   Title The patient will decrease TUG score from 17.21 seconds down to 14 seconds to demo decreasing risk for falls.  Target date 10/08/2014.   Baseline Met on 10/02/2014 scoring 13.75 seconds   Time 4   Period Weeks   Status Achieved   PT SHORT TERM GOAL #4   Title The patient will improve gait speed from 2.76 ft/sec up to 3.0 ft/sec to demo improving functional mobility.  Target date 3/3/20164   Time 4   Period Weeks     *long term goals not checked as patient did not return after her husband's knee surgery   Remaining deficits: Continued imbalance and abnormality of gait.   Education / Equipment: [employer], safety.  Plan: Patient agrees to discharge.  Patient goals were partially met. Patient is being discharged due to meeting the stated rehab goals.  ?????      Thank you for the referral of this patient. Rudell Cobb, MPT   [name] 03/25/2015, 12:59 PM  [facility] [address] [place], [place], [address] Phone: [phone]   Fax:  [phone]

## 2015-04-22 DIAGNOSIS — M542 Cervicalgia: Secondary | ICD-10-CM | POA: Diagnosis not present

## 2015-04-22 DIAGNOSIS — G309 Alzheimer's disease, unspecified: Secondary | ICD-10-CM | POA: Diagnosis not present

## 2015-04-22 DIAGNOSIS — G40909 Epilepsy, unspecified, not intractable, without status epilepticus: Secondary | ICD-10-CM | POA: Diagnosis not present

## 2015-04-22 DIAGNOSIS — R2689 Other abnormalities of gait and mobility: Secondary | ICD-10-CM | POA: Diagnosis not present

## 2015-04-22 DIAGNOSIS — G4700 Insomnia, unspecified: Secondary | ICD-10-CM | POA: Diagnosis not present

## 2015-04-22 DIAGNOSIS — F419 Anxiety disorder, unspecified: Secondary | ICD-10-CM | POA: Diagnosis not present

## 2015-04-23 ENCOUNTER — Ambulatory Visit: Payer: Medicare Other | Attending: Internal Medicine | Admitting: Rehabilitative and Restorative Service Providers"

## 2015-04-23 DIAGNOSIS — Z9181 History of falling: Secondary | ICD-10-CM | POA: Diagnosis not present

## 2015-04-23 DIAGNOSIS — R269 Unspecified abnormalities of gait and mobility: Secondary | ICD-10-CM | POA: Diagnosis not present

## 2015-04-23 DIAGNOSIS — R296 Repeated falls: Secondary | ICD-10-CM | POA: Insufficient documentation

## 2015-04-23 NOTE — Therapy (Signed)
[facility] [address] [place], [place], [address] Phone: [phone]   Fax:  [phone]  Physical Therapy Evaluation  Patient Details  Name: [name] MRN: [id number] Date of Birth: [birth year] Referring Provider:  Marton Redwood, MD  Encounter Date: 04/23/2015      PT End of Session - 04/23/15 1323    Visit Number 1   Number of Visits 12   Date for PT Re-Evaluation 06/21/15   Authorization Type G code every 10th visit   PT Start Time 1230   PT Stop Time 1315   PT Time Calculation (min) 45 min   Equipment Utilized During Treatment Gait belt   Activity Tolerance Patient tolerated treatment well   Behavior During Therapy [facility] for tasks assessed/performed      Past Medical History  Diagnosis Date  . Osteoporosis, senile   . Elevated cholesterol   . Cancer of breast, female     9  . History of blood clots     sees Dr. Wynonia Lawman  . Complication of anesthesia     "strong pain meds etc, causes dizziness"  . Hypertension     sees Dr. Marton Redwood  . Depression   . Anxiety   . Pneumonia     "bilaterally pneumonia, hospitalized 15 years ago"  . Stroke     hx of blood clots  . Urinary tract infection     hx of  . H/O hiatal hernia     "had surgery past hx"  . Headache(784.0)     "occas"  . Arthritis   . Anemia     hx of  . Irritable bowel syndrome     hx of  . Peptic ulcer disease     hx of  . Coronary artery disease     sees Dr. Wynonia Lawman  . Epilepsy     sees Dr. Erling Cruz  . Seizures   . Colon polyps 04/05/2010    Tubular adenomatous polyps  . GERD (gastroesophageal reflux disease) 04/05/2010  . Dizziness and giddiness   . Alzheimer disease   . Diverticulosis   . Esophageal stricture     Past Surgical History  Procedure Laterality Date  . Cholecystectomy, laparoscopic    . Rotator cuff repair Left   . Hernia repair    . Cholecystectomy    . Eye surgery      bilateral cataract surgery   . Surgery to uterus      "patient unsure of term, states not fibroids"  . Breast surgery Left     lumpectomy  . Dilation and curettage of uterus    . Cardiac catheterization    . Artery biopsy Left 10/01/2012    Procedure: LEFT TEMPORAL ARTERY BIOPSY WITH ULTRASOUND PROBE;  Surgeon: Jerrell Belfast, MD;  Location: [facility];  Service: ENT;  Laterality: Left;  . Breast lumpectomy Right     x 2  . Nissen fundoplication    . Colonoscopy      There were no vitals filed for this visit.  Visit Diagnosis:  Abnormality of gait      Subjective Assessment - 04/23/15 1236    Subjective The patient is known to our clinic from prior PT from 09/2014.  She was unable to continue due to her husband undergoing knee surgery.  She reports she is continuing to fall regularly.  She is not doing HEP because she reports she had a fall and hurt herself and was unable to return to the  exercises.     Patient Stated Goals Improve the way I move and balance.   Currently in Pain? Yes   Pain Score 5    Pain Location --  back and knee, shoulder hurts from a fall   Pain Orientation --  left knee and low back   Pain Descriptors / Indicators Aching   Pain Onset More than a month ago   Pain Frequency Constant   Aggravating Factors  unsure   Pain Relieving Factors unsure   Effect of Pain on Daily Activities "keeps me from doing a lot of things I used to do"            [facility] PT Assessment - 04/23/15 1243    Assessment   Medical Diagnosis unsteady gait   Prior Therapy known to our clinic from 09/2014 for gait   Precautions   Precautions Fall   Balance Screen   Has the patient fallen in the past 6 months Yes   How many times? --  6   Has the patient had a decrease in activity level because of a fear of falling?  Yes   Is the patient reluctant to leave their home because of a fear of falling?  Yes   [place] residence   Living Arrangements Spouse/significant other    Type of [place] to enter   Entrance Stairs-Number of Steps --  1   Entrance Stairs-Rails --  holds door trim   Home Layout One level   [place] - 2 wheels   Prior Function   Level of Independence Independent with basic ADLs  pt reports she stopped cooking due to burning too many thing   Cognition   Overall Cognitive Status History of cognitive impairments - at baseline   Observation/Other Assessments   Focus on Therapeutic Outcomes (FOTO)  n/a due to cognitive changes   Sensation   Light Touch Appears Intact   Posture/Postural Control   Posture/Postural Control Postural limitations   Postural Limitations Rounded Shoulders;Forward head   ROM / Strength   AROM / PROM / Strength AROM;Strength   AROM   Overall AROM Comments tightness and pain reported with overhead reaching in R shoulder, others WFLs   Strength   Overall Strength Comments R shoulder painful with minimal resistance for flexion, 4/5 L shoulder flexion, 4/5 bilateral hip flexion, knee flexion/extension, ankle dorsiflexion   Ambulation/Gait   Ambulation/Gait Yes   Ambulation/Gait Assistance 5: Supervision   Ambulation Distance (Feet) --  200   Gait Pattern Decreased stride length;Shuffle   Ambulation Surface Level   Gait velocity 2.53 ft/sec   Stairs Yes   Stairs Assistance 5: Supervision   Stair Management Technique Two rails;Alternating pattern   Number of Stairs 4   Standardized Balance Assessment   Standardized Balance Assessment Berg Balance Test;Timed Up and Go Test   Berg Balance Test   Sit to Stand Able to stand  independently using hands   Standing Unsupported Able to stand 2 minutes with supervision   Sitting with Back Unsupported but Feet Supported on Floor or Stool Able to sit safely and securely 2 minutes   Stand to Sit Sits safely with minimal use of hands   Transfers Able to transfer safely, definite need of hands   Standing Unsupported with Eyes Closed Able to  stand 10 seconds with supervision   Standing Ubsupported with Feet Together Able to place feet together independently and stand  for 1 minute with supervision   From Standing, Reach Forward with Outstretched Arm Reaches forward but needs supervision   From Standing Position, Pick up Object from [place] to pick up shoe, needs supervision   From Standing Position, Turn to Look Behind Over each Shoulder Needs supervision when turning   Turn 360 Degrees Needs close supervision or verbal cueing   Standing Unsupported, Alternately Place Feet on Step/Stool Able to complete >2 steps/needs minimal assist   Standing Unsupported, One Foot in [place] to take small step independently and hold 30 seconds   Standing on One Leg Tries to lift leg/unable to hold 3 seconds but remains standing independently   Total Score 33   Berg comment: 33/56 indicates high fall risk   Timed Up and Go Test   TUG --  15.95 seconds without device SBA for safety               PT Short Term Goals - 04/23/15 1324    PT SHORT TERM GOAL #1   Title The patient will perform HEP with family assistance for balance, strengthening, and flexibility to reduce pain + improve moiblity.     Baseline Target date 05/22/2015   Time 4   Period Weeks   PT SHORT TERM GOAL #2   Title The patient will improve Berg score from 33/56 up to 39/56 to demo decreasing risk for falls.     Baseline Target date 05/22/2015   Time 4   Period Weeks   PT SHORT TERM GOAL #3   Title The patient will improve gait speed from 2.53 ft/sec to > or equal to 2.9 ft/sec for improved community  mobility.   Baseline Target date 05/22/2015   Time 4   Period Weeks   PT SHORT TERM GOAL #4   Title The patient will ambulate x 300 ft level indoor surfaces without loss of balance to demo improving safety for household mobility.   Baseline Target date 05/22/2015   Time 4   Period Weeks           PT Long Term Goals - 04/23/15 1327    PT LONG TERM GOAL  #1   Title The patient will be able to perform post d/c HEP for continued mobility training.     Baseline Target date 06/21/2015   Time 8   Period Weeks   PT LONG TERM GOAL #2   Title The patient will improve Berg from 33/56 to > or equal to 42/56 to demo improved balance.   Baseline Target date 06/21/2015   Time 8   Period Weeks   PT LONG TERM GOAL #3   Title The patient will improve Berg from 15.95 seconds to < or equal to 14 seconds.   Baseline Target date 06/21/2015   Time 8   Period Weeks   PT LONG TERM GOAL #4   Title The patient will improve gait speed from 2.53 ft/sec to > or equal to 3.2 ft/sec to demo improving mobility.   Baseline Target date 06/21/2015   Time 8   Period Weeks   PT LONG TERM GOAL #5   Title The patient and her husband will be provided home safety checklist for fall prevention.   Baseline Target date 06/21/2015   Time 8   Period Weeks               Plan - 04/23/15 1335    Clinical Impression Statement The patient is a 80 yo female  known to our clinic from prior PT in 09/2014.  She has multifactorial fall risk including frequent falls, polypharmacy, gait instability, and memory changes.  PT to progress activity and instruct patient and her husband in home program to improve general mobility.   Pt will benefit from skilled therapeutic intervention in order to improve on the following deficits Abnormal gait;Decreased balance;Decreased mobility;Decreased strength;Difficulty walking;Postural dysfunction;Decreased activity tolerance   Rehab Potential Good   Clinical Impairments Affecting Rehab Potential memory impacting carryover to home   PT Frequency 2x / week   PT Duration 8 weeks   PT Treatment/Interventions ADLs/Self Care Home Management;Balance training;Neuromuscular re-education;Patient/family education;Gait training;Stair training;Therapeutic activities;Functional mobility training;Therapeutic exercise;Manual techniques   PT Next Visit Plan Teach  [place] HEP, balance training to tolerance.   Consulted and Agree with Plan of Care Patient         Problem List Patient Active Problem List   Diagnosis Date Noted  . Abnormality of gait 04/28/2013  . Neck pain 04/28/2013  . Temporal arteritis 10/01/2012    Class: Chronic  . Depression 09/14/2011    Class: Chronic  . Deep vein thrombosis 09/14/2011    Class: Diagnosis of  . Pulmonary embolism 09/14/2011    Class: Diagnosis of  . Stroke 09/14/2011    Class: Diagnosis of  . Seizures 09/14/2011    Class: Chronic  . Breast cancer 09/14/2011    Class: History of  . Memory loss 09/14/2011    Class: Chronic  . Head trauma 09/14/2011    Class: Acute  . ANEMIA OF CHRONIC DISEASE 02/24/2010  . ANEMIA-UNSPECIFIED 02/24/2010  . DYSPHAGIA 02/24/2010  . FECAL OCCULT BLOOD 02/24/2010  . PERSONAL HX COLONIC POLYPS 02/24/2010  . ADENOCARCINOMA, BREAST 10/03/2007  . ADENOMATOUS COLONIC POLYP 10/03/2007  . HYPERLIPIDEMIA 10/03/2007  . ANXIETY 10/03/2007  . HYPERTENSION 10/03/2007  . ESOPHAGEAL STRICTURE 10/03/2007  . GERD 10/03/2007  . PEPTIC STRICTURE 10/03/2007  . HIATAL HERNIA 10/03/2007  . DIVERTICULOSIS, COLON 10/03/2007  . OSTEOARTHRITIS 10/03/2007    WEAVER,CHRISTINA, PT 04/23/2015, 1:41 PM  [facility] [address] [place], [place], [address] Phone: [phone]   Fax:  [phone]

## 2015-04-26 ENCOUNTER — Ambulatory Visit: Payer: Medicare Other | Admitting: Physical Therapy

## 2015-04-26 DIAGNOSIS — R269 Unspecified abnormalities of gait and mobility: Secondary | ICD-10-CM

## 2015-04-26 NOTE — Therapy (Signed)
[facility] [address] [place], [place], [address] Phone: [phone]   Fax:  [phone]  Physical Therapy Treatment  Patient Details  Name: [name] MRN: [id number] Date of Birth: 1935/01/20 Referring Provider:  Marton Redwood, MD  Encounter Date: 04/26/2015      PT End of Session - 04/26/15 1916    Visit Number 2   Number of Visits 12   Date for PT Re-Evaluation 06/21/15   Authorization Type G code every 10th visit   PT Start Time 1537   PT Stop Time 1616   PT Time Calculation (min) 39 min   Activity Tolerance Patient tolerated treatment well   Behavior During Therapy [facility] for tasks assessed/performed      Past Medical History  Diagnosis Date  . Osteoporosis, senile   . Elevated cholesterol   . Cancer of breast, female     14  . History of blood clots     sees Dr. Wynonia Lawman  . Complication of anesthesia     "strong pain meds etc, causes dizziness"  . Hypertension     sees Dr. Marton Redwood  . Depression   . Anxiety   . Pneumonia     "bilaterally pneumonia, hospitalized 15 years ago"  . Stroke     hx of blood clots  . Urinary tract infection     hx of  . H/O hiatal hernia     "had surgery past hx"  . Headache(784.0)     "occas"  . Arthritis   . Anemia     hx of  . Irritable bowel syndrome     hx of  . Peptic ulcer disease     hx of  . Coronary artery disease     sees Dr. Wynonia Lawman  . Epilepsy     sees Dr. Erling Cruz  . Seizures   . Colon polyps 04/05/2010    Tubular adenomatous polyps  . GERD (gastroesophageal reflux disease) 04/05/2010  . Dizziness and giddiness   . Alzheimer disease   . Diverticulosis   . Esophageal stricture     Past Surgical History  Procedure Laterality Date  . Cholecystectomy, laparoscopic    . Rotator cuff repair Left   . Hernia repair    . Cholecystectomy    . Eye surgery      bilateral cataract surgery  . Surgery to uterus      "patient unsure of  term, states not fibroids"  . Breast surgery Left     lumpectomy  . Dilation and curettage of uterus    . Cardiac catheterization    . Artery biopsy Left 10/01/2012    Procedure: LEFT TEMPORAL ARTERY BIOPSY WITH ULTRASOUND PROBE;  Surgeon: Jerrell Belfast, MD;  Location: [facility];  Service: ENT;  Laterality: Left;  . Breast lumpectomy Right     x 2  . Nissen fundoplication    . Colonoscopy      There were no vitals filed for this visit.  Visit Diagnosis:  Abnormality of gait      Subjective Assessment - 04/26/15 1541    Subjective "It seems like I get most off-balance when I'm turning; that's how my last few falls have happened."   Patient Stated Goals Improve the way I move and balance.   Currently in Pain? No/denies  Balance Exercises - 04/26/15 1543    OTAGO PROGRAM   Head Movements Standing;5 reps   Neck Movements Standing;5 reps   Back Extension Standing;5 reps   Trunk Movements Standing;5 reps   Ankle Movements Sitting;10 reps   Knee Extensor 20 reps   Knee Flexor 10 reps   Hip ABductor 10 reps   Ankle Plantorflexors 20 reps, support   Ankle Dorsiflexors 20 reps, support   Knee Bends 10 reps, no support  15 reps   Backwards Walking No support  fingertip support   Walking and Turning Around No assistive device   Sideways Walking No assistive device   Overall OTAGO Comments Supervision/cueing required for techique           PT Education - 04/26/15 1916    Education provided Yes   Education Details [employer] initiated.   Person(s) Educated Patient   Methods Explanation;Demonstration;Verbal cues;Handout   Comprehension Verbalized understanding;Returned demonstration          PT Short Term Goals - 04/23/15 1324    PT SHORT TERM GOAL #1   Title The patient will perform HEP with family assistance for balance, strengthening, and flexibility to reduce pain + improve moiblity.     Baseline Target date  05/22/2015   Time 4   Period Weeks   PT SHORT TERM GOAL #2   Title The patient will improve Berg score from 33/56 up to 39/56 to demo decreasing risk for falls.     Baseline Target date 05/22/2015   Time 4   Period Weeks   PT SHORT TERM GOAL #3   Title The patient will improve gait speed from 2.53 ft/sec to > or equal to 2.9 ft/sec for improved community  mobility.   Baseline Target date 05/22/2015   Time 4   Period Weeks   PT SHORT TERM GOAL #4   Title The patient will ambulate x 300 ft level indoor surfaces without loss of balance to demo improving safety for household mobility.   Baseline Target date 05/22/2015   Time 4   Period Weeks           PT Long Term Goals - 04/23/15 1327    PT LONG TERM GOAL #1   Title The patient will be able to perform post d/c HEP for continued mobility training.     Baseline Target date 06/21/2015   Time 8   Period Weeks   PT LONG TERM GOAL #2   Title The patient will improve Berg from 33/56 to > or equal to 42/56 to demo improved balance.   Baseline Target date 06/21/2015   Time 8   Period Weeks   PT LONG TERM GOAL #3   Title The patient will improve Berg from 15.95 seconds to < or equal to 14 seconds.   Baseline Target date 06/21/2015   Time 8   Period Weeks   PT LONG TERM GOAL #4   Title The patient will improve gait speed from 2.53 ft/sec to > or equal to 3.2 ft/sec to demo improving mobility.   Baseline Target date 06/21/2015   Time 8   Period Weeks   PT LONG TERM GOAL #5   Title The patient and her husband will be provided home safety checklist for fall prevention.   Baseline Target date 06/21/2015   Time 8   Period Weeks               Plan - 04/26/15 1917    Clinical Impression  Statement Skilled session focused on initiating [place] to increase functional LE strengthening, standing balance. Pt tolerated session well. Continue per POC.   Pt will benefit from skilled therapeutic intervention in order to improve on the  following deficits Abnormal gait;Decreased balance;Decreased mobility;Decreased strength;Difficulty walking;Postural dysfunction;Decreased activity tolerance   Rehab Potential Good   Clinical Impairments Affecting Rehab Potential memory impacting carryover to home   PT Frequency 2x / week   PT Duration 8 weeks   PT Treatment/Interventions ADLs/Self Care Home Management;Balance training;Neuromuscular re-education;Patient/family education;Gait training;Stair training;Therapeutic activities;Functional mobility training;Therapeutic exercise;Manual techniques   PT Next Visit Plan [facility] teaching [place] HEP, balance training to tolerance.   Consulted and Agree with Plan of Care Patient        Problem List Patient Active Problem List   Diagnosis Date Noted  . Abnormality of gait 04/28/2013  . Neck pain 04/28/2013  . Temporal arteritis 10/01/2012    Class: Chronic  . Depression 09/14/2011    Class: Chronic  . Deep vein thrombosis 09/14/2011    Class: Diagnosis of  . Pulmonary embolism 09/14/2011    Class: Diagnosis of  . Stroke 09/14/2011    Class: Diagnosis of  . Seizures 09/14/2011    Class: Chronic  . Breast cancer 09/14/2011    Class: History of  . Memory loss 09/14/2011    Class: Chronic  . Head trauma 09/14/2011    Class: Acute  . ANEMIA OF CHRONIC DISEASE 02/24/2010  . ANEMIA-UNSPECIFIED 02/24/2010  . DYSPHAGIA 02/24/2010  . FECAL OCCULT BLOOD 02/24/2010  . PERSONAL HX COLONIC POLYPS 02/24/2010  . ADENOCARCINOMA, BREAST 10/03/2007  . ADENOMATOUS COLONIC POLYP 10/03/2007  . HYPERLIPIDEMIA 10/03/2007  . ANXIETY 10/03/2007  . HYPERTENSION 10/03/2007  . ESOPHAGEAL STRICTURE 10/03/2007  . GERD 10/03/2007  . PEPTIC STRICTURE 10/03/2007  . HIATAL HERNIA 10/03/2007  . DIVERTICULOSIS, COLON 10/03/2007  . OSTEOARTHRITIS 10/03/2007    Billie Ruddy, PT, DPT [facility] [address] [place], [place], [address] Phone: [phone]    Fax:  [phone] 04/26/2015, 7:21 PM

## 2015-04-27 ENCOUNTER — Ambulatory Visit: Payer: Medicare Other | Admitting: Physical Therapy

## 2015-04-27 VITALS — BP 130/75 | HR 48

## 2015-04-27 DIAGNOSIS — R269 Unspecified abnormalities of gait and mobility: Secondary | ICD-10-CM

## 2015-04-27 NOTE — Therapy (Signed)
[facility] [address] [place], [place], [address] Phone: [phone]   Fax:  [phone]  Physical Therapy Treatment  Patient Details  Name: [name] MRN: [id number] Date of Birth: [birth year] Referring Provider:  Marton Redwood, MD  Encounter Date: 04/27/2015      PT End of Session - 04/27/15 1936    Visit Number 3   Number of Visits 12   Date for PT Re-Evaluation 06/21/15   Authorization Type G code every 10th visit   PT Start Time 1101   PT Stop Time 1149   PT Time Calculation (min) 48 min   Activity Tolerance Patient tolerated treatment well   Behavior During Therapy --  Distracted; difficulty attending to task      Past Medical History  Diagnosis Date  . Osteoporosis, senile   . Elevated cholesterol   . Cancer of breast, female     88  . History of blood clots     sees Dr. Wynonia Lawman  . Complication of anesthesia     "strong pain meds etc, causes dizziness"  . Hypertension     sees Dr. Marton Redwood  . Depression   . Anxiety   . Pneumonia     "bilaterally pneumonia, hospitalized 15 years ago"  . Stroke     hx of blood clots  . Urinary tract infection     hx of  . H/O hiatal hernia     "had surgery past hx"  . Headache(784.0)     "occas"  . Arthritis   . Anemia     hx of  . Irritable bowel syndrome     hx of  . Peptic ulcer disease     hx of  . Coronary artery disease     sees Dr. Wynonia Lawman  . Epilepsy     sees Dr. Erling Cruz  . Seizures   . Colon polyps 04/05/2010    Tubular adenomatous polyps  . GERD (gastroesophageal reflux disease) 04/05/2010  . Dizziness and giddiness   . Alzheimer disease   . Diverticulosis   . Esophageal stricture     Past Surgical History  Procedure Laterality Date  . Cholecystectomy, laparoscopic    . Rotator cuff repair Left   . Hernia repair    . Cholecystectomy    . Eye surgery      bilateral cataract surgery  . Surgery to uterus      "patient  unsure of term, states not fibroids"  . Breast surgery Left     lumpectomy  . Dilation and curettage of uterus    . Cardiac catheterization    . Artery biopsy Left 10/01/2012    Procedure: LEFT TEMPORAL ARTERY BIOPSY WITH ULTRASOUND PROBE;  Surgeon: Jerrell Belfast, MD;  Location: [facility];  Service: ENT;  Laterality: Left;  . Breast lumpectomy Right     x 2  . Nissen fundoplication    . Colonoscopy      Filed Vitals:   04/27/15 1143  BP: 130/75  Pulse: 48    Visit Diagnosis:  Abnormality of gait      Subjective Assessment - 04/27/15 1105    Subjective Pt noted to frequently start talking, then stop mid-sentence and forget train of thought today.  Pt has not taken medication yet this AM, as she normally takes meds after eating. Pt denies other physical changes/symptoms and does not perceive change in thinking/communication. When asked, husband reports no change in pt behavior/presentation today. Pt denies falls.  Patient Stated Goals Improve the way I move and balance.   Currently in Pain? No/denies                         [facility] Adult PT Treatment/Exercise - 04/27/15 0001    Ambulation/Gait   Ambulation/Gait Yes   Ambulation/Gait Assistance 5: Supervision;4: Min guard   Ambulation/Gait Assistance Details Frequent LOB to L side with effective self-recovery   Ambulation Distance (Feet) 230 Feet   Assistive device Other (Comment)   Gait Pattern Decreased stride length;Shuffle   Ambulation Surface Level;Indoor   Gait velocity --   Stairs Yes   Stairs Assistance 5: Supervision;4: Min guard  min guard due to posterior preference on stairs   Stair Management Technique Alternating pattern;One rail Left   Number of Stairs 4   Posture/Postural Control   Posture/Postural Control --   Postural Limitations --             Balance Exercises - 04/27/15 1107    OTAGO PROGRAM   Tandem Stance 10 seconds, support  2-finger support   Tandem Walk Support  3-finger  support   One Leg Stand 10 seconds, support  3-finger support   Heel Walking Support   Toe Walk Support   Heel Toe Walking Backward --  not added to HEP due to safety concerns   Sit to Stand 10 reps, no support   Stair Walking 4  not added to HEP due to safety concerns   Overall OTAGO Comments Supervision, cueing for safe/proper technique with all exercises. 4 steps with 1 rails and min guard due to posterior lean.           PT Education - 04/27/15 1931    Education provided Yes   Education Details Completed instruction of [employer]. Recommended to pt/husband that if pt demonstrates any significant changes (for example, altered mental status) or reports feeling different/unwell, pt/husband should seek immediate medical attention.    Person(s) Educated Patient;Spouse   Methods Explanation   Comprehension Verbalized understanding          PT Short Term Goals - 04/23/15 1324    PT SHORT TERM GOAL #1   Title The patient will perform HEP with family assistance for balance, strengthening, and flexibility to reduce pain + improve moiblity.     Baseline Target date 05/22/2015   Time 4   Period Weeks   PT SHORT TERM GOAL #2   Title The patient will improve Berg score from 33/56 up to 39/56 to demo decreasing risk for falls.     Baseline Target date 05/22/2015   Time 4   Period Weeks   PT SHORT TERM GOAL #3   Title The patient will improve gait speed from 2.53 ft/sec to > or equal to 2.9 ft/sec for improved community  mobility.   Baseline Target date 05/22/2015   Time 4   Period Weeks   PT SHORT TERM GOAL #4   Title The patient will ambulate x 300 ft level indoor surfaces without loss of balance to demo improving safety for household mobility.   Baseline Target date 05/22/2015   Time 4   Period Weeks           PT Long Term Goals - 04/23/15 1327    PT LONG TERM GOAL #1   Title The patient will be able to perform post d/c HEP for continued mobility training.     Baseline  Target date 06/21/2015  Time 8   Period Weeks   PT LONG TERM GOAL #2   Title The patient will improve Berg from 33/56 to > or equal to 42/56 to demo improved balance.   Baseline Target date 06/21/2015   Time 8   Period Weeks   PT LONG TERM GOAL #3   Title The patient will improve Berg from 15.95 seconds to < or equal to 14 seconds.   Baseline Target date 06/21/2015   Time 8   Period Weeks   PT LONG TERM GOAL #4   Title The patient will improve gait speed from 2.53 ft/sec to > or equal to 3.2 ft/sec to demo improving mobility.   Baseline Target date 06/21/2015   Time 8   Period Weeks   PT LONG TERM GOAL #5   Title The patient and her husband will be provided home safety checklist for fall prevention.   Baseline Target date 06/21/2015   Time 8   Period Weeks               Plan - 04/27/15 1938    Clinical Impression Statement Skilled session focused on completing instruction of [place]. Pt presentation different during current session as compared with session yesterday. During this session, pt demonstrated difficulty attending to functional mobility tasks, exhibited increased gait instability, and frequently stopped speaking mid-sentence and was unable to recall train of thought. Therapy gym was crowded and loud today as compared with empty, quiet treatment space yesterday. However, even when brought into quiet treatment room today, pt presentation grossly unchanged. Vital signs grossly WNL, with exception of HR 48 bpm. Pt denies other symptoms. Spoke with both pt and husband about change in pt presentation, low HR today; recommended pt/husband seek immediate medical attention if any symptoms arise or pt presentation/mental status changes. Pt/husband verbalized understanding. Continue per POC.   Pt will benefit from skilled therapeutic intervention in order to improve on the following deficits Abnormal gait;Decreased balance;Decreased mobility;Decreased strength;Difficulty  walking;Postural dysfunction;Decreased activity tolerance   Rehab Potential Good   Clinical Impairments Affecting Rehab Potential memory impacting carryover to home   PT Frequency 2x / week   PT Duration 8 weeks   PT Treatment/Interventions ADLs/Self Care Home Management;Balance training;Neuromuscular re-education;Patient/family education;Gait training;Stair training;Therapeutic activities;Functional mobility training;Therapeutic exercise;Manual techniques   PT Next Visit Plan Check on carryover of [place]. If pt unable to safely/effectively perform, suggest husband or other family member accompany pt to PT to promote carryover.   Consulted and Agree with Plan of Care Patient;Family member/caregiver   Family Member Consulted husband, [place] Side        Problem List Patient Active Problem List   Diagnosis Date Noted  . Abnormality of gait 04/28/2013  . Neck pain 04/28/2013  . Temporal arteritis 10/01/2012    Class: Chronic  . Depression 09/14/2011    Class: Chronic  . Deep vein thrombosis 09/14/2011    Class: Diagnosis of  . Pulmonary embolism 09/14/2011    Class: Diagnosis of  . Stroke 09/14/2011    Class: Diagnosis of  . Seizures 09/14/2011    Class: Chronic  . Breast cancer 09/14/2011    Class: History of  . Memory loss 09/14/2011    Class: Chronic  . Head trauma 09/14/2011    Class: Acute  . ANEMIA OF CHRONIC DISEASE 02/24/2010  . ANEMIA-UNSPECIFIED 02/24/2010  . DYSPHAGIA 02/24/2010  . FECAL OCCULT BLOOD 02/24/2010  . PERSONAL HX COLONIC POLYPS 02/24/2010  . ADENOCARCINOMA, BREAST 10/03/2007  . ADENOMATOUS COLONIC POLYP 10/03/2007  .  HYPERLIPIDEMIA 10/03/2007  . ANXIETY 10/03/2007  . HYPERTENSION 10/03/2007  . ESOPHAGEAL STRICTURE 10/03/2007  . GERD 10/03/2007  . PEPTIC STRICTURE 10/03/2007  . HIATAL HERNIA 10/03/2007  . DIVERTICULOSIS, COLON 10/03/2007  . OSTEOARTHRITIS 10/03/2007    Billie Ruddy, PT, DPT [facility] [address] [place], [place], [address] Phone: [phone]   Fax:  [phone] 04/27/2015, 7:46 PM

## 2015-05-04 ENCOUNTER — Ambulatory Visit: Payer: Medicare Other | Admitting: Rehabilitative and Restorative Service Providers"

## 2015-05-04 DIAGNOSIS — R269 Unspecified abnormalities of gait and mobility: Secondary | ICD-10-CM | POA: Diagnosis not present

## 2015-05-04 NOTE — Therapy (Signed)
[facility] [address] [place], [place], [address] Phone: [phone]   Fax:  [phone]  Physical Therapy Treatment  Patient Details  Name: [name] MRN: [id number] Date of Birth: 1935/03/02 Referring Alfrieda Tarry:  Marton Redwood, MD  Encounter Date: 05/04/2015      PT End of Session - 05/04/15 1141    Visit Number 4   Number of Visits 12   Date for PT Re-Evaluation 06/21/15   Authorization Type G code every 10th visit   PT Start Time 1106   PT Stop Time 1150   PT Time Calculation (min) 44 min   Activity Tolerance Patient tolerated treatment well   Behavior During Therapy --  Distracted; difficulty attending to task      Past Medical History  Diagnosis Date  . Osteoporosis, senile   . Elevated cholesterol   . Cancer of breast, female     49  . History of blood clots     sees Dr. Wynonia Lawman  . Complication of anesthesia     "strong pain meds etc, causes dizziness"  . Hypertension     sees Dr. Marton Redwood  . Depression   . Anxiety   . Pneumonia     "bilaterally pneumonia, hospitalized 15 years ago"  . Stroke     hx of blood clots  . Urinary tract infection     hx of  . H/O hiatal hernia     "had surgery past hx"  . Headache(784.0)     "occas"  . Arthritis   . Anemia     hx of  . Irritable bowel syndrome     hx of  . Peptic ulcer disease     hx of  . Coronary artery disease     sees Dr. Wynonia Lawman  . Epilepsy     sees Dr. Erling Cruz  . Seizures   . Colon polyps 04/05/2010    Tubular adenomatous polyps  . GERD (gastroesophageal reflux disease) 04/05/2010  . Dizziness and giddiness   . Alzheimer disease   . Diverticulosis   . Esophageal stricture     Past Surgical History  Procedure Laterality Date  . Cholecystectomy, laparoscopic    . Rotator cuff repair Left   . Hernia repair    . Cholecystectomy    . Eye surgery      bilateral cataract surgery  . Surgery to uterus      "patient  unsure of term, states not fibroids"  . Breast surgery Left     lumpectomy  . Dilation and curettage of uterus    . Cardiac catheterization    . Artery biopsy Left 10/01/2012    Procedure: LEFT TEMPORAL ARTERY BIOPSY WITH ULTRASOUND PROBE;  Surgeon: Jerrell Belfast, MD;  Location: [facility];  Service: ENT;  Laterality: Left;  . Breast lumpectomy Right     x 2  . Nissen fundoplication    . Colonoscopy      There were no vitals filed for this visit.  Visit Diagnosis:  Abnormality of gait      Subjective Assessment - 05/04/15 1107    Subjective The patient reports imbalance but no falls.  The patient did not remember her [place] home program folder--she did have a chance to try exercises at home.   Patient Stated Goals Improve the way I move and balance.   Currently in Pain? No/denies            [facility] Adult PT Treatment/Exercise - 05/04/15  1129    Ambulation/Gait   Ambulation/Gait Yes   Ambulation/Gait Assistance 5: Supervision   Ambulation Distance (Feet) 345 Feet  x 2 reps    Gait Pattern Decreased stride length   Ambulation Surface Level   Gait Comments dynamic gait activities forward/backward walking with min A to prevent posterior loss of balance   Neuro Re-ed    Neuro Re-ed Details  STanding weight shifting anterior/posterior with min A, attempted stepping posterior to midline on ramp, however patient required mod A, therefore switched to level surface.  Alteranting LE foot taps to 12" surfaces and step ups R/L x 10 each decreasing UE support; sidestepping R/L with min A.   Exercises   Exercises Other Exercises   Other Exercises  Sit<>stand with overhead ball reaching x 10 reps with CGA, physioball sitting with  marches and LE extension with min A for safety and verbal cues for core stabilization           PT Short Term Goals - 04/23/15 1324    PT SHORT TERM GOAL #1   Title The patient will perform HEP with family assistance for balance, strengthening, and flexibility  to reduce pain + improve moiblity.     Baseline Target date 05/22/2015   Time 4   Period Weeks   PT SHORT TERM GOAL #2   Title The patient will improve Berg score from 33/56 up to 39/56 to demo decreasing risk for falls.     Baseline Target date 05/22/2015   Time 4   Period Weeks   PT SHORT TERM GOAL #3   Title The patient will improve gait speed from 2.53 ft/sec to > or equal to 2.9 ft/sec for improved community  mobility.   Baseline Target date 05/22/2015   Time 4   Period Weeks   PT SHORT TERM GOAL #4   Title The patient will ambulate x 300 ft level indoor surfaces without loss of balance to demo improving safety for household mobility.   Baseline Target date 05/22/2015   Time 4   Period Weeks           PT Long Term Goals - 04/23/15 1327    PT LONG TERM GOAL #1   Title The patient will be able to perform post d/c HEP for continued mobility training.     Baseline Target date 06/21/2015   Time 8   Period Weeks   PT LONG TERM GOAL #2   Title The patient will improve Berg from 33/56 to > or equal to 42/56 to demo improved balance.   Baseline Target date 06/21/2015   Time 8   Period Weeks   PT LONG TERM GOAL #3   Title The patient will improve Berg from 15.95 seconds to < or equal to 14 seconds.   Baseline Target date 06/21/2015   Time 8   Period Weeks   PT LONG TERM GOAL #4   Title The patient will improve gait speed from 2.53 ft/sec to > or equal to 3.2 ft/sec to demo improving mobility.   Baseline Target date 06/21/2015   Time 8   Period Weeks   PT LONG TERM GOAL #5   Title The patient and her husband will be provided home safety checklist for fall prevention.   Baseline Target date 06/21/2015   Time 8   Period Weeks               Plan - 05/04/15 1433    Clinical Impression Statement The patient  has a harder time focusing in busy environment and stopped midway through sentences at times due to distractability. She declines wanting her husband to  participate in session to review HEP and reports she does not want help at home with home exercise program.  This may limit her ability to carryover tasks to the home environment.   PT Next Visit Plan Check on carryover of [place]. If pt unable to safely/effectively perform, suggest husband or other family member accompany pt to PT to promote carryover.   Consulted and Agree with Plan of Care Patient        Problem List Patient Active Problem List   Diagnosis Date Noted  . Abnormality of gait 04/28/2013  . Neck pain 04/28/2013  . Temporal arteritis 10/01/2012    Class: Chronic  . Depression 09/14/2011    Class: Chronic  . Deep vein thrombosis 09/14/2011    Class: Diagnosis of  . Pulmonary embolism 09/14/2011    Class: Diagnosis of  . Stroke 09/14/2011    Class: Diagnosis of  . Seizures 09/14/2011    Class: Chronic  . Breast cancer 09/14/2011    Class: History of  . Memory loss 09/14/2011    Class: Chronic  . Head trauma 09/14/2011    Class: Acute  . ANEMIA OF CHRONIC DISEASE 02/24/2010  . ANEMIA-UNSPECIFIED 02/24/2010  . DYSPHAGIA 02/24/2010  . FECAL OCCULT BLOOD 02/24/2010  . PERSONAL HX COLONIC POLYPS 02/24/2010  . ADENOCARCINOMA, BREAST 10/03/2007  . ADENOMATOUS COLONIC POLYP 10/03/2007  . HYPERLIPIDEMIA 10/03/2007  . ANXIETY 10/03/2007  . HYPERTENSION 10/03/2007  . ESOPHAGEAL STRICTURE 10/03/2007  . GERD 10/03/2007  . PEPTIC STRICTURE 10/03/2007  . HIATAL HERNIA 10/03/2007  . DIVERTICULOSIS, COLON 10/03/2007  . OSTEOARTHRITIS 10/03/2007    WEAVER,CHRISTINA, PT 05/04/2015, 2:36 PM  [facility] [address] [place], [place], [address] Phone: [phone]   Fax:  [phone]

## 2015-05-07 ENCOUNTER — Ambulatory Visit: Payer: Medicare Other | Admitting: Rehabilitative and Restorative Service Providers"

## 2015-05-10 ENCOUNTER — Ambulatory Visit: Payer: Medicare Other | Attending: Internal Medicine | Admitting: Rehabilitative and Restorative Service Providers"

## 2015-05-10 DIAGNOSIS — R269 Unspecified abnormalities of gait and mobility: Secondary | ICD-10-CM | POA: Diagnosis not present

## 2015-05-10 NOTE — Patient Instructions (Signed)

## 2015-05-10 NOTE — Therapy (Signed)
[facility] [address] [place], [place], [address] Phone: [phone]   Fax:  [phone]  Physical Therapy Treatment  Patient Details  Name: [name] MRN: [id number] Date of Birth: [birth year] Referring Provider:  Marton Redwood, MD  Encounter Date: 05/10/2015      PT End of Session - 05/10/15 1254    Visit Number 5   Number of Visits 12   Date for PT Re-Evaluation 06/21/15   Authorization Type G code every 10th visit   PT Start Time 1238   PT Stop Time 1320   PT Time Calculation (min) 42 min   Equipment Utilized During Treatment Gait belt   Activity Tolerance Patient tolerated treatment well   Behavior During Therapy --  Distracted; difficulty attending to task      Past Medical History  Diagnosis Date  . Osteoporosis, senile   . Elevated cholesterol   . Cancer of breast, female     57  . History of blood clots     sees Dr. Wynonia Lawman  . Complication of anesthesia     "strong pain meds etc, causes dizziness"  . Hypertension     sees Dr. Marton Redwood  . Depression   . Anxiety   . Pneumonia     "bilaterally pneumonia, hospitalized 15 years ago"  . Stroke     hx of blood clots  . Urinary tract infection     hx of  . H/O hiatal hernia     "had surgery past hx"  . Headache(784.0)     "occas"  . Arthritis   . Anemia     hx of  . Irritable bowel syndrome     hx of  . Peptic ulcer disease     hx of  . Coronary artery disease     sees Dr. Wynonia Lawman  . Epilepsy     sees Dr. Erling Cruz  . Seizures   . Colon polyps 04/05/2010    Tubular adenomatous polyps  . GERD (gastroesophageal reflux disease) 04/05/2010  . Dizziness and giddiness   . Alzheimer disease   . Diverticulosis   . Esophageal stricture     Past Surgical History  Procedure Laterality Date  . Cholecystectomy, laparoscopic    . Rotator cuff repair Left   . Hernia repair    . Cholecystectomy    . Eye surgery      bilateral  cataract surgery  . Surgery to uterus      "patient unsure of term, states not fibroids"  . Breast surgery Left     lumpectomy  . Dilation and curettage of uterus    . Cardiac catheterization    . Artery biopsy Left 10/01/2012    Procedure: LEFT TEMPORAL ARTERY BIOPSY WITH ULTRASOUND PROBE;  Surgeon: Jerrell Belfast, MD;  Location: [facility];  Service: ENT;  Laterality: Left;  . Breast lumpectomy Right     x 2  . Nissen fundoplication    . Colonoscopy      There were no vitals filed for this visit.  Visit Diagnosis:  Abnormality of gait      Subjective Assessment - 05/10/15 1239    Subjective The patient reports she tried her exercises at home reporting "they go better at home".  She reports she forgot her folder for exercises today.     Patient Stated Goals Improve the way I move and balance.   Currently in Pain? Yes   Pain Score --  "may be sinuses",  unable to rate   Pain Location Head   Pain Descriptors / Indicators Sharp   Pain Type Chronic pain   Pain Onset More than a month ago   Pain Frequency Intermittent   Aggravating Factors  unsure   Pain Relieving Factors unsure            [facility] PT Assessment - 05/10/15 1245    Ambulation/Gait   Ambulation/Gait Yes   Gait velocity 3.2 ft/sec   Standardized Balance Assessment   Standardized Balance Assessment Berg Balance Test   Berg Balance Test   Sit to Stand Able to stand without using hands and stabilize independently   Standing Unsupported Able to stand safely 2 minutes   Sitting with Back Unsupported but Feet Supported on Floor or Stool Able to sit safely and securely 2 minutes   Stand to Sit Sits safely with minimal use of hands   Transfers Able to transfer safely, minor use of hands   Standing Unsupported with Eyes Closed Able to stand 10 seconds safely   Standing Ubsupported with Feet Together Able to place feet together independently and stand for 1 minute with supervision   From Standing, Reach Forward with  Outstretched Arm Can reach forward >12 cm safely (5")   From Standing Position, Pick up Object from Floor Able to pick up shoe, needs supervision   From Standing Position, Turn to Look Behind Over each Shoulder Turn sideways only but maintains balance   Turn 360 Degrees Able to turn 360 degrees safely in 4 seconds or less   Standing Unsupported, Alternately Place Feet on Step/Stool Able to complete 4 steps without aid or supervision   Standing Unsupported, One Foot in [place] to take small step independently and hold 30 seconds   Standing on One Leg Tries to lift leg/unable to hold 3 seconds but remains standing independently   Total Score 44   Berg comment: 44/56 improved from 33/56 at initial evaluation              [facility] Adult PT Treatment/Exercise - 05/10/15 1245    Ambulation/Gait   Ambulation/Gait Assistance 5: Supervision   Ambulation Distance (Feet) 345 Feet  indoors followed by 500+ feet community surfaces   Gait Pattern Decreased stride length   Ambulation Surface Level;Indoor;Outdoor   Gait Comments patient tolerated short community distances well with supervision Then 300 ft x 3 times with dynamic gait activities with quick starts/stops, direction changes, turns and backwards walking with SBA to CGA for safety   Self-Care   Self-Care Other Self-Care Comments   Other Self-Care Comments  discussed fall prevention.  Patient reports removing some throw rugs, using night lights when out of bed.  Provided other fall prevention tips via handout     TUG=12.53 seconds without a device independently.             PT Short Term Goals - 05/10/15 1244    PT SHORT TERM GOAL #1   Title The patient will perform HEP with family assistance for balance, strengthening, and flexibility to reduce pain + improve moiblity.     Baseline Target date 05/22/2015   Time 4   Period Weeks   Status On-going   PT SHORT TERM GOAL #2   Title The patient will improve Berg score from 33/56  up to 39/56 to demo decreasing risk for falls.     Baseline Patient met goal improving to 44/56.    Time 4   Period Weeks   Status Achieved  PT SHORT TERM GOAL #3   Title The patient will improve gait speed from 2.53 ft/sec to > or equal to 2.9 ft/sec for improved community  mobility.   Baseline Patient met goal scoring 3.2 ft/sec-met on 05/10/2015   Time 4   Period Weeks   Status Achieved   PT SHORT TERM GOAL #4   Title The patient will ambulate x 300 ft level indoor surfaces without loss of balance to demo improving safety for household mobility.   Baseline Met on 05/10/2015.   Time 4   Period Weeks   Status Achieved           PT Long Term Goals - 05/10/15 1256    PT LONG TERM GOAL #1   Title The patient will be able to perform post d/c HEP for continued mobility training.     Baseline Target date 06/21/2015   Time 8   Period Weeks   Status On-going   PT LONG TERM GOAL #2   Title The patient will improve Berg from 33/56 to > or equal to 42/56 to demo improved balance.   Baseline Met on 05/10/2015 scoring 44/56.   Time 8   Period Weeks   Status Achieved   PT LONG TERM GOAL #3   Title The patient will improve TUG from 15.95 seconds to < or equal to 14 seconds.   Baseline Met scoring 12.53 seconds on 05/10/2015   Time 8   Period Weeks   Status Achieved   PT LONG TERM GOAL #4   Title The patient will improve gait speed from 2.53 ft/sec to > or equal to 3.2 ft/sec to demo improving mobility.   Baseline Met on 05/10/2015 with 3.2 ft/sec gait speed   Time 8   Period Weeks   Status Achieved   PT LONG TERM GOAL #5   Title The patient and her husband will be provided home safety checklist for fall prevention.   Baseline Target date 06/21/2015   Time 8   Period Weeks   Status Achieved               Plan - 05/10/15 1257    Clinical Impression Statement The patient met 3 STGs and 4 LTGs.  She reports she is performing HEP regularly per [facility] written program.  PT to  f/u for 1 more visit and re-assess [facility] and discharge if patient progressing well.   Clinical Impairments Affecting Rehab Potential memory impacting carryover to home   PT Next Visit Plan Check on carryover of [place], review and discharge.   Consulted and Agree with Plan of Care Patient        Problem List Patient Active Problem List   Diagnosis Date Noted  . Abnormality of gait 04/28/2013  . Neck pain 04/28/2013  . Temporal arteritis ([place]) 10/01/2012    Class: Chronic  . Depression 09/14/2011    Class: Chronic  . Deep vein thrombosis ([place]) 09/14/2011    Class: Diagnosis of  . Pulmonary embolism ([facility]) 09/14/2011    Class: Diagnosis of  . Stroke ([place]) 09/14/2011    Class: Diagnosis of  . Seizures ([place]) 09/14/2011    Class: Chronic  . Breast cancer ([facility]) 09/14/2011    Class: History of  . Memory loss 09/14/2011    Class: Chronic  . Head trauma 09/14/2011    Class: Acute  . ANEMIA OF CHRONIC DISEASE 02/24/2010  . ANEMIA-UNSPECIFIED 02/24/2010  . DYSPHAGIA 02/24/2010  . FECAL OCCULT BLOOD 02/24/2010  .  PERSONAL HX COLONIC POLYPS 02/24/2010  . ADENOCARCINOMA, BREAST 10/03/2007  . ADENOMATOUS COLONIC POLYP 10/03/2007  . HYPERLIPIDEMIA 10/03/2007  . ANXIETY 10/03/2007  . HYPERTENSION 10/03/2007  . ESOPHAGEAL STRICTURE 10/03/2007  . GERD 10/03/2007  . PEPTIC STRICTURE 10/03/2007  . HIATAL HERNIA 10/03/2007  . DIVERTICULOSIS, COLON 10/03/2007  . OSTEOARTHRITIS 10/03/2007    Eliberto Sole, PT 05/10/2015, 1:52 PM  [facility] [address] [place], [place], [address] Phone: [phone]   Fax:  [phone]

## 2015-05-11 DIAGNOSIS — I2510 Atherosclerotic heart disease of native coronary artery without angina pectoris: Secondary | ICD-10-CM | POA: Diagnosis not present

## 2015-05-11 DIAGNOSIS — G309 Alzheimer's disease, unspecified: Secondary | ICD-10-CM | POA: Diagnosis not present

## 2015-05-11 DIAGNOSIS — Z7901 Long term (current) use of anticoagulants: Secondary | ICD-10-CM | POA: Diagnosis not present

## 2015-05-11 DIAGNOSIS — D649 Anemia, unspecified: Secondary | ICD-10-CM | POA: Diagnosis not present

## 2015-05-11 DIAGNOSIS — E785 Hyperlipidemia, unspecified: Secondary | ICD-10-CM | POA: Diagnosis not present

## 2015-05-11 DIAGNOSIS — I8290 Acute embolism and thrombosis of unspecified vein: Secondary | ICD-10-CM | POA: Diagnosis not present

## 2015-05-11 DIAGNOSIS — Z23 Encounter for immunization: Secondary | ICD-10-CM | POA: Diagnosis not present

## 2015-05-11 DIAGNOSIS — R7301 Impaired fasting glucose: Secondary | ICD-10-CM | POA: Diagnosis not present

## 2015-05-11 DIAGNOSIS — I10 Essential (primary) hypertension: Secondary | ICD-10-CM | POA: Diagnosis not present

## 2015-05-13 ENCOUNTER — Ambulatory Visit: Payer: Medicare Other | Admitting: Rehabilitative and Restorative Service Providers"

## 2015-05-19 ENCOUNTER — Ambulatory Visit: Payer: Medicare Other | Admitting: Rehabilitative and Restorative Service Providers"

## 2015-05-19 DIAGNOSIS — R269 Unspecified abnormalities of gait and mobility: Secondary | ICD-10-CM

## 2015-05-19 NOTE — Therapy (Signed)
[facility] [address] [place], [place], [address] Phone: [phone]   Fax:  [phone]  Physical Therapy Treatment  Patient Details  Name: [name] MRN: [id number] Date of Birth: 02-May-1935 Referring Provider:  Marton Redwood, MD  Encounter Date: 05/19/2015      PT End of Session - 05/19/15 1339    Visit Number 6   Number of Visits 12   Date for PT Re-Evaluation 06/21/15   Authorization Type G code every 10th visit   PT Start Time 1240   PT Stop Time 1320   PT Time Calculation (min) 40 min   Equipment Utilized During Treatment Gait belt   Activity Tolerance Patient tolerated treatment well   Behavior During Therapy --  Distracted; difficulty attending to task      Past Medical History  Diagnosis Date  . Osteoporosis, senile   . Elevated cholesterol   . Cancer of breast, female     38  . History of blood clots     sees Dr. Wynonia Lawman  . Complication of anesthesia     "strong pain meds etc, causes dizziness"  . Hypertension     sees Dr. Marton Redwood  . Depression   . Anxiety   . Pneumonia     "bilaterally pneumonia, hospitalized 15 years ago"  . Stroke     hx of blood clots  . Urinary tract infection     hx of  . H/O hiatal hernia     "had surgery past hx"  . Headache(784.0)     "occas"  . Arthritis   . Anemia     hx of  . Irritable bowel syndrome     hx of  . Peptic ulcer disease     hx of  . Coronary artery disease     sees Dr. Wynonia Lawman  . Epilepsy     sees Dr. Erling Cruz  . Seizures   . Colon polyps 04/05/2010    Tubular adenomatous polyps  . GERD (gastroesophageal reflux disease) 04/05/2010  . Dizziness and giddiness   . Alzheimer disease   . Diverticulosis   . Esophageal stricture     Past Surgical History  Procedure Laterality Date  . Cholecystectomy, laparoscopic    . Rotator cuff repair Left   . Hernia repair    . Cholecystectomy    . Eye surgery      bilateral  cataract surgery  . Surgery to uterus      "patient unsure of term, states not fibroids"  . Breast surgery Left     lumpectomy  . Dilation and curettage of uterus    . Cardiac catheterization    . Artery biopsy Left 10/01/2012    Procedure: LEFT TEMPORAL ARTERY BIOPSY WITH ULTRASOUND PROBE;  Surgeon: Jerrell Belfast, MD;  Location: [facility];  Service: ENT;  Laterality: Left;  . Breast lumpectomy Right     x 2  . Nissen fundoplication    . Colonoscopy      There were no vitals filed for this visit.  Visit Diagnosis:  Abnormality of gait      Subjective Assessment - 05/19/15 1241    Subjective The patient is doing her home exercise program and feels like she is improving with her mobility.   Patient Stated Goals Improve the way I move and balance.   Currently in Pain? Yes   Pain Score --  "a little bit"   Pain Location Knee   Pain Orientation Left  Pain Type Chronic pain   Pain Onset More than a month ago   Pain Frequency Intermittent   Aggravating Factors  uncertain   Pain Relieving Factors uncertain     SELF CARE/HOME MANAGEMENT: Discussed continuing HEP after discharge from PT and reviewed goals. Discussed home fall prevention for post d/c safety.      Balance Exercises - 05/19/15 1304    OTAGO PROGRAM   Head Movements Standing;5 reps   Neck Movements Standing;5 reps   Back Extension --  d/c this for home due to back pain   Trunk Movements Standing;5 reps   Ankle Movements Sitting;10 reps   Knee Extensor 10 reps   Knee Flexor 10 reps;Weight (comment)  2 lbs   Hip ABductor 10 reps;Weight (comment)  2 lb   Ankle Plantorflexors 20 reps, support   Ankle Dorsiflexors 20 reps, support   Knee Bends 10 reps, no support   Backwards Walking No support   Walking and Turning Around No assistive device   Sideways Walking No assistive device   Tandem Stance 10 seconds, support   Tandem Walk Support   One Leg Stand 10 seconds, support   Heel Walking Support   Toe Walk  Support   Sit to Stand 10 reps, no support             PT Short Term Goals - 05/19/15 1315    PT SHORT TERM GOAL #1   Title The patient will perform HEP with family assistance for balance, strengthening, and flexibility to reduce pain + improve moiblity.     Baseline Met on 05/19/2015   Time 4   Period Weeks   Status Achieved   PT SHORT TERM GOAL #2   Title The patient will improve Berg score from 33/56 up to 39/56 to demo decreasing risk for falls.     Baseline Patient met goal improving to 44/56.    Time 4   Period Weeks   Status Achieved   PT SHORT TERM GOAL #3   Title The patient will improve gait speed from 2.53 ft/sec to > or equal to 2.9 ft/sec for improved community  mobility.   Baseline Patient met goal scoring 3.2 ft/sec-met on 05/10/2015   Time 4   Period Weeks   Status Achieved   PT SHORT TERM GOAL #4   Title The patient will ambulate x 300 ft level indoor surfaces without loss of balance to demo improving safety for household mobility.   Baseline Met on 05/10/2015.   Time 4   Period Weeks   Status Achieved           PT Long Term Goals - 05/19/15 1315    PT LONG TERM GOAL #1   Title The patient will be able to perform post d/c HEP for continued mobility training.     Baseline Met on 05/19/2015   Time 8   Period Weeks   Status Achieved   PT LONG TERM GOAL #2   Title The patient will improve Berg from 33/56 to > or equal to 42/56 to demo improved balance.   Baseline Met on 05/10/2015 scoring 44/56.   Time 8   Period Weeks   Status Achieved   PT LONG TERM GOAL #3   Title The patient will improve TUG from 15.95 seconds to < or equal to 14 seconds.   Baseline Met scoring 12.53 seconds on 05/10/2015   Time 8   Period Weeks   Status Achieved   PT LONG  TERM GOAL #4   Title The patient will improve gait speed from 2.53 ft/sec to > or equal to 3.2 ft/sec to demo improving mobility.   Baseline Met on 05/10/2015 with 3.2 ft/sec gait speed   Time 8   Period  Weeks   Status Achieved   PT LONG TERM GOAL #5   Title The patient and her husband will be provided home safety checklist for fall prevention.   Baseline Target date 06/21/2015   Time 8   Period Weeks   Status Achieved               Plan - Jun 14, 2015 1340    Clinical Impression Statement The patient met all STGs and LTGS.  PT checked HEP and patient is able to perform with written instructions.  Patient d/c from PT.  She has shown improvement in her balance, however may continue with some fall risk due to declining cognition.   PT Next Visit Plan discharge from PT   Consulted and Agree with Plan of Care Patient          G-Codes - June 14, 2015 1344    Functional Assessment Tool Used Berg=44/56, gait speed=3.2 ft/sec and TUG=12.53 seconds   Functional Limitation Mobility: Walking and moving around   Mobility: Walking and Moving Around Goal Status [phone]) At least 20 percent but less than 40 percent impaired, limited or restricted   Mobility: Walking and Moving Around Discharge Status [phone]) At least 20 percent but less than 40 percent impaired, limited or restricted      Problem List Patient Active Problem List   Diagnosis Date Noted  . Abnormality of gait 04/28/2013  . Neck pain 04/28/2013  . Temporal arteritis ([place]) 10/01/2012    Class: Chronic  . Depression 09/14/2011    Class: Chronic  . Deep vein thrombosis ([place]) 09/14/2011    Class: Diagnosis of  . Pulmonary embolism ([place]) 09/14/2011    Class: Diagnosis of  . Stroke ([place]) 09/14/2011    Class: Diagnosis of  . Seizures ([place]) 09/14/2011    Class: Chronic  . Breast cancer ([place]) 09/14/2011    Class: History of  . Memory loss 09/14/2011    Class: Chronic  . Head trauma 09/14/2011    Class: Acute  . ANEMIA OF CHRONIC DISEASE 02/24/2010  . ANEMIA-UNSPECIFIED 02/24/2010  . DYSPHAGIA 02/24/2010  . FECAL OCCULT BLOOD 02/24/2010  . PERSONAL HX COLONIC POLYPS 02/24/2010  . ADENOCARCINOMA, BREAST 10/03/2007  .  ADENOMATOUS COLONIC POLYP 10/03/2007  . HYPERLIPIDEMIA 10/03/2007  . ANXIETY 10/03/2007  . HYPERTENSION 10/03/2007  . ESOPHAGEAL STRICTURE 10/03/2007  . GERD 10/03/2007  . PEPTIC STRICTURE 10/03/2007  . HIATAL HERNIA 10/03/2007  . DIVERTICULOSIS, COLON 10/03/2007  . OSTEOARTHRITIS 10/03/2007    Terrance Lanahan, PT 06/14/15, 1:44 PM  [facility] [address] [place], [place], [address] Phone: [phone]   Fax:  [phone]

## 2015-06-14 ENCOUNTER — Encounter: Payer: Self-pay | Admitting: Rehabilitative and Restorative Service Providers"

## 2015-06-14 NOTE — Therapy (Signed)
[facility] [address] [place], [place], [address] Phone: [phone]   Fax:  [phone]  Patient Details  Name: [name] MRN: [id number] Date of Birth: 07-05-1935 Referring Provider:  No ref. provider found  Encounter Date:  05/19/2015  PHYSICAL THERAPY DISCHARGE SUMMARY  Visits from Start of Care: 6  Current functional level related to goals / functional outcomes:     PT Short Term Goals - 05/19/15 1315    PT SHORT TERM GOAL #1   Title The patient will perform HEP with family assistance for balance, strengthening, and flexibility to reduce pain + improve moiblity.     Baseline Met on 05/19/2015   Time 4   Period Weeks   Status Achieved   PT SHORT TERM GOAL #2   Title The patient will improve Berg score from 33/56 up to 39/56 to demo decreasing risk for falls.     Baseline Patient met goal improving to 44/56.    Time 4   Period Weeks   Status Achieved   PT SHORT TERM GOAL #3   Title The patient will improve gait speed from 2.53 ft/sec to > or equal to 2.9 ft/sec for improved community  mobility.   Baseline Patient met goal scoring 3.2 ft/sec-met on 05/10/2015   Time 4   Period Weeks   Status Achieved   PT SHORT TERM GOAL #4   Title The patient will ambulate x 300 ft level indoor surfaces without loss of balance to demo improving safety for household mobility.   Baseline Met on 05/10/2015.   Time 4   Period Weeks   Status Achieved         PT Long Term Goals - 05/19/15 1315    PT LONG TERM GOAL #1   Title The patient will be able to perform post d/c HEP for continued mobility training.     Baseline Met on 05/19/2015   Time 8   Period Weeks   Status Achieved   PT LONG TERM GOAL #2   Title The patient will improve Berg from 33/56 to > or equal to 42/56 to demo improved balance.   Baseline Met on 05/10/2015 scoring 44/56.   Time 8   Period Weeks   Status Achieved   PT LONG TERM GOAL #3   Title The patient will improve TUG from 15.95 seconds to < or equal to 14 seconds.   Baseline Met scoring 12.53 seconds on 05/10/2015   Time 8   Period Weeks   Status Achieved   PT LONG TERM GOAL #4   Title The patient will improve gait speed from 2.53 ft/sec to > or equal to 3.2 ft/sec to demo improving mobility.   Baseline Met on 05/10/2015 with 3.2 ft/sec gait speed   Time 8   Period Weeks   Status Achieved   PT LONG TERM GOAL #5   Title The patient and her husband will be provided home safety checklist for fall prevention.   Baseline Target date 06/21/2015   Time 8   Period Weeks   Status Achieved        Remaining deficits: High level balance Cognitive impairments   Education / Equipment: HEP, home safety.  Plan: Patient agrees to discharge.  Patient goals were met. Patient is being discharged due to meeting the stated rehab goals.  ?????       Thank you for the referral of this patient. Rudell Cobb, MPT    [name] 06/14/2015,  12:28 PM  [facility] [address] [place], [place], [address] Phone: [phone]   Fax:  [phone]

## 2015-06-16 DIAGNOSIS — Z7901 Long term (current) use of anticoagulants: Secondary | ICD-10-CM | POA: Diagnosis not present

## 2015-06-16 DIAGNOSIS — I8290 Acute embolism and thrombosis of unspecified vein: Secondary | ICD-10-CM | POA: Diagnosis not present

## 2015-06-25 DIAGNOSIS — I10 Essential (primary) hypertension: Secondary | ICD-10-CM | POA: Diagnosis not present

## 2015-06-25 DIAGNOSIS — Z7901 Long term (current) use of anticoagulants: Secondary | ICD-10-CM | POA: Diagnosis not present

## 2015-06-25 DIAGNOSIS — Z853 Personal history of malignant neoplasm of breast: Secondary | ICD-10-CM | POA: Diagnosis not present

## 2015-06-25 DIAGNOSIS — E668 Other obesity: Secondary | ICD-10-CM | POA: Diagnosis not present

## 2015-06-25 DIAGNOSIS — I2510 Atherosclerotic heart disease of native coronary artery without angina pectoris: Secondary | ICD-10-CM | POA: Diagnosis not present

## 2015-06-25 DIAGNOSIS — E785 Hyperlipidemia, unspecified: Secondary | ICD-10-CM | POA: Diagnosis not present

## 2015-06-25 DIAGNOSIS — Z86718 Personal history of other venous thrombosis and embolism: Secondary | ICD-10-CM | POA: Diagnosis not present

## 2015-06-25 DIAGNOSIS — G40301 Generalized idiopathic epilepsy and epileptic syndromes, not intractable, with status epilepticus: Secondary | ICD-10-CM | POA: Diagnosis not present

## 2015-06-30 DIAGNOSIS — Z6830 Body mass index (BMI) 30.0-30.9, adult: Secondary | ICD-10-CM | POA: Diagnosis not present

## 2015-06-30 DIAGNOSIS — M79662 Pain in left lower leg: Secondary | ICD-10-CM | POA: Diagnosis not present

## 2015-06-30 DIAGNOSIS — Z7901 Long term (current) use of anticoagulants: Secondary | ICD-10-CM | POA: Diagnosis not present

## 2015-06-30 DIAGNOSIS — R609 Edema, unspecified: Secondary | ICD-10-CM | POA: Diagnosis not present

## 2015-07-14 DIAGNOSIS — Z7901 Long term (current) use of anticoagulants: Secondary | ICD-10-CM | POA: Diagnosis not present

## 2015-07-14 DIAGNOSIS — I8290 Acute embolism and thrombosis of unspecified vein: Secondary | ICD-10-CM | POA: Diagnosis not present

## 2015-07-22 DIAGNOSIS — G309 Alzheimer's disease, unspecified: Secondary | ICD-10-CM | POA: Diagnosis not present

## 2015-07-27 ENCOUNTER — Ambulatory Visit (INDEPENDENT_AMBULATORY_CARE_PROVIDER_SITE_OTHER): Payer: Medicare Other | Admitting: Podiatry

## 2015-07-27 ENCOUNTER — Encounter: Payer: Self-pay | Admitting: Podiatry

## 2015-07-27 VITALS — BP 114/52 | HR 57 | Resp 16

## 2015-07-27 DIAGNOSIS — L600 Ingrowing nail: Secondary | ICD-10-CM | POA: Diagnosis not present

## 2015-07-27 MED ORDER — NEOMYCIN-POLYMYXIN-HC 1 % OT SOLN: OTIC | Status: DC

## 2015-07-27 NOTE — Progress Notes (Signed)
She presents today with a chief complaint of a possible ingrown toenail to the tibial border of the hallux right. She states that I routinely take this to try to get that ingrown out however it is further than I can region at this point.   Objective: Vital signs are stable she is alert and oriented 3. Pulses are strongly palpable. Neurologic sensorium is intact for Semmes-Weinstein monofilament. Deep tendon reflexes are intact bilaterally muscle strength +5 over 5 dorsiflexion plantar flexors and inverters and everters all intrinsic musculature is intact. Orthopedic evaluation demonstrates all joints distal to the ankle level for range of motion without crepitation. Cutaneous evaluation demonstrates supple well-hydrated decubitus no erythema edema cellulitis drainage or odor. Sharp and rated now margined with some mild erythema along the tibial border of the hallux right.   Assessment: ingrown nail mild paronychia hallux right.  Plan: discussed etiology pathology conservative versus surgical therapies. At this point we performed an chemical matrixectomy after local anesthesia was administered. She tolerated this procedure well and Surgicel was placed to help with hemostasis. She was given both oral and written home-going instructions for care and soaking of her toe as well as a prescription for Cortisporin Otic to be applied twice daily I will follow-up with her in 1 week to make sure she is doing well.

## 2015-07-27 NOTE — Patient Instructions (Signed)

## 2015-08-03 ENCOUNTER — Ambulatory Visit (INDEPENDENT_AMBULATORY_CARE_PROVIDER_SITE_OTHER): Payer: Medicare Other | Admitting: Podiatry

## 2015-08-03 ENCOUNTER — Encounter: Payer: Self-pay | Admitting: Podiatry

## 2015-08-03 DIAGNOSIS — L600 Ingrowing nail: Secondary | ICD-10-CM

## 2015-08-03 NOTE — Patient Instructions (Signed)

## 2015-08-04 NOTE — Progress Notes (Signed)
She presents today for follow-up of matrixectomy tibial border hallux right. She states that she continues to soak in Betadine warm water. She states that the toe feels much better.   Objective: vital signs are stable she is alert and oriented 3. Pulses are strongly palpable. Neurologic sensorium is intact per Semmes-Weinstein monofilament. The wound appears to be healing nicely there is scab over it I see no drainage.  Assessment: well-healing matrixectomy tibial border hallux right.  Plan: I instructed her to discontinue Betadine and start with Epsom salts and warm water soaks covered during the day and leave open at bedtime. Continue to soak until completely resolved no redness no drainage and no pain.

## 2015-08-05 ENCOUNTER — Telehealth: Payer: Self-pay | Admitting: *Deleted

## 2015-08-05 NOTE — Telephone Encounter (Signed)
Pt's dtr, [name] states at pt's last visit pt was not given an antibiotic.  Left message informing [name], at the time of the pt's office visit, her toe showed no symptoms of infection and she was instructed to continue epsom salt warm water soaks, and allow to air dry in the evening.  [name] called back states pt was told if the infection did not get out, she could get a bone infections and have to have the toe removed.  I told pt, that if the toenail portion had not be removed the possibility of an infection to the bone would increase, the chemical matrixectomy was performed and pt was given antibiotic drops to use after her post-op soaks, and at the last visit there were not signs of infection, pt was instructed to allow area to air dry in the evening, until no redness, drainage or pain. Pt's dtr states understanding.

## 2015-09-20 ENCOUNTER — Ambulatory Visit (INDEPENDENT_AMBULATORY_CARE_PROVIDER_SITE_OTHER): Payer: Medicare HMO | Admitting: Sports Medicine

## 2015-09-20 ENCOUNTER — Encounter: Payer: Self-pay | Admitting: Sports Medicine

## 2015-09-20 DIAGNOSIS — L600 Ingrowing nail: Secondary | ICD-10-CM

## 2015-09-20 NOTE — Progress Notes (Signed)
Patient ID: [name], female   DOB: 12/29/1934, 81 y.o.   MRN: [id number] Subjective: [name] is a 81 y.o.  f/female patient returns to office today for follow up evaluation after having Right Hallux medial permanent nail avulsion performed on 07-27-15. Patient has been soaking using epsom salt twice weekly and applying topical antibiotic drops covered with bandaid occasionally. Patient states that she was concerned because scab was still there and she thought she saw some "white stuff" coming from the area. Denies fever/chills/nausea/vomitting/any other related constitutional symptoms at this time.  Patient Active Problem List   Diagnosis Date Noted  . Abnormality of gait 04/28/2013  . Neck pain 04/28/2013  . Temporal arteritis ([place]) 10/01/2012    Class: Chronic  . Depression 09/14/2011    Class: Chronic  . Deep vein thrombosis ([place]) 09/14/2011    Class: Diagnosis of  . Pulmonary embolism ([facility]) 09/14/2011    Class: Diagnosis of  . Stroke ([place]) 09/14/2011    Class: Diagnosis of  . Seizures ([place]) 09/14/2011    Class: Chronic  . Breast cancer ([place]) 09/14/2011    Class: History of  . Memory loss 09/14/2011    Class: Chronic  . Head trauma 09/14/2011    Class: Acute  . ANEMIA OF CHRONIC DISEASE 02/24/2010  . ANEMIA-UNSPECIFIED 02/24/2010  . DYSPHAGIA 02/24/2010  . FECAL OCCULT BLOOD 02/24/2010  . PERSONAL HX COLONIC POLYPS 02/24/2010  . ADENOCARCINOMA, BREAST 10/03/2007  . ADENOMATOUS COLONIC POLYP 10/03/2007  . HYPERLIPIDEMIA 10/03/2007  . ANXIETY 10/03/2007  . HYPERTENSION 10/03/2007  . ESOPHAGEAL STRICTURE 10/03/2007  . GERD 10/03/2007  . PEPTIC STRICTURE 10/03/2007  . HIATAL HERNIA 10/03/2007  . DIVERTICULOSIS, COLON 10/03/2007  . OSTEOARTHRITIS 10/03/2007   Current Outpatient Prescriptions on File Prior to Visit  Medication Sig Dispense Refill  . acetaminophen (TYLENOL) 500 MG tablet Take 500 mg by mouth every 6 (six) hours as needed for pain.    Marland Kitchen  ALPRAZolam (XANAX) 0.5 MG tablet Take 0.5 mg by mouth 2 (two) times daily as needed for anxiety.     Marland Kitchen aspirin 81 MG tablet Take 81 mg by mouth daily.    Marland Kitchen atorvastatin (LIPITOR) 40 MG tablet     . Calcium Citrate-Vitamin D (CITRACAL + D PO) Take 2 tablets by mouth daily.    . CRESTOR 40 MG tablet     . lamoTRIgine (LAMICTAL) 100 MG tablet 1/2 tab in am and one tab every night 150 tablet 3  . lisinopril-hydrochlorothiazide (PRINZIDE,ZESTORETIC) 20-12.5 MG per tablet Take 1 tablet by mouth daily.     . metoprolol tartrate (LOPRESSOR) 25 MG tablet Take 25 mg by mouth 2 (two) times daily.     . metroNIDAZOLE (FLAGYL) 250 MG tablet Take 1 tablet (250 mg total) by mouth 4 (four) times daily. 40 tablet 0  . Multiple Vitamin (MULTIVITAMIN WITH MINERALS) TABS Take 1 tablet by mouth daily.    . NEOMYCIN-POLYMYXIN-HYDROCORTISONE (CORTISPORIN) 1 % SOLN otic solution Apply 1-2 drops to toe BID after soaking 10 mL 1  . rivastigmine (EXELON) 4.6 mg/24hr     . saccharomyces boulardii (FLORASTOR) 250 MG capsule Take 1 capsule (250 mg total) by mouth 2 (two) times daily. 60 capsule 0  . sertraline (ZOLOFT) 100 MG tablet Take 100 mg by mouth daily.     . valsartan-hydrochlorothiazide (DIOVAN-HCT) 160-12.5 MG per tablet Take 12.5 tablets by mouth daily.    Marland Kitchen warfarin (COUMADIN) 5 MG tablet Take 5-7.5 mg by mouth daily. Take one tablet (5  mg) on Mondays, Tuesdays, Wednesday, Fridays and Sauturdays. Take one and one-half tablets (7.5 mg) on Thursdays and Sundays.     No current facility-administered medications on file prior to visit.   Allergies  Allergen Reactions  . Iohexol      Code: RASH, Desc: REMOTE H/O LIP TINGLING, NUMBNESS, AND HIVES WITH IV CM- 13 HR PRE-MED GIVEN AND PATIENT TOLERATED WELL- LY, Onset Date: [id number]   . Sulfonamide Derivatives Swelling   Objective:  General: Well developed, nourished, in no acute distress, alert and oriented x3   Dermatology: Skin is warm, dry and supple  bilateral. Right hallux medialnail bed appears to be clean, dry, with moderate surrounding eschar/scab. (-) Erythema. (-) Edema. (-) serosanguous drainage or puss present. The remaining nails appear unremarkable at this time. There are no other lesions or other signs of infection present.  Neurovascular status: Intact. No lower extremity swelling; No pain with calf compression bilateral.  Musculoskeletal: Decreased tenderness to palpation of the right hallux medial nail fold. Muscular strength within normal limits bilateral.   Assesement and Plan: Problem List Items Addressed This Visit    None    Visit Diagnoses    Ingrown right greater toenail    -  Primary    S/p PNA Tibial border, scab      -Examined patient  -Cleansed right hallux medial nail fold and gently scrubbed with peroxide and curetted away eschar at site and applied antibiotic cream covered with bandaid.  -Discussed plan of care with patient. -Patient to discontinue with Epsom soaks and antibiotic topically to area; explained to patient that "white stuff" was probably trapped drainage around scab and once removed today no drainage or puss expressed  -Educated patient on long term care after nail surgery. -Patient was instructed to monitor the toe for reoccurrence and signs of infection; Patient advised to return to office if toe becomes red, hot or swollen. -Patient is to return as needed or sooner if problems arise.  Landis Martins, DPM

## 2015-11-01 ENCOUNTER — Encounter: Payer: Self-pay | Admitting: Sports Medicine

## 2015-11-01 ENCOUNTER — Ambulatory Visit (INDEPENDENT_AMBULATORY_CARE_PROVIDER_SITE_OTHER): Payer: Medicare HMO | Admitting: Sports Medicine

## 2015-11-01 DIAGNOSIS — L03011 Cellulitis of right finger: Secondary | ICD-10-CM

## 2015-11-01 DIAGNOSIS — L03032 Cellulitis of left toe: Secondary | ICD-10-CM

## 2015-11-01 DIAGNOSIS — M79671 Pain in right foot: Secondary | ICD-10-CM | POA: Diagnosis not present

## 2015-11-01 MED ORDER — CEPHALEXIN 500 MG PO CAPS: 500.0000 mg | ORAL_CAPSULE | Freq: Three times a day (TID) | ORAL | Status: DC

## 2015-11-01 NOTE — Patient Instructions (Signed)
  THE DAY AFTER THE PROCEDURE  Place 1 tablespoon of betadine solution or Epsom salt in a quart of warm tap water.  Submerge your foot or feet with outer bandage intact for the initial soak; this will allow the bandage to become moist and wet for easy lift off.  Once you remove your bandage, continue to soak in the solution for 20 minutes.  This soak should be done twice a day.  Next, remove your foot or feet from solution, blot dry the affected area and cover.  You may use a band aid large enough to cover the area or use gauze and tape.  Apply other medications to the area as directed by the doctor such as cortisporin otic solution (ear drops) or neosporin.  IF YOUR SKIN BECOMES IRRITATED WHILE USING THESE INSTRUCTIONS, IT IS OKAY TO SWITCH TO WHITE VINEGAR AND WATER.

## 2015-11-01 NOTE — Progress Notes (Signed)
Patient ID: [name], female   DOB: [birth year], 81 y.o.   MRN: [id number] Subjective: [name] is a 81 y.o.  female patient returns to office today for follow up evaluation after having Right Hallux medial permanent nail avulsion performed on 07-27-15 with Dr. Milinda Pointer. Patient states that she ocassional gets bleeding and puss a few days ago at the medial corner. Denies fever/chills/nausea/vomitting/any other related constitutional symptoms at this time.  Patient Active Problem List   Diagnosis Date Noted  . Abnormality of gait 04/28/2013  . Neck pain 04/28/2013  . Temporal arteritis ([place]) 10/01/2012    Class: Chronic  . Depression 09/14/2011    Class: Chronic  . Deep vein thrombosis ([place]) 09/14/2011    Class: Diagnosis of  . Pulmonary embolism ([place]) 09/14/2011    Class: Diagnosis of  . Stroke ([place]) 09/14/2011    Class: Diagnosis of  . Seizures ([place]) 09/14/2011    Class: Chronic  . Breast cancer ([place]) 09/14/2011    Class: History of  . Memory loss 09/14/2011    Class: Chronic  . Head trauma 09/14/2011    Class: Acute  . ANEMIA OF CHRONIC DISEASE 02/24/2010  . ANEMIA-UNSPECIFIED 02/24/2010  . DYSPHAGIA 02/24/2010  . FECAL OCCULT BLOOD 02/24/2010  . PERSONAL HX COLONIC POLYPS 02/24/2010  . ADENOCARCINOMA, BREAST 10/03/2007  . ADENOMATOUS COLONIC POLYP 10/03/2007  . HYPERLIPIDEMIA 10/03/2007  . ANXIETY 10/03/2007  . HYPERTENSION 10/03/2007  . ESOPHAGEAL STRICTURE 10/03/2007  . GERD 10/03/2007  . PEPTIC STRICTURE 10/03/2007  . HIATAL HERNIA 10/03/2007  . DIVERTICULOSIS, COLON 10/03/2007  . OSTEOARTHRITIS 10/03/2007   Current Outpatient Prescriptions on File Prior to Visit  Medication Sig Dispense Refill  . acetaminophen (TYLENOL) 500 MG tablet Take 500 mg by mouth every 6 (six) hours as needed for pain.    Marland Kitchen ALPRAZolam (XANAX) 0.5 MG tablet Take 0.5 mg by mouth 2 (two) times daily as needed for anxiety.     Marland Kitchen amoxicillin-clavulanate (AUGMENTIN) 875-125 MG tablet      . aspirin 81 MG tablet Take 81 mg by mouth daily.    Marland Kitchen atorvastatin (LIPITOR) 40 MG tablet     . Calcium Citrate-Vitamin D (CITRACAL + D PO) Take 2 tablets by mouth daily.    . CRESTOR 40 MG tablet     . lamoTRIgine (LAMICTAL) 100 MG tablet 1/2 tab in am and one tab every night 150 tablet 3  . lisinopril-hydrochlorothiazide (PRINZIDE,ZESTORETIC) 20-12.5 MG per tablet Take 1 tablet by mouth daily.     . metoprolol tartrate (LOPRESSOR) 25 MG tablet Take 25 mg by mouth 2 (two) times daily.     . metroNIDAZOLE (FLAGYL) 250 MG tablet Take 1 tablet (250 mg total) by mouth 4 (four) times daily. 40 tablet 0  . Multiple Vitamin (MULTIVITAMIN WITH MINERALS) TABS Take 1 tablet by mouth daily.    . NEOMYCIN-POLYMYXIN-HYDROCORTISONE (CORTISPORIN) 1 % SOLN otic solution Apply 1-2 drops to toe BID after soaking 10 mL 1  . rivastigmine (EXELON) 4.6 mg/24hr     . saccharomyces boulardii (FLORASTOR) 250 MG capsule Take 1 capsule (250 mg total) by mouth 2 (two) times daily. 60 capsule 0  . sertraline (ZOLOFT) 100 MG tablet Take 100 mg by mouth daily.     . valsartan-hydrochlorothiazide (DIOVAN-HCT) 160-12.5 MG per tablet Take 12.5 tablets by mouth daily.    Marland Kitchen warfarin (COUMADIN) 2.5 MG tablet     . warfarin (COUMADIN) 5 MG tablet Take 5-7.5 mg by mouth daily. Take one tablet (5 mg)  on Mondays, Tuesdays, Wednesday, Fridays and Sauturdays. Take one and one-half tablets (7.5 mg) on Thursdays and Sundays.     No current facility-administered medications on file prior to visit.   Allergies  Allergen Reactions  . Iohexol      Code: RASH, Desc: REMOTE H/O LIP TINGLING, NUMBNESS, AND HIVES WITH IV CM- 13 HR PRE-MED GIVEN AND PATIENT TOLERATED WELL- LY, Onset Date: [id number]   . Sulfonamide Derivatives Swelling   Objective:  General: Well developed, nourished, in no acute distress, alert and oriented x3   Dermatology: Skin is warm, dry and supple bilateral. Right hallux medial nail bed appears to be clean, dry,  with moderate surrounding eschar/scab and tenderness remaining nail plate is lifting at medial margin and is mildly dystrophic. (-) Erythema. (+) Edema. (-) serosanguous drainage or puss present. The remaining nails appear unremarkable at this time. There are no other lesions or other signs of infection present.  Neurovascular status: Intact. No lower extremity swelling; No pain with calf compression bilateral.  Musculoskeletal: Mild tenderness to palpation of the right hallux medial nail fold. Muscular strength within normal limits bilateral.   Assesement and Plan: Problem List Items Addressed This Visit    None    Visit Diagnoses    Paronychia, right    -  Primary    medial hallux with remaining dystrophic nail    Relevant Medications    cephALEXin (KEFLEX) 500 MG capsule    Right foot pain          -Examined patient  -Recommend repeat procedure to evacuate medial hallux nail fold and remove the remainder dystrophic nail -Discussed treatment alternatives and plan of care; Explained permanent/temporary nail avulsion and post procedure course to patient. Patient opt for repeat PNA at medial border and removal of remaining nail at right hallux - After a verbal consent, injected 3 ml of a 50:50 mixture of 2% plain lidocaine and 0.5% plain marcaine in a normal hallux block fashion. Next, a betadine prep was performed. Anesthesia was tested and found to be appropriate.  The offending medial right hallux nail border and remaining hallux nail was then incised from the hyponychium to the epinychium. The offending nail border was removed and cleared from the field. The area was curretted for any remaining nail or spicules. Phenol application performed and the area was then flushed with alcohol and dressed with antibiotic cream and a dry sterile dressing. No puss noted during procedure -Patient was instructed to leave the dressing intact for today and begin soaking  in a weak solution of Epsom salt  and water tomorrow. Patient was instructed to  soak for 15 minutes each day and apply neosporin and a gauze or bandaid dressing each day. -Rx Keflex 500mg  tid x 10 days for preventive measures  -Patient was instructed to monitor the toe for reoccurrence and worsening signs of infection; Patient advised to return to office or go to ER if toe becomes red, hot or swollen. -Patient is to return in 1 week for nail check or sooner if problems arise.  Landis Martins, DPM

## 2015-11-05 ENCOUNTER — Telehealth: Payer: Self-pay | Admitting: *Deleted

## 2015-11-05 NOTE — Telephone Encounter (Signed)
Pt's dtr, Patsy states pt has been seen in office 3 times for an right 1st ingrown toenail and last visit the entire toenail was removed and now the left side of the toenail painful, swollen with pus.  Patsy states with pt's new insurance she can only see Dr. Cannon Kettle.  I offered Patsy an appt for pt today, but she states she can not come in today, but will keep the 11/08/2015 appt.  I instructed Patsy to have pt continue the Cephalexin prescribed 11/01/2015, epsom salt soaks and antibiotic ointment dressings.  I told Patsy I would inform Dr. Cannon Kettle and if there were changes in therapy, I would call again.  Patsy states understanding.

## 2015-11-05 NOTE — Telephone Encounter (Signed)
Yes agree with below. Keflex, soaking and see me as soon as possible. If worsens over weekend go to ER or Urgent Care.

## 2015-11-08 ENCOUNTER — Encounter: Payer: Self-pay | Admitting: Sports Medicine

## 2015-11-08 ENCOUNTER — Ambulatory Visit (INDEPENDENT_AMBULATORY_CARE_PROVIDER_SITE_OTHER): Payer: Medicare HMO | Admitting: Sports Medicine

## 2015-11-08 DIAGNOSIS — M79674 Pain in right toe(s): Secondary | ICD-10-CM

## 2015-11-08 DIAGNOSIS — Z9889 Other specified postprocedural states: Secondary | ICD-10-CM

## 2015-11-08 NOTE — Progress Notes (Signed)
Patient ID: [name], female   DOB: [birth year], 81 y.o.   MRN: [id number] Subjective: [name] is a 81 y.o. female patient returns to office today for follow up evaluation after having Right Hallux total temporary nail avulsion performed on 11-01-15. Patient has been soaking using betadine and epsom salt and applying topical antibiotic covered with bandaid daily. Patient still has a few days of antibiotics left. Admits that she had some drainage for a few days but now area has dry blood. Patient deniesfever/chills/nausea/vomitting/any other related constitutional symptoms at this time.  Patient Active Problem List   Diagnosis Date Noted  . Abnormality of gait 04/28/2013  . Neck pain 04/28/2013  . Temporal arteritis ([place]) 10/01/2012    Class: Chronic  . Depression 09/14/2011    Class: Chronic  . Deep vein thrombosis ([place]) 09/14/2011    Class: Diagnosis of  . Pulmonary embolism ([place]) 09/14/2011    Class: Diagnosis of  . Stroke ([place]) 09/14/2011    Class: Diagnosis of  . Seizures ([place]) 09/14/2011    Class: Chronic  . Breast cancer ([place]) 09/14/2011    Class: History of  . Memory loss 09/14/2011    Class: Chronic  . Head trauma 09/14/2011    Class: Acute  . ANEMIA OF CHRONIC DISEASE 02/24/2010  . ANEMIA-UNSPECIFIED 02/24/2010  . DYSPHAGIA 02/24/2010  . FECAL OCCULT BLOOD 02/24/2010  . PERSONAL HX COLONIC POLYPS 02/24/2010  . ADENOCARCINOMA, BREAST 10/03/2007  . ADENOMATOUS COLONIC POLYP 10/03/2007  . HYPERLIPIDEMIA 10/03/2007  . ANXIETY 10/03/2007  . HYPERTENSION 10/03/2007  . ESOPHAGEAL STRICTURE 10/03/2007  . GERD 10/03/2007  . PEPTIC STRICTURE 10/03/2007  . HIATAL HERNIA 10/03/2007  . DIVERTICULOSIS, COLON 10/03/2007  . OSTEOARTHRITIS 10/03/2007    Current Outpatient Prescriptions on File Prior to Visit  Medication Sig Dispense Refill  . acetaminophen (TYLENOL) 500 MG tablet Take 500 mg by mouth every 6 (six) hours as needed for pain.    Marland Kitchen ALPRAZolam (XANAX)  0.5 MG tablet Take 0.5 mg by mouth 2 (two) times daily as needed for anxiety.     Marland Kitchen amoxicillin-clavulanate (AUGMENTIN) 875-125 MG tablet     . aspirin 81 MG tablet Take 81 mg by mouth daily.    Marland Kitchen atorvastatin (LIPITOR) 40 MG tablet     . Calcium Citrate-Vitamin D (CITRACAL + D PO) Take 2 tablets by mouth daily.    . cephALEXin (KEFLEX) 500 MG capsule Take 1 capsule (500 mg total) by mouth 3 (three) times daily. 30 capsule 0  . CRESTOR 40 MG tablet     . lamoTRIgine (LAMICTAL) 100 MG tablet 1/2 tab in am and one tab every night 150 tablet 3  . lisinopril-hydrochlorothiazide (PRINZIDE,ZESTORETIC) 20-12.5 MG per tablet Take 1 tablet by mouth daily.     . metoprolol tartrate (LOPRESSOR) 25 MG tablet Take 25 mg by mouth 2 (two) times daily.     . metroNIDAZOLE (FLAGYL) 250 MG tablet Take 1 tablet (250 mg total) by mouth 4 (four) times daily. 40 tablet 0  . Multiple Vitamin (MULTIVITAMIN WITH MINERALS) TABS Take 1 tablet by mouth daily.    . NEOMYCIN-POLYMYXIN-HYDROCORTISONE (CORTISPORIN) 1 % SOLN otic solution Apply 1-2 drops to toe BID after soaking 10 mL 1  . rivastigmine (EXELON) 4.6 mg/24hr     . saccharomyces boulardii (FLORASTOR) 250 MG capsule Take 1 capsule (250 mg total) by mouth 2 (two) times daily. 60 capsule 0  . sertraline (ZOLOFT) 100 MG tablet Take 100 mg by mouth daily.     Marland Kitchen  valsartan-hydrochlorothiazide (DIOVAN-HCT) 160-12.5 MG per tablet Take 12.5 tablets by mouth daily.    Marland Kitchen warfarin (COUMADIN) 2.5 MG tablet     . warfarin (COUMADIN) 5 MG tablet Take 5-7.5 mg by mouth daily. Take one tablet (5 mg) on Mondays, Tuesdays, Wednesday, Fridays and Sauturdays. Take one and one-half tablets (7.5 mg) on Thursdays and Sundays.     No current facility-administered medications on file prior to visit.    Allergies  Allergen Reactions  . Iohexol      Code: RASH, Desc: REMOTE H/O LIP TINGLING, NUMBNESS, AND HIVES WITH IV CM- 13 HR PRE-MED GIVEN AND PATIENT TOLERATED WELL- LY, Onset Date:  [id number]   . Sulfonamide Derivatives Swelling    Objective:  General: Well developed, nourished, in no acute distress, alert and oriented x3   Dermatology: Skin is warm, dry and supple bilateral. Right hallux nail bed appears to be clean, dry, with mild granular tissue and surrounding eschar/scab. (-) Erythema. (-) Edema. (-) serosanguous drainage present. The remaining nails appear unremarkable/ free from acute infection at this time. There are no other lesions or other signs of infection  present.  Neurovascular status: Intact. No lower extremity swelling; No pain with calf compression bilateral.  Musculoskeletal: Decreased tenderness to palpation of the Right hallux nail bed. Muscular strength within normal limits bilateral.   Assesement and Plan: Problem List Items Addressed This Visit    None    Visit Diagnoses    Status post nail surgery    -  Primary    Right hallux total nail avulsion 11-01-15    Toe pain, right           -Examined patient  -Cleansed right hallux nail bed and gently scrubbed with peroxide and curetted away eschar at site and applied antibiotic cream covered with bandaid.  -Discussed plan of care with patient. -Patient to cont soaking in a weak solution of Epsom salt and warm water. Patient was instructed to soak for 15-20 minutes each day until the toe appears normal and there is no drainage, redness, tenderness, or swelling at the procedure site, and apply neosporin and a gauze or bandaid dressing each day as needed. May leave open to air at night. -Patient to finish Augmentin -Educated patient on long term care after nail surgery. -Patient was instructed to monitor the toe for reoccurrence and signs of infection; Patient advised to return to office or got to ER if toe becomes red, hot or swollen. -Patient is to return as needed or sooner if problems arise.  Landis Martins, DPM

## 2015-11-08 NOTE — Patient Instructions (Signed)

## 2015-12-12 ENCOUNTER — Emergency Department (HOSPITAL_COMMUNITY): Payer: Medicare HMO

## 2015-12-12 ENCOUNTER — Emergency Department (HOSPITAL_COMMUNITY)
Admission: EM | Admit: 2015-12-12 | Discharge: 2015-12-12 | Disposition: A | Discharge status: Home / Self Care | Payer: Medicare HMO | Attending: Emergency Medicine | Admitting: Emergency Medicine

## 2015-12-12 ENCOUNTER — Encounter (HOSPITAL_COMMUNITY): Payer: Self-pay | Admitting: Emergency Medicine

## 2015-12-12 DIAGNOSIS — Z86010 Personal history of colonic polyps: Secondary | ICD-10-CM | POA: Insufficient documentation

## 2015-12-12 DIAGNOSIS — G40909 Epilepsy, unspecified, not intractable, without status epilepticus: Secondary | ICD-10-CM | POA: Insufficient documentation

## 2015-12-12 DIAGNOSIS — S199XXA Unspecified injury of neck, initial encounter: Secondary | ICD-10-CM | POA: Diagnosis not present

## 2015-12-12 DIAGNOSIS — Y998 Other external cause status: Secondary | ICD-10-CM | POA: Insufficient documentation

## 2015-12-12 DIAGNOSIS — Z853 Personal history of malignant neoplasm of breast: Secondary | ICD-10-CM | POA: Diagnosis not present

## 2015-12-12 DIAGNOSIS — Y9289 Other specified places as the place of occurrence of the external cause: Secondary | ICD-10-CM | POA: Insufficient documentation

## 2015-12-12 DIAGNOSIS — Z87440 Personal history of urinary (tract) infections: Secondary | ICD-10-CM | POA: Insufficient documentation

## 2015-12-12 DIAGNOSIS — K219 Gastro-esophageal reflux disease without esophagitis: Secondary | ICD-10-CM | POA: Insufficient documentation

## 2015-12-12 DIAGNOSIS — M810 Age-related osteoporosis without current pathological fracture: Secondary | ICD-10-CM | POA: Diagnosis not present

## 2015-12-12 DIAGNOSIS — I2510 Atherosclerotic heart disease of native coronary artery without angina pectoris: Secondary | ICD-10-CM | POA: Diagnosis not present

## 2015-12-12 DIAGNOSIS — S4992XA Unspecified injury of left shoulder and upper arm, initial encounter: Secondary | ICD-10-CM | POA: Insufficient documentation

## 2015-12-12 DIAGNOSIS — Z8673 Personal history of transient ischemic attack (TIA), and cerebral infarction without residual deficits: Secondary | ICD-10-CM | POA: Insufficient documentation

## 2015-12-12 DIAGNOSIS — Y9389 Activity, other specified: Secondary | ICD-10-CM | POA: Diagnosis not present

## 2015-12-12 DIAGNOSIS — F419 Anxiety disorder, unspecified: Secondary | ICD-10-CM | POA: Diagnosis not present

## 2015-12-12 DIAGNOSIS — G309 Alzheimer's disease, unspecified: Secondary | ICD-10-CM | POA: Insufficient documentation

## 2015-12-12 DIAGNOSIS — Z9889 Other specified postprocedural states: Secondary | ICD-10-CM | POA: Insufficient documentation

## 2015-12-12 DIAGNOSIS — W01198A Fall on same level from slipping, tripping and stumbling with subsequent striking against other object, initial encounter: Secondary | ICD-10-CM | POA: Diagnosis not present

## 2015-12-12 DIAGNOSIS — E7800 Pure hypercholesterolemia, unspecified: Secondary | ICD-10-CM | POA: Insufficient documentation

## 2015-12-12 DIAGNOSIS — Z862 Personal history of diseases of the blood and blood-forming organs and certain disorders involving the immune mechanism: Secondary | ICD-10-CM | POA: Diagnosis not present

## 2015-12-12 DIAGNOSIS — Z792 Long term (current) use of antibiotics: Secondary | ICD-10-CM | POA: Insufficient documentation

## 2015-12-12 DIAGNOSIS — Z86718 Personal history of other venous thrombosis and embolism: Secondary | ICD-10-CM | POA: Diagnosis not present

## 2015-12-12 DIAGNOSIS — S0990XA Unspecified injury of head, initial encounter: Secondary | ICD-10-CM

## 2015-12-12 DIAGNOSIS — I10 Essential (primary) hypertension: Secondary | ICD-10-CM | POA: Diagnosis not present

## 2015-12-12 DIAGNOSIS — S79912A Unspecified injury of left hip, initial encounter: Secondary | ICD-10-CM | POA: Diagnosis not present

## 2015-12-12 DIAGNOSIS — Z8711 Personal history of peptic ulcer disease: Secondary | ICD-10-CM | POA: Diagnosis not present

## 2015-12-12 DIAGNOSIS — S8992XA Unspecified injury of left lower leg, initial encounter: Secondary | ICD-10-CM | POA: Diagnosis not present

## 2015-12-12 DIAGNOSIS — Z7982 Long term (current) use of aspirin: Secondary | ICD-10-CM | POA: Diagnosis not present

## 2015-12-12 DIAGNOSIS — Z79899 Other long term (current) drug therapy: Secondary | ICD-10-CM | POA: Diagnosis not present

## 2015-12-12 DIAGNOSIS — Z7901 Long term (current) use of anticoagulants: Secondary | ICD-10-CM | POA: Insufficient documentation

## 2015-12-12 DIAGNOSIS — S060X1A Concussion with loss of consciousness of 30 minutes or less, initial encounter: Secondary | ICD-10-CM | POA: Diagnosis not present

## 2015-12-12 DIAGNOSIS — F329 Major depressive disorder, single episode, unspecified: Secondary | ICD-10-CM | POA: Diagnosis not present

## 2015-12-12 DIAGNOSIS — M1990 Unspecified osteoarthritis, unspecified site: Secondary | ICD-10-CM | POA: Diagnosis not present

## 2015-12-12 LAB — BASIC METABOLIC PANEL
Anion gap: 11 (ref 5–15)
BUN: 21 mg/dL — ABNORMAL HIGH (ref 6–20)
CO2: 24 mmol/L (ref 22–32)
Calcium: 8.7 mg/dL — ABNORMAL LOW (ref 8.9–10.3)
Chloride: 107 mmol/L (ref 101–111)
Creatinine, Ser: 1.29 mg/dL — ABNORMAL HIGH (ref 0.44–1.00)
GFR calc Af Amer: 45 mL/min — ABNORMAL LOW (ref >60)
GFR calc non Af Amer: 39 mL/min — ABNORMAL LOW (ref >60)
Glucose, Bld: 114 mg/dL — ABNORMAL HIGH (ref 65–99)
Potassium: 3.9 mmol/L (ref 3.5–5.1)
Sodium: 142 mmol/L (ref 135–145)

## 2015-12-12 LAB — CBC
HEMATOCRIT: 33.8 % — AB (ref 36.0–46.0)
HEMOGLOBIN: 11.2 g/dL — AB (ref 12.0–15.0)
MCH: 30 pg (ref 26.0–34.0)
MCHC: 33.1 g/dL (ref 30.0–36.0)
MCV: 90.6 fL (ref 78.0–100.0)
Platelets: 285 10*3/uL (ref 150–400)
RBC: 3.73 MIL/uL — AB (ref 3.87–5.11)
RDW: 14.4 % (ref 11.5–15.5)
WBC: 7.7 10*3/uL (ref 4.0–10.5)

## 2015-12-12 LAB — PROTIME-INR
INR: 2.61 — ABNORMAL HIGH (ref 0.00–1.49)
Prothrombin Time: 26.7 seconds — ABNORMAL HIGH (ref 11.6–15.2)

## 2015-12-12 MED ORDER — ONDANSETRON HCL 4 MG/2ML IJ SOLN
4.0000 mg | Freq: Once | INTRAMUSCULAR | Status: AC
  Administered 2015-12-12: 4 mg via INTRAVENOUS
  Filled 2015-12-12: qty 2

## 2015-12-12 MED ORDER — ACETAMINOPHEN 325 MG PO TABS
650.0000 mg | ORAL_TABLET | Freq: Once | ORAL | Status: AC
  Administered 2015-12-12: 650 mg via ORAL
  Filled 2015-12-12: qty 2

## 2015-12-12 NOTE — ED Notes (Signed)
Pt in bedroom, fell last night on left side into closet, pt has pain on left face, left arm and left leg. Pt is on blood thinners, c/o ha

## 2015-12-12 NOTE — ED Notes (Signed)
Patient transported to CT.  She will also undergo x-ray while in their department.  Her family remain in [facility]. Rm. A.

## 2015-12-12 NOTE — Discharge Instructions (Signed)
Take tylenol as needed for pain control. Your CT head was negative for head bleed. Very rarely though, people who take coumadin who have head injury with initially negative CT head can have a delayed head bleed. Please return without fail for worsening symptoms, including confusion, inability to walk, vomiting and unable to keep down food/fluids, worsening pain or any other symptoms concerning to you.  The remainder of your x-rays and ct neck was negative for any serious injury. Follow-up closely with your PCP in the next few days for re-check.  Concussion, Adult A concussion, or closed-head injury, is a brain injury caused by a direct blow to the head or by a quick and sudden movement (jolt) of the head or neck. Concussions are usually not life-threatening. Even so, the effects of a concussion can be serious. If you have had a concussion before, you are more likely to experience concussion-like symptoms after a direct blow to the head.  CAUSES  Direct blow to the head, such as from running into another player during a soccer game, being hit in a fight, or hitting your head on a hard surface.  A jolt of the head or neck that causes the brain to move back and forth inside the skull, such as in a car crash. SIGNS AND SYMPTOMS The signs of a concussion can be hard to notice. Early on, they may be missed by you, family members, and health care providers. You may look fine but act or feel differently. Symptoms are usually temporary, but they may last for days, weeks, or even longer. Some symptoms may appear right away while others may not show up for hours or days. Every head injury is different. Symptoms include:  Mild to moderate headaches that will not go away.  A feeling of pressure inside your head.  Having more trouble than usual:  Learning or remembering things you have heard.  Answering questions.  Paying attention or concentrating.  Organizing daily tasks.  Making decisions and  solving problems.  Slowness in thinking, acting or reacting, speaking, or reading.  Getting lost or being easily confused.  Feeling tired all the time or lacking energy (fatigued).  Feeling drowsy.  Sleep disturbances.  Sleeping more than usual.  Sleeping less than usual.  Trouble falling asleep.  Trouble sleeping (insomnia).  Loss of balance or feeling lightheaded or dizzy.  Nausea or vomiting.  Numbness or tingling.  Increased sensitivity to:  Sounds.  Lights.  Distractions.  Vision problems or eyes that tire easily.  Diminished sense of taste or smell.  Ringing in the ears.  Mood changes such as feeling sad or anxious.  Becoming easily irritated or angry for little or no reason.  Lack of motivation.  Seeing or hearing things other people do not see or hear (hallucinations). DIAGNOSIS Your health care provider can usually diagnose a concussion based on a description of your injury and symptoms. He or she will ask whether you passed out (lost consciousness) and whether you are having trouble remembering events that happened right before and during your injury. Your evaluation might include:  A brain scan to look for signs of injury to the brain. Even if the test shows no injury, you may still have a concussion.  Blood tests to be sure other problems are not present. TREATMENT  Concussions are usually treated in an emergency department, in urgent care, or at a clinic. You may need to stay in the hospital overnight for further treatment.  Tell your health care  provider if you are taking any medicines, including prescription medicines, over-the-counter medicines, and natural remedies. Some medicines, such as blood thinners (anticoagulants) and aspirin, may increase the chance of complications. Also tell your health care provider whether you have had alcohol or are taking illegal drugs. This information may affect treatment.  Your health care provider will  send you home with important instructions to follow.  How fast you will recover from a concussion depends on many factors. These factors include how severe your concussion is, what part of your brain was injured, your age, and how healthy you were before the concussion.  Most people with mild injuries recover fully. Recovery can take time. In general, recovery is slower in older persons. Also, persons who have had a concussion in the past or have other medical problems may find that it takes longer to recover from their current injury. HOME CARE INSTRUCTIONS General Instructions  Carefully follow the directions your health care provider gave you.  Only take over-the-counter or prescription medicines for pain, discomfort, or fever as directed by your health care provider.  Take only those medicines that your health care provider has approved.  Do not drink alcohol until your health care provider says you are well enough to do so. Alcohol and certain other drugs may slow your recovery and can put you at risk of further injury.  If it is harder than usual to remember things, write them down.  If you are easily distracted, try to do one thing at a time. For example, do not try to watch TV while fixing dinner.  Talk with family members or close friends when making important decisions.  Keep all follow-up appointments. Repeated evaluation of your symptoms is recommended for your recovery.  Watch your symptoms and tell others to do the same. Complications sometimes occur after a concussion. Older adults with a brain injury may have a higher risk of serious complications, such as a blood clot on the brain.  Tell your teachers, school nurse, school counselor, coach, athletic trainer, or work Freight forwarder about your injury, symptoms, and restrictions. Tell them about what you can or cannot do. They should watch for:  Increased problems with attention or concentration.  Increased difficulty remembering  or learning new information.  Increased time needed to complete tasks or assignments.  Increased irritability or decreased ability to cope with stress.  Increased symptoms.  Rest. Rest helps the brain to heal. Make sure you:  Get plenty of sleep at night. Avoid staying up late at night.  Keep the same bedtime hours on weekends and weekdays.  Rest during the day. Take daytime naps or rest breaks when you feel tired.  Limit activities that require a lot of thought or concentration. These include:  Doing homework or job-related work.  Watching TV.  Working on the computer.  Avoid any situation where there is potential for another head injury (football, hockey, soccer, basketball, martial arts, downhill snow sports and horseback riding). Your condition will get worse every time you experience a concussion. You should avoid these activities until you are evaluated by the appropriate follow-up health care providers. Returning To Your Regular Activities You will need to return to your normal activities slowly, not all at once. You must give your body and brain enough time for recovery.  Do not return to sports or other athletic activities until your health care provider tells you it is safe to do so.  Ask your health care provider when you can drive, ride  a bicycle, or operate heavy machinery. Your ability to react may be slower after a brain injury. Never do these activities if you are dizzy.  Ask your health care provider about when you can return to work or school. Preventing Another Concussion It is very important to avoid another brain injury, especially before you have recovered. In rare cases, another injury can lead to permanent brain damage, brain swelling, or death. The risk of this is greatest during the first 7-10 days after a head injury. Avoid injuries by:  Wearing a seat belt when riding in a car.  Drinking alcohol only in moderation.  Wearing a helmet when biking,  skiing, skateboarding, skating, or doing similar activities.  Avoiding activities that could lead to a second concussion, such as contact or recreational sports, until your health care provider says it is okay.  Taking safety measures in your home.  Remove clutter and tripping hazards from floors and stairways.  Use grab bars in bathrooms and handrails by stairs.  Place non-slip mats on floors and in bathtubs.  Improve lighting in dim areas. SEEK MEDICAL CARE IF:  You have increased problems paying attention or concentrating.  You have increased difficulty remembering or learning new information.  You need more time to complete tasks or assignments than before.  You have increased irritability or decreased ability to cope with stress.  You have more symptoms than before. Seek medical care if you have any of the following symptoms for more than 2 weeks after your injury:  Lasting (chronic) headaches.  Dizziness or balance problems.  Nausea.  Vision problems.  Increased sensitivity to noise or light.  Depression or mood swings.  Anxiety or irritability.  Memory problems.  Difficulty concentrating or paying attention.  Sleep problems.  Feeling tired all the time. SEEK IMMEDIATE MEDICAL CARE IF:  You have severe or worsening headaches. These may be a sign of a blood clot in the brain.  You have weakness (even if only in one hand, leg, or part of the face).  You have numbness.  You have decreased coordination.  You vomit repeatedly.  You have increased sleepiness.  One pupil is larger than the other.  You have convulsions.  You have slurred speech.  You have increased confusion. This may be a sign of a blood clot in the brain.  You have increased restlessness, agitation, or irritability.  You are unable to recognize people or places.  You have neck pain.  It is difficult to wake you up.  You have unusual behavior changes.  You lose  consciousness. MAKE SURE YOU:  Understand these instructions.  Will watch your condition.  Will get help right away if you are not doing well or get worse.   This information is not intended to replace advice given to you by your health care provider. Make sure you discuss any questions you have with your health care provider.   Document Released: 10/14/2003 Document Revised: 08/14/2014 Document Reviewed: 02/13/2013 Elsevier Interactive Patient Education [employer].

## 2015-12-12 NOTE — ED Notes (Signed)
She is awake, alert and oriented x 4 with clear speech.  She can move all extremities with ease on command.  She also mentions having "chronic" left calf pain r/t known hx of dvt.  She c/o nausea, but has not vomited.

## 2015-12-12 NOTE — ED Provider Notes (Signed)
CSN: [id number]     Arrival date & time 12/12/15  1418 History   First MD Initiated Contact with Patient 12/12/15 1435     Chief Complaint  Patient presents with  . Fall     (Consider location/radiation/quality/duration/timing/severity/associated sxs/prior Treatment) HPI 81 year old female who presents after mechanical fall. She has a history of DVT on Coumadin, hypertension, hyperlipidemia, seizure disorder, and CAD. States that yesterday evening around 7-8 PM she tripped on something that was on the floor of her closet and she fell backwards. States that she did hit her head on something hard, which she suspects was a picture frame on the ground. She thinks that she may have loss consciousness with this. I was able to get up and ambulate after her injury. She woke up this morning with posterior headache and nausea. Also complaining of neck pain, left hip and knee pain, and left shoulder pain. She denies any focal numbness or weakness, vision or speech changes, abdominal pain, chest pain or difficulty breathing.  Past Medical History  Diagnosis Date  . Osteoporosis, senile   . Elevated cholesterol   . Cancer of breast, female ([place])     1998  . History of blood clots     sees Dr. Wynonia Lawman  . Complication of anesthesia     "strong pain meds etc, causes dizziness"  . Hypertension     sees Dr. Marton Redwood  . Depression   . Anxiety   . Pneumonia     "bilaterally pneumonia, hospitalized 15 years ago"  . Stroke ([place])     hx of blood clots  . Urinary tract infection     hx of  . H/O hiatal hernia     "had surgery past hx"  . Headache(784.0)     "occas"  . Arthritis   . Anemia     hx of  . Irritable bowel syndrome     hx of  . Peptic ulcer disease     hx of  . Coronary artery disease     sees Dr. Wynonia Lawman  . Epilepsy [facility])     sees Dr. Erling Cruz  . Seizures ([place])   . Colon polyps 04/05/2010    Tubular adenomatous polyps  . GERD (gastroesophageal reflux disease) 04/05/2010  .  Dizziness and giddiness   . Alzheimer disease   . Diverticulosis   . Esophageal stricture    Past Surgical History  Procedure Laterality Date  . Cholecystectomy, laparoscopic    . Rotator cuff repair Left   . Hernia repair    . Cholecystectomy    . Eye surgery      bilateral cataract surgery  . Surgery to uterus      "patient unsure of term, states not fibroids"  . Breast surgery Left     lumpectomy  . Dilation and curettage of uterus    . Cardiac catheterization    . Artery biopsy Left 10/01/2012    Procedure: LEFT TEMPORAL ARTERY BIOPSY WITH ULTRASOUND PROBE;  Surgeon: Jerrell Belfast, MD;  Location: [facility];  Service: ENT;  Laterality: Left;  . Breast lumpectomy Right     x 2  . Nissen fundoplication    . Colonoscopy     Family History  Problem Relation Age of Onset  . Heart disease Mother   . Cancer Sister     leukemia  . COPD Father   . Heart attack Father   . Breast cancer Sister   . Colon polyps Mother   . Colon  polyps Sister   . Irritable bowel syndrome Sister    Social History  Substance Use Topics  . Smoking status: Never Smoker   . Smokeless tobacco: Never Used  . Alcohol Use: No   OB History    No data available     Review of Systems 10/14 systems reviewed and are negative other than those stated in the HPI    Allergies  Iohexol and Sulfonamide derivatives  Home Medications   Prior to Admission medications   Medication Sig Start Date End Date Taking? Authorizing Provider  acetaminophen (TYLENOL) 500 MG tablet Take 500 mg by mouth every 6 (six) hours as needed for pain.    Historical Provider, MD  ALPRAZolam Duanne Moron) 0.5 MG tablet Take 0.5 mg by mouth 2 (two) times daily as needed for anxiety.  03/04/12   Historical Provider, MD  amoxicillin-clavulanate (AUGMENTIN) 875-125 MG tablet  08/23/15   Historical Provider, MD  aspirin 81 MG tablet Take 81 mg by mouth daily.    Historical Provider, MD  atorvastatin (LIPITOR) 40 MG tablet  06/09/15    Historical Provider, MD  Calcium Citrate-Vitamin D (CITRACAL + D PO) Take 2 tablets by mouth daily.    Historical Provider, MD  cephALEXin (KEFLEX) 500 MG capsule Take 1 capsule (500 mg total) by mouth 3 (three) times daily. 11/01/15   Landis Martins, DPM  CRESTOR 40 MG tablet  01/19/14   Historical Provider, MD  lamoTRIgine (LAMICTAL) 100 MG tablet 1/2 tab in am and one tab every night 04/02/13   Marcial Pacas, MD  lisinopril-hydrochlorothiazide (PRINZIDE,ZESTORETIC) 20-12.5 MG per tablet Take 1 tablet by mouth daily.  05/06/12   Historical Provider, MD  metoprolol tartrate (LOPRESSOR) 25 MG tablet Take 25 mg by mouth 2 (two) times daily.  04/23/12   Historical Provider, MD  metroNIDAZOLE (FLAGYL) 250 MG tablet Take 1 tablet (250 mg total) by mouth 4 (four) times daily. 04/08/14   Amy S Esterwood, PA-C  Multiple Vitamin (MULTIVITAMIN WITH MINERALS) TABS Take 1 tablet by mouth daily.    Historical Provider, MD  NEOMYCIN-POLYMYXIN-HYDROCORTISONE (CORTISPORIN) 1 % SOLN otic solution Apply 1-2 drops to toe BID after soaking 07/27/15   Max T Hyatt, DPM  rivastigmine (EXELON) 4.6 mg/24hr  07/22/15   Historical Provider, MD  saccharomyces boulardii (FLORASTOR) 250 MG capsule Take 1 capsule (250 mg total) by mouth 2 (two) times daily. 04/08/14   Amy S Esterwood, PA-C  sertraline (ZOLOFT) 100 MG tablet Take 100 mg by mouth daily.  04/23/12   Historical Provider, MD  valsartan-hydrochlorothiazide (DIOVAN-HCT) 160-12.5 MG per tablet Take 12.5 tablets by mouth daily. 09/23/13   Historical Provider, MD  warfarin (COUMADIN) 2.5 MG tablet  08/31/15   Historical Provider, MD  warfarin (COUMADIN) 5 MG tablet Take 5-7.5 mg by mouth daily. Take one tablet (5 mg) on Mondays, Tuesdays, Wednesday, Fridays and Sauturdays. Take one and one-half tablets (7.5 mg) on Thursdays and Sundays. 04/23/12   Historical Provider, MD   BP 135/51 mmHg  Pulse 54  Temp(Src) 97.6 F (36.4 C) (Oral)  Resp 15  SpO2 96% Physical Exam Physical Exam   Nursing note and vitals reviewed. Constitutional: Well developed, well nourished, non-toxic, and in no acute distress Head: Normocephalic and atraumatic.  Mouth/Throat: Oropharynx is clear and moist.  Neck: Normal range of motion. Neck supple. Generalized cervical spine tenderness without step-offs or deformities. Cardiovascular: Normal rate and regular rhythm.   Pulmonary/Chest: Effort normal and breath sounds normal. No significant chest wall tenderness. Abdominal: Soft. There  is no tenderness. There is no rebound and no guarding.  Musculoskeletal: Mild swelling of the left shoulder with limited range of motion due to pain. Range of motion intact in all other 3 extremities. left knee pain with range of motion. No TLS spine tenderness.  Neurological: Alert, no facial droop, fluent speech, moves all extremities symmetrically, sensation to light touch in tact, PERRL, EOMI Skin: Skin is warm and dry.  Psychiatric: Cooperative  ED Course  Procedures (including critical care time) Labs Review Labs Reviewed  CBC - Abnormal; Notable for the following:    RBC 3.73 (*)    Hemoglobin 11.2 (*)    HCT 33.8 (*)    All other components within normal limits  BASIC METABOLIC PANEL - Abnormal; Notable for the following:    Glucose, Bld 114 (*)    BUN 21 (*)    Creatinine, Ser 1.29 (*)    Calcium 8.7 (*)    GFR calc non Af Amer 39 (*)    GFR calc Af Amer 45 (*)    All other components within normal limits  PROTIME-INR - Abnormal; Notable for the following:    Prothrombin Time 26.7 (*)    INR 2.61 (*)    All other components within normal limits    Imaging Review Dg Chest 1 View  12/12/2015  CLINICAL DATA:  Pain following fall EXAM: CHEST 1 VIEW COMPARISON:  September 27, 2012 FINDINGS: There is no edema or consolidation. Heart is upper normal in size with pulmonary vascularity within normal limits. There are surgical clips over the left chest and axilla as well as upper abdominal regions. No  demonstrable adenopathy. No fracture evident. No pneumothorax. There is mild rightward deviation of the upper thoracic esophagus. IMPRESSION: No edema or consolidation. Stable cardiac silhouette. Mild rightward deviation of the upper thoracic esophagus. Question thyroid enlargement in this area. Electronically Signed   By: Lowella Grip III M.D.   On: 12/12/2015 15:58   Ct Head Wo Contrast  12/12/2015  CLINICAL DATA:  [place] last night bedroom onto LEFT side into closet, LEFT facial pain, headache, on Coumadin, history stroke, seizures, Alzheimer's, breast cancer, hypertension neck field EXAM: CT HEAD WITHOUT CONTRAST CT CERVICAL SPINE WITHOUT CONTRAST TECHNIQUE: Multidetector CT imaging of the head and cervical spine was performed following the standard protocol without intravenous contrast. Multiplanar CT image reconstructions of the cervical spine were also generated. COMPARISON:  None. FINDINGS: CT HEAD FINDINGS Generalized atrophy. Normal ventricular morphology. No midline shift or mass effect. Mild small vessel chronic ischemic changes of deep cerebral white matter. No intracranial hemorrhage, mass lesion, or evidence acute infarction. No extra-axial fluid collections. Visualized paranasal sinuses and mastoid air cells clear. Degenerative changes BILATERAL temporomandibular joints. No acute calvarial abnormalities. CT CERVICAL SPINE FINDINGS Bones appear demineralized. Prevertebral soft tissues normal thickness. Mild disc space narrowing C5-C6 and C6-C7, less at remaining levels. Vertebral body heights maintained. No acute fracture, subluxation, or bone destruction. Multilevel facet degenerative changes. Visualized skullbase intact. Lung apices clear. IMPRESSION: Atrophy with mild small vessel chronic ischemic changes of deep cerebral white matter. No acute intracranial abnormalities. Osseous demineralization with mild degenerative disc and facet disease changes of the cervical spine. No acute cervical  spine abnormalities. Electronically Signed   By: Lavonia  M.D.   On: 12/12/2015 16:07   Ct Cervical Spine Wo Contrast  12/12/2015  CLINICAL DATA:  [place] last night bedroom onto LEFT side into closet, LEFT facial pain, headache, on Coumadin, history stroke, seizures, Alzheimer's, breast cancer,  hypertension neck field EXAM: CT HEAD WITHOUT CONTRAST CT CERVICAL SPINE WITHOUT CONTRAST TECHNIQUE: Multidetector CT imaging of the head and cervical spine was performed following the standard protocol without intravenous contrast. Multiplanar CT image reconstructions of the cervical spine were also generated. COMPARISON:  None. FINDINGS: CT HEAD FINDINGS Generalized atrophy. Normal ventricular morphology. No midline shift or mass effect. Mild small vessel chronic ischemic changes of deep cerebral white matter. No intracranial hemorrhage, mass lesion, or evidence acute infarction. No extra-axial fluid collections. Visualized paranasal sinuses and mastoid air cells clear. Degenerative changes BILATERAL temporomandibular joints. No acute calvarial abnormalities. CT CERVICAL SPINE FINDINGS Bones appear demineralized. Prevertebral soft tissues normal thickness. Mild disc space narrowing C5-C6 and C6-C7, less at remaining levels. Vertebral body heights maintained. No acute fracture, subluxation, or bone destruction. Multilevel facet degenerative changes. Visualized skullbase intact. Lung apices clear. IMPRESSION: Atrophy with mild small vessel chronic ischemic changes of deep cerebral white matter. No acute intracranial abnormalities. Osseous demineralization with mild degenerative disc and facet disease changes of the cervical spine. No acute cervical spine abnormalities. Electronically Signed   By: Lavonia  M.D.   On: 12/12/2015 16:07   Dg Shoulder Left  12/12/2015  CLINICAL DATA:  [place] last evening. Left hip, left shoulder and left knee pain. EXAM: LEFT SHOULDER - 2+ VIEW; LEFT KNEE - COMPLETE 4+ VIEW; DG HIP (WITH OR  WITHOUT PELVIS) 2-3V LEFT COMPARISON:  None. FINDINGS: Pelvis/left hip: Both hips are normally located. No acute fracture or plain film evidence of AVN. The pubic symphysis and SI joints are intact. No pelvic fractures. Left knee: The joint spaces are fairly well maintained. Minimal degenerative changes. No acute fracture or osteochondral abnormality. No joint effusion. Left shoulder: No fracture or dislocation. Mild AC joint degenerative changes. The visualized left lung is clear. IMPRESSION: No acute bony findings. Electronically Signed   By: Marijo Sanes M.D.   On: 12/12/2015 15:58   Dg Knee Complete 4 Views Left  12/12/2015  CLINICAL DATA:  [place] last evening. Left hip, left shoulder and left knee pain. EXAM: LEFT SHOULDER - 2+ VIEW; LEFT KNEE - COMPLETE 4+ VIEW; DG HIP (WITH OR WITHOUT PELVIS) 2-3V LEFT COMPARISON:  None. FINDINGS: Pelvis/left hip: Both hips are normally located. No acute fracture or plain film evidence of AVN. The pubic symphysis and SI joints are intact. No pelvic fractures. Left knee: The joint spaces are fairly well maintained. Minimal degenerative changes. No acute fracture or osteochondral abnormality. No joint effusion. Left shoulder: No fracture or dislocation. Mild AC joint degenerative changes. The visualized left lung is clear. IMPRESSION: No acute bony findings. Electronically Signed   By: Marijo Sanes M.D.   On: 12/12/2015 15:58   Dg Hip Unilat With Pelvis 2-3 Views Left  12/12/2015  CLINICAL DATA:  [place] last evening. Left hip, left shoulder and left knee pain. EXAM: LEFT SHOULDER - 2+ VIEW; LEFT KNEE - COMPLETE 4+ VIEW; DG HIP (WITH OR WITHOUT PELVIS) 2-3V LEFT COMPARISON:  None. FINDINGS: Pelvis/left hip: Both hips are normally located. No acute fracture or plain film evidence of AVN. The pubic symphysis and SI joints are intact. No pelvic fractures. Left knee: The joint spaces are fairly well maintained. Minimal degenerative changes. No acute fracture or osteochondral  abnormality. No joint effusion. Left shoulder: No fracture or dislocation. Mild AC joint degenerative changes. The visualized left lung is clear. IMPRESSION: No acute bony findings. Electronically Signed   By: Marijo Sanes M.D.   On: 12/12/2015 15:58   I  have personally reviewed and evaluated these images and lab results as part of my medical decision-making.   EKG Interpretation   Date/Time:  Sunday Dec 12 2015 14:46:07 EDT Ventricular Rate:  51 PR Interval:  207 QRS Duration: 100 QT Interval:  486 QTC Calculation: 448 R Axis:   68 Text Interpretation:  Sinus rhythm Abnormal R-wave progression, early  transition No significant change since last tracing Confirmed by Oren Barella MD,  Jakel Alphin [phone]) on 12/12/2015 3:02:02 PM      MDM   Final diagnoses:  Concussion, with loss of consciousness of 30 minutes or less, initial encounter  Head injury, initial encounter    81 year old female with history of DVT on Coumadin who presents with headache and nausea after mechanical fall with head strike yesterday evening. She on presentation is nontoxic in no acute distress. She is neurologically intact. No acute deformities are noted on exam to suggest severe injury but she does have left shoulder pain, left knee pain, and mild left hip pain with range of motion. Abdomen is benign and cardiopulmonary exam is unremarkable. CT head and cervical spine  performed given age and Coumadin usage. Imaging studies visualized and shows no acute intra-cranial process or cervical spine injury. X-rays of the chest, pelvis, hip, knee and shoulder are also performed visualized and shows no acute traumatic injuries. Given Tylenol for pain control to good effect. She has been able to tolerate by mouth intake and ambulates with steady gait here in the emergency department. At this time she is felt to be stable for discharge home. Supportive care for home reviewed. Strict return and follow-up instructions are reviewed. She expressed  understanding of all discharge instructions and felt comfortable to plan of care.   Forde Dandy, MD 12/12/15 [phone]

## 2015-12-24 ENCOUNTER — Encounter: Payer: Self-pay | Admitting: Podiatry

## 2015-12-24 ENCOUNTER — Ambulatory Visit (INDEPENDENT_AMBULATORY_CARE_PROVIDER_SITE_OTHER): Payer: Medicare HMO | Admitting: Podiatry

## 2015-12-24 VITALS — BP 105/47 | HR 55 | Resp 16

## 2015-12-24 DIAGNOSIS — M79671 Pain in right foot: Secondary | ICD-10-CM

## 2015-12-24 DIAGNOSIS — Z9889 Other specified postprocedural states: Secondary | ICD-10-CM

## 2015-12-24 NOTE — Progress Notes (Signed)
Subjective:     Patient ID: [name], female   DOB: 02-Feb-1935, 81 y.o.   MRN: [id number]  HPI this patient presents to the office with chief complaint of nail present. After having nail surgery performed on her right big toenail. She believes the nail was present and she pulled on the nail the other night and it started to bleed. She is concerned about the presence of this nail that is present at this time. She desires the surgical site be examined and treated   Review of Systems     Objective:   Physical Exam GENERAL APPEARANCE: Alert, conversant. Appropriately groomed. No acute distress.  VASCULAR: Pedal pulses are  palpable at  [facility] and PT bilateral.  Capillary refill time is immediate to all digits,  Normal temperature gradient.  Digital hair growth is present bilateral  NEUROLOGIC: sensation is normal to 5.07 monofilament at 5/5 sites bilateral.  Light touch is intact bilateral, Muscle strength normal.  MUSCULOSKELETAL: acceptable muscle strength, tone and stability bilateral.  Intrinsic muscluature intact bilateral.  Rectus appearance of foot and digits noted bilateral.   DERMATOLOGIC: skin color, texture, and turgor are within normal limits.  No preulcerative lesions or ulcers  are seen, no interdigital maceration noted.  No open lesions present.  . No drainage noted.There is dry crusty area with minimal nail noted at the base right nail bed.  .  No  evidence of redness or swelling right hallux.      Assessment:     S/p nail surgery.    Plan:     ROV.  Debride crust with dremel tool.   RTC prn   Gardiner Barefoot DPM

## 2016-01-06 ENCOUNTER — Encounter: Payer: Self-pay | Admitting: Internal Medicine

## 2016-03-03 ENCOUNTER — Other Ambulatory Visit (HOSPITAL_COMMUNITY): Payer: Self-pay | Admitting: Internal Medicine

## 2016-03-03 DIAGNOSIS — C787 Secondary malignant neoplasm of liver and intrahepatic bile duct: Secondary | ICD-10-CM

## 2016-03-07 ENCOUNTER — Inpatient Hospital Stay
Admission: RE | Admit: 2016-03-07 | Discharge: 2016-03-07 | Disposition: A | Discharge status: Home / Self Care | Payer: Self-pay | Source: Ambulatory Visit | Attending: *Deleted | Admitting: *Deleted

## 2016-03-07 ENCOUNTER — Other Ambulatory Visit (HOSPITAL_COMMUNITY): Payer: Self-pay | Admitting: *Deleted

## 2016-03-07 ENCOUNTER — Other Ambulatory Visit (HOSPITAL_COMMUNITY): Payer: Self-pay | Admitting: Internal Medicine

## 2016-03-07 DIAGNOSIS — R52 Pain, unspecified: Secondary | ICD-10-CM

## 2016-03-07 DIAGNOSIS — C787 Secondary malignant neoplasm of liver and intrahepatic bile duct: Secondary | ICD-10-CM

## 2016-03-09 ENCOUNTER — Other Ambulatory Visit: Payer: Self-pay | Admitting: Physician Assistant

## 2016-03-09 ENCOUNTER — Other Ambulatory Visit: Payer: Self-pay | Admitting: Radiology

## 2016-03-10 ENCOUNTER — Ambulatory Visit (HOSPITAL_COMMUNITY)
Admission: RE | Admit: 2016-03-10 | Discharge: 2016-03-10 | Disposition: A | Discharge status: Home / Self Care | Payer: Medicare HMO | Source: Ambulatory Visit | Attending: Internal Medicine | Admitting: Internal Medicine

## 2016-03-10 ENCOUNTER — Encounter (HOSPITAL_COMMUNITY): Payer: Self-pay

## 2016-03-10 DIAGNOSIS — F418 Other specified anxiety disorders: Secondary | ICD-10-CM | POA: Diagnosis not present

## 2016-03-10 DIAGNOSIS — Z86010 Personal history of colonic polyps: Secondary | ICD-10-CM | POA: Diagnosis not present

## 2016-03-10 DIAGNOSIS — M810 Age-related osteoporosis without current pathological fracture: Secondary | ICD-10-CM | POA: Diagnosis not present

## 2016-03-10 DIAGNOSIS — Z9049 Acquired absence of other specified parts of digestive tract: Secondary | ICD-10-CM | POA: Diagnosis not present

## 2016-03-10 DIAGNOSIS — G309 Alzheimer's disease, unspecified: Secondary | ICD-10-CM | POA: Insufficient documentation

## 2016-03-10 DIAGNOSIS — K449 Diaphragmatic hernia without obstruction or gangrene: Secondary | ICD-10-CM | POA: Diagnosis not present

## 2016-03-10 DIAGNOSIS — Z8711 Personal history of peptic ulcer disease: Secondary | ICD-10-CM | POA: Insufficient documentation

## 2016-03-10 DIAGNOSIS — C220 Liver cell carcinoma: Secondary | ICD-10-CM | POA: Insufficient documentation

## 2016-03-10 DIAGNOSIS — K589 Irritable bowel syndrome without diarrhea: Secondary | ICD-10-CM | POA: Diagnosis not present

## 2016-03-10 DIAGNOSIS — I2510 Atherosclerotic heart disease of native coronary artery without angina pectoris: Secondary | ICD-10-CM | POA: Diagnosis not present

## 2016-03-10 DIAGNOSIS — Z8673 Personal history of transient ischemic attack (TIA), and cerebral infarction without residual deficits: Secondary | ICD-10-CM | POA: Insufficient documentation

## 2016-03-10 DIAGNOSIS — Z853 Personal history of malignant neoplasm of breast: Secondary | ICD-10-CM | POA: Diagnosis not present

## 2016-03-10 DIAGNOSIS — K5790 Diverticulosis of intestine, part unspecified, without perforation or abscess without bleeding: Secondary | ICD-10-CM | POA: Diagnosis not present

## 2016-03-10 DIAGNOSIS — C787 Secondary malignant neoplasm of liver and intrahepatic bile duct: Secondary | ICD-10-CM | POA: Insufficient documentation

## 2016-03-10 DIAGNOSIS — Z87440 Personal history of urinary (tract) infections: Secondary | ICD-10-CM | POA: Insufficient documentation

## 2016-03-10 DIAGNOSIS — K219 Gastro-esophageal reflux disease without esophagitis: Secondary | ICD-10-CM | POA: Diagnosis not present

## 2016-03-10 DIAGNOSIS — E7800 Pure hypercholesterolemia, unspecified: Secondary | ICD-10-CM | POA: Diagnosis not present

## 2016-03-10 DIAGNOSIS — G40909 Epilepsy, unspecified, not intractable, without status epilepticus: Secondary | ICD-10-CM | POA: Insufficient documentation

## 2016-03-10 DIAGNOSIS — K769 Liver disease, unspecified: Secondary | ICD-10-CM | POA: Insufficient documentation

## 2016-03-10 DIAGNOSIS — Z888 Allergy status to other drugs, medicaments and biological substances status: Secondary | ICD-10-CM | POA: Diagnosis not present

## 2016-03-10 DIAGNOSIS — D649 Anemia, unspecified: Secondary | ICD-10-CM | POA: Diagnosis not present

## 2016-03-10 DIAGNOSIS — Z8249 Family history of ischemic heart disease and other diseases of the circulatory system: Secondary | ICD-10-CM | POA: Insufficient documentation

## 2016-03-10 DIAGNOSIS — Z7901 Long term (current) use of anticoagulants: Secondary | ICD-10-CM | POA: Insufficient documentation

## 2016-03-10 DIAGNOSIS — Z836 Family history of other diseases of the respiratory system: Secondary | ICD-10-CM | POA: Insufficient documentation

## 2016-03-10 DIAGNOSIS — F0280 Dementia in other diseases classified elsewhere without behavioral disturbance: Secondary | ICD-10-CM | POA: Diagnosis not present

## 2016-03-10 DIAGNOSIS — M1990 Unspecified osteoarthritis, unspecified site: Secondary | ICD-10-CM | POA: Insufficient documentation

## 2016-03-10 DIAGNOSIS — I10 Essential (primary) hypertension: Secondary | ICD-10-CM | POA: Diagnosis not present

## 2016-03-10 DIAGNOSIS — Z882 Allergy status to sulfonamides status: Secondary | ICD-10-CM | POA: Diagnosis not present

## 2016-03-10 DIAGNOSIS — Z806 Family history of leukemia: Secondary | ICD-10-CM | POA: Insufficient documentation

## 2016-03-10 DIAGNOSIS — Z8701 Personal history of pneumonia (recurrent): Secondary | ICD-10-CM | POA: Insufficient documentation

## 2016-03-10 DIAGNOSIS — Z803 Family history of malignant neoplasm of breast: Secondary | ICD-10-CM | POA: Insufficient documentation

## 2016-03-10 DIAGNOSIS — Z8371 Family history of colonic polyps: Secondary | ICD-10-CM | POA: Insufficient documentation

## 2016-03-10 LAB — CBC
HEMATOCRIT: 32.8 % — AB (ref 36.0–46.0)
HEMOGLOBIN: 10.7 g/dL — AB (ref 12.0–15.0)
MCH: 30.9 pg (ref 26.0–34.0)
MCHC: 32.6 g/dL (ref 30.0–36.0)
MCV: 94.8 fL (ref 78.0–100.0)
Platelets: 305 10*3/uL (ref 150–400)
RBC: 3.46 MIL/uL — AB (ref 3.87–5.11)
RDW: 14.6 % (ref 11.5–15.5)
WBC: 7 10*3/uL (ref 4.0–10.5)

## 2016-03-10 LAB — PROTIME-INR
INR: 0.92
Prothrombin Time: 12.3 seconds (ref 11.4–15.2)

## 2016-03-10 LAB — APTT: aPTT: 26 seconds (ref 24–36)

## 2016-03-10 MED ORDER — FENTANYL CITRATE (PF) 100 MCG/2ML IJ SOLN
INTRAMUSCULAR | Status: AC | PRN
  Administered 2016-03-10: 50 ug via INTRAVENOUS

## 2016-03-10 MED ORDER — FENTANYL CITRATE (PF) 100 MCG/2ML IJ SOLN
INTRAMUSCULAR | Status: AC
  Filled 2016-03-10: qty 2

## 2016-03-10 MED ORDER — HYDROCODONE-ACETAMINOPHEN 5-325 MG PO TABS: 1.0000 | ORAL_TABLET | ORAL | Status: DC | PRN

## 2016-03-10 MED ORDER — SODIUM CHLORIDE 0.9 % IV SOLN
INTRAVENOUS | Status: DC
  Administered 2016-03-10: 12:00:00 via INTRAVENOUS

## 2016-03-10 MED ORDER — MIDAZOLAM HCL 2 MG/2ML IJ SOLN
INTRAMUSCULAR | Status: AC
  Filled 2016-03-10: qty 4

## 2016-03-10 MED ORDER — MIDAZOLAM HCL 2 MG/2ML IJ SOLN
INTRAMUSCULAR | Status: AC | PRN
  Administered 2016-03-10: 1 mg via INTRAVENOUS

## 2016-03-10 NOTE — Procedures (Signed)
US guided core biopsy (4) of a right hepatic lesion.  Additional lesions identified.  Minimal blood loss and no immediate complication.  See full report in PACS.

## 2016-03-10 NOTE — Discharge Instructions (Addendum)
Liver Biopsy, Care After °These instructions give you information on caring for yourself after your procedure. Your doctor may also give you more specific instructions. Call your doctor if you have any problems or questions after your procedure. °HOME CARE °· Rest at home for 1-2 days or as told by your doctor. °· Have someone stay with you for at least 24 hours. °· Do not do these things in the first 24 hours: °· Drive. °· Use machinery. °· Take care of other people. °· Sign legal documents. °· Take a bath or shower. °· There are many different ways to close and cover a cut (incision). For example, a cut can be closed with stitches, skin glue, or adhesive strips. Follow your doctor's instructions on: °· Taking care of your cut. °· Changing and removing your bandage (dressing). °· Removing whatever was used to close your cut. °· Do not drink alcohol in the first week. °· Do not lift more than 5 pounds or play contact sports for the first 2 weeks. °· Take medicines only as told by your doctor. For 1 week, do not take medicine that has aspirin in it or medicines like ibuprofen. °· Get your test results. °GET HELP IF: °· A cut bleeds and leaves more than just a small spot of blood. °· A cut is red, puffs up (swells), or hurts more than before. °· Fluid or something else comes from a cut. °· A cut smells bad. °· You have a fever or chills. °GET HELP RIGHT AWAY IF: °· You have swelling, bloating, or pain in your belly (abdomen). °· You get dizzy or faint. °· You have a rash. °· You feel sick to your stomach (nauseous) or throw up (vomit). °· You have trouble breathing, feel short of breath, or feel faint. °· Your chest hurts. °· You have problems talking or seeing. °· You have trouble balancing or moving your arms or legs. °  °This information is not intended to replace advice given to you by your health care provider. Make sure you discuss any questions you have with your health care provider. °  °Document Released:  05/02/2008 Document Revised: 08/14/2014 Document Reviewed: 09/19/2013 °Elsevier Interactive Patient Education ©2016 [employer] ° ° °Moderate Conscious Sedation, Adult, Care After °Refer to this sheet in the next few weeks. These instructions provide you with information on caring for yourself after your procedure. Your health care provider may also give you more specific instructions. Your treatment has been planned according to current medical practices, but problems sometimes occur. Call your health care provider if you have any problems or questions after your procedure. °WHAT TO EXPECT AFTER THE PROCEDURE  °After your procedure: °· You may feel sleepy, clumsy, and have poor balance for several hours. °· Vomiting may occur if you eat too soon after the procedure. °HOME CARE INSTRUCTIONS °· Do not participate in any activities where you could become injured for at least 24 hours. Do not: °¨ Drive. °¨ Swim. °¨ Ride a bicycle. °¨ Operate heavy machinery. °¨ Cook. °¨ Use power tools. °¨ Climb ladders. °¨ Work from a high place. °· Do not make important decisions or sign legal documents until you are improved. °· If you vomit, drink water, juice, or soup when you can drink without vomiting. Make sure you have little or no nausea before eating solid foods. °· Only take over-the-counter or prescription medicines for pain, discomfort, or fever as directed by your health care provider. °· Make sure you and your   family fully understand everything about the medicines given to you, including what side effects may occur. °· You should not drink alcohol, take sleeping pills, or take medicines that cause drowsiness for at least 24 hours. °· If you smoke, do not smoke without supervision. °· If you are feeling better, you may resume normal activities 24 hours after you were sedated. °· Keep all appointments with your health care provider. °SEEK MEDICAL CARE IF: °· Your skin is pale or bluish in color. °· You continue to feel  nauseous or vomit. °· Your pain is getting worse and is not helped by medicine. °· You have bleeding or swelling. °· You are still sleepy or feeling clumsy after 24 hours. °SEEK IMMEDIATE MEDICAL CARE IF: °· You develop a rash. °· You have difficulty breathing. °· You develop any type of allergic problem. °· You have a fever. °MAKE SURE YOU: °· Understand these instructions. °· Will watch your condition. °· Will get help right away if you are not doing well or get worse. °  °This information is not intended to replace advice given to you by your health care provider. Make sure you discuss any questions you have with your health care provider. °  °Document Released: 05/14/2013 Document Revised: 08/14/2014 Document Reviewed: 05/14/2013 °Elsevier Interactive Patient Education ©2016 [employer] ° °

## 2016-03-10 NOTE — H&P (Signed)
Chief Complaint: liver lesions  Referring Physician:Dr. Gwyndolyn Saxon Marden Noble) Brigitte Pulse  Supervising Physician: Markus Daft  Patient Status: Out-pt  HPI: [name] is an 81 y.o. female who had a fall in May with a few rib fractures.  She has continued to have some falls but also some RUQ pain and some pain in her chest.  She saw Dr. Brigitte Pulse who sent her for a CXR and thought something was seen.  She then went for a CT scan of her chest.  This revealed nodules in the lungs as well as lesions in her liver.  The patient does complain of occasional hemoptysis.  She does take coumadin for a DVT.  Her coumadin has been on hold since last week for this procedure.  She presents today for a biopsy of a liver lesion.  Past Medical History:  Past Medical History:  Diagnosis Date  . Alzheimer disease   . Anemia    hx of  . Anxiety   . Arthritis   . Cancer of breast, female ([place])    1998  . Colon polyps 04/05/2010   Tubular adenomatous polyps  . Complication of anesthesia    "strong pain meds etc, causes dizziness"  . Coronary artery disease    sees Dr. Wynonia Lawman  . Depression   . Diverticulosis   . Dizziness and giddiness   . Elevated cholesterol   . Epilepsy [facility])    sees Dr. Erling Cruz  . Esophageal stricture   . GERD (gastroesophageal reflux disease) 04/05/2010  . H/O hiatal hernia    "had surgery past hx"  . Headache(784.0)    "occas"  . History of blood clots    sees Dr. Wynonia Lawman  . Hypertension    sees Dr. Marton Redwood  . Irritable bowel syndrome    hx of  . Osteoporosis, senile   . Peptic ulcer disease    hx of  . Pneumonia    "bilaterally pneumonia, hospitalized 15 years ago"  . Seizures ([place])   . Stroke ([place])    hx of blood clots  . Urinary tract infection    hx of    Past Surgical History:  Past Surgical History:  Procedure Laterality Date  . ARTERY BIOPSY Left 10/01/2012   Procedure: LEFT TEMPORAL ARTERY BIOPSY WITH ULTRASOUND PROBE;  Surgeon: Jerrell Belfast, MD;   Location: [place];  Service: ENT;  Laterality: Left;  . BREAST LUMPECTOMY Right    x 2  . BREAST SURGERY Left    lumpectomy  . CARDIAC CATHETERIZATION    . CHOLECYSTECTOMY    . CHOLECYSTECTOMY, LAPAROSCOPIC    . COLONOSCOPY    . DILATION AND CURETTAGE OF UTERUS    . EYE SURGERY     bilateral cataract surgery  . HERNIA REPAIR    . NISSEN FUNDOPLICATION    . ROTATOR CUFF REPAIR Left   . surgery to uterus     "patient unsure of term, states not fibroids"    Family History:  Family History  Problem Relation Age of Onset  . Heart disease Mother   . Cancer Sister     leukemia  . COPD Father   . Heart attack Father   . Breast cancer Sister   . Colon polyps Mother   . Colon polyps Sister   . Irritable bowel syndrome Sister     Social History:  reports that she has never smoked. She has never used smokeless tobacco. She reports that she does not drink alcohol or use  drugs.  Allergies:  Allergies  Allergen Reactions  . Iohexol      Code: RASH, Desc: REMOTE H/O LIP TINGLING, NUMBNESS, AND HIVES WITH IV CM- 13 HR PRE-MED GIVEN AND PATIENT TOLERATED WELL- LY, Onset Date: [id number]   . Sulfonamide Derivatives Swelling    Medications: Medications reviewed in epic  Please HPI for pertinent positives, otherwise complete 10 system ROS negative.  Mallampati Score: MD Evaluation Airway: WNL Heart: WNL Abdomen: WNL Chest/ Lungs: WNL ASA  Classification: 3 Mallampati/Airway Score: Two  Physical Exam: BP (!) 171/61   Pulse (!) 52   Temp 97.7 F (36.5 C) (Oral)   Resp 18   SpO2 95%  There is no height or weight on file to calculate BMI. General: pleasant, WD, WN white female who is laying in bed in NAD HEENT: head is normocephalic, atraumatic.  Sclera are noninjected.  PERRL.  Ears and nose without any masses or lesions.  Mouth is pink and moist Heart: regular, rate, and rhythm.  Normal s1,s2. No obvious murmurs, gallops, or rubs noted.  Palpable radial and pedal pulses  bilaterally Lungs: CTAB, no wheezes, rhonchi, or rales noted.  Respiratory effort nonlabored Abd: soft, mild RUQ tenderness, ND, +BS, no masses, hernias, or organomegaly MS: all 4 extremities are symmetrical with no cyanosis, clubbing, or edema. Psych: A&Ox3 with an appropriate affect.   Labs: Results for orders placed or performed during the hospital encounter of 03/10/16 (from the past 48 hour(s))  APTT upon arrival     Status: None   Collection Time: 03/10/16 11:05 AM  Result Value Ref Range   aPTT 26 24 - 36 seconds  CBC upon arrival     Status: Abnormal   Collection Time: 03/10/16 11:05 AM  Result Value Ref Range   WBC 7.0 4.0 - 10.5 K/uL   RBC 3.46 (L) 3.87 - 5.11 MIL/uL   Hemoglobin 10.7 (L) 12.0 - 15.0 g/dL   HCT 32.8 (L) 36.0 - 46.0 %   MCV 94.8 78.0 - 100.0 fL   MCH 30.9 26.0 - 34.0 pg   MCHC 32.6 30.0 - 36.0 g/dL   RDW 14.6 11.5 - 15.5 %   Platelets 305 150 - 400 K/uL  Protime-INR upon arrival     Status: None   Collection Time: 03/10/16 11:05 AM  Result Value Ref Range   Prothrombin Time 12.3 11.4 - 15.2 seconds   INR 0.92     Imaging: No results found.  Assessment/Plan 1. Liver lesions -we will plan for US biopsy of a liver lesion today. -her labs and vitals have been reviewed.  Her INR is normal at 0.92. -Risks and Benefits discussed with the patient including, but not limited to bleeding, infection, damage to adjacent structures or low yield requiring additional tests. All of the patient's questions were answered, patient is agreeable to proceed. Consent signed and in chart.   Thank you for this interesting consult.  I greatly enjoyed meeting [name] and look forward to participating in their care.  A copy of this report was sent to the requesting provider on this date.  Electronically Signed: Henreitta Cea 03/10/2016, 12:30 PM   I spent a total of  30 Minutes   in face to face in clinical consultation, greater than 50% of which was  counseling/coordinating care for liver lesions

## 2016-03-20 ENCOUNTER — Other Ambulatory Visit: Payer: Self-pay | Admitting: Internal Medicine

## 2016-03-20 DIAGNOSIS — C499 Malignant neoplasm of connective and soft tissue, unspecified: Secondary | ICD-10-CM

## 2016-03-21 ENCOUNTER — Ambulatory Visit
Admission: RE | Admit: 2016-03-21 | Discharge: 2016-03-21 | Disposition: A | Discharge status: Home / Self Care | Payer: Medicare HMO | Source: Ambulatory Visit | Attending: Internal Medicine | Admitting: Internal Medicine

## 2016-03-21 DIAGNOSIS — C499 Malignant neoplasm of connective and soft tissue, unspecified: Secondary | ICD-10-CM

## 2016-03-27 ENCOUNTER — Encounter: Payer: Self-pay | Admitting: Oncology

## 2016-03-27 ENCOUNTER — Telehealth: Payer: Self-pay | Admitting: Oncology

## 2016-03-27 NOTE — Telephone Encounter (Signed)
Patient's daughter, Tessie Fass, called to schedule an appointment for her. Appointment scheduled with [place] for 8/31 at 2pm. Mrs. Joneen Caraway agreed. Demographics verified. Letter mailed to the patient and faxed to the referring.

## 2016-04-06 ENCOUNTER — Ambulatory Visit (HOSPITAL_BASED_OUTPATIENT_CLINIC_OR_DEPARTMENT_OTHER): Payer: Medicare HMO | Admitting: Oncology

## 2016-04-06 VITALS — BP 138/66 | HR 57 | Temp 98.2°F | Resp 16 | Ht 61.0 in | Wt 157.5 lb

## 2016-04-06 DIAGNOSIS — C499 Malignant neoplasm of connective and soft tissue, unspecified: Secondary | ICD-10-CM

## 2016-04-06 DIAGNOSIS — G893 Neoplasm related pain (acute) (chronic): Secondary | ICD-10-CM | POA: Diagnosis not present

## 2016-04-06 DIAGNOSIS — C787 Secondary malignant neoplasm of liver and intrahepatic bile duct: Secondary | ICD-10-CM

## 2016-04-06 DIAGNOSIS — C7800 Secondary malignant neoplasm of unspecified lung: Secondary | ICD-10-CM | POA: Diagnosis not present

## 2016-04-06 MED ORDER — TRAMADOL HCL 50 MG PO TABS: 50.0000 mg | ORAL_TABLET | Freq: Four times a day (QID) | ORAL | 0 refills | Status: DC | PRN

## 2016-04-06 NOTE — Progress Notes (Signed)
Reason for Referral: Leiomyosarcoma.   HPI: 81 year old woman currently of [place] where she lives with her husband and her son. He is a woman with multiple comorbid conditions include dementia as well as coronary artery disease. She also has remote history of breast cancer and required surgery and radiation therapy. The details of which is not available to me at this time but per her report and her family recalls had a fair diagnosis was around 1998. She does not have any history of uterine issues or hysterectomy. She had a recent falls in May 2017 related to unsteadiness and she had a fractured ribs. She continued to complain of right upper quadrant pain and she was evaluated by Dr. Brigitte Pulse for primary care provider and she had imaging studies including chest x-ray and subsequently CT scan of the chest which showed nodules in her lung as well as liver lesions. A CT scan of the abdomen and pelvis on 03/21/2016 which showed multiple hepatic lesions compatible with metastatic disease. The largest lesion was measuring 3.2 cm and a dominant right hepatic lesion measuring 2.3 cm. She also has multiple lung masses noted at the base of the lung. She underwent a biopsy on 03/10/2016 which showed malignant spindle cell neoplasm. This neoplasm was positive for smooth muscle actin and Pax 8. It was negative for S100 and desmin as well as CD117, CD34. These findings suggest leiomyosarcoma and certainly not GIST. Patient referred to me for evaluation regarding these findings. She complains of right upper quadrant pain periodically as well as left upper quadrant pain. Her appetite remains reasonable and has not lost any weight. Her performance status is marginal related to her dementia and comorbid conditions but have not changed dramatically.  She does not report any headaches, blurry vision, syncope or seizures. She does not report any fevers or chills or sweats. She does not report any cough, wheezing or hemoptysis. She  does not report any nausea, vomiting she does report a dull constant abdominal pain. She does not report any frequency, urgency or hesitancy. She does not report any skeletal complaints of arthralgias or myalgias. Remaining review of systems unremarkable.   Past Medical History:  Diagnosis Date  . Alzheimer disease   . Anemia    hx of  . Anxiety   . Arthritis   . Cancer of breast, female ([place])    1998  . Colon polyps 04/05/2010   Tubular adenomatous polyps  . Complication of anesthesia    "strong pain meds etc, causes dizziness"  . Coronary artery disease    sees Dr. Wynonia Lawman  . Depression   . Diverticulosis   . Dizziness and giddiness   . Elevated cholesterol   . Epilepsy [facility])    sees Dr. Erling Cruz  . Esophageal stricture   . GERD (gastroesophageal reflux disease) 04/05/2010  . H/O hiatal hernia    "had surgery past hx"  . Headache(784.0)    "occas"  . History of blood clots    sees Dr. Wynonia Lawman  . Hypertension    sees Dr. Marton Redwood  . Irritable bowel syndrome    hx of  . Osteoporosis, senile   . Peptic ulcer disease    hx of  . Pneumonia    "bilaterally pneumonia, hospitalized 15 years ago"  . Seizures ([place])   . Stroke ([place])    hx of blood clots  . Urinary tract infection    hx of  :  Past Surgical History:  Procedure Laterality Date  . ARTERY BIOPSY  Left 10/01/2012   Procedure: LEFT TEMPORAL ARTERY BIOPSY WITH ULTRASOUND PROBE;  Surgeon: Jerrell Belfast, MD;  Location: [place];  Service: ENT;  Laterality: Left;  . BREAST LUMPECTOMY Right    x 2  . BREAST SURGERY Left    lumpectomy  . CARDIAC CATHETERIZATION    . CHOLECYSTECTOMY    . CHOLECYSTECTOMY, LAPAROSCOPIC    . COLONOSCOPY    . DILATION AND CURETTAGE OF UTERUS    . EYE SURGERY     bilateral cataract surgery  . HERNIA REPAIR    . NISSEN FUNDOPLICATION    . ROTATOR CUFF REPAIR Left   . surgery to uterus     "patient unsure of term, states not fibroids"  :   Current Outpatient Prescriptions:  .   acetaminophen (TYLENOL) 500 MG tablet, Take 500 mg by mouth every 6 (six) hours as needed for pain., Disp: , Rfl:  .  ALPRAZolam (XANAX) 0.5 MG tablet, Take 0.5 mg by mouth 2 (two) times daily as needed for anxiety. , Disp: , Rfl:  .  atorvastatin (LIPITOR) 40 MG tablet, , Disp: , Rfl:  .  lamoTRIgine (LAMICTAL) 100 MG tablet, 1/2 tab in am and one tab every night, Disp: 150 tablet, Rfl: 3 .  lisinopril-hydrochlorothiazide (PRINZIDE,ZESTORETIC) 20-12.5 MG per tablet, Take 1 tablet by mouth daily. , Disp: , Rfl:  .  metoprolol tartrate (LOPRESSOR) 25 MG tablet, Take 25 mg by mouth 2 (two) times daily. , Disp: , Rfl:  .  Multiple Vitamin (MULTIVITAMIN WITH MINERALS) TABS, Take 1 tablet by mouth daily., Disp: , Rfl:  .  rivastigmine (EXELON) 4.6 mg/24hr, , Disp: , Rfl:  .  sertraline (ZOLOFT) 100 MG tablet, Take 100 mg by mouth daily. , Disp: , Rfl:  .  valsartan-hydrochlorothiazide (DIOVAN-HCT) 160-12.5 MG per tablet, Take 12.5 tablets by mouth daily., Disp: , Rfl:  .  warfarin (COUMADIN) 2.5 MG tablet, , Disp: , Rfl:  .  traMADol (ULTRAM) 50 MG tablet, Take 1 tablet (50 mg total) by mouth every 6 (six) hours as needed., Disp: 30 tablet, Rfl: 0:  Allergies  Allergen Reactions  . Iohexol      Code: RASH, Desc: REMOTE H/O LIP TINGLING, NUMBNESS, AND HIVES WITH IV CM- 13 HR PRE-MED GIVEN AND PATIENT TOLERATED WELL- LY, Onset Date: [id number]   . Sulfonamide Derivatives Swelling  :  Family History  Problem Relation Age of Onset  . Heart disease Mother   . Cancer Sister     leukemia  . COPD Father   . Heart attack Father   . Breast cancer Sister   . Colon polyps Mother   . Colon polyps Sister   . Irritable bowel syndrome Sister   :  Social History   Social History  . Marital status: Married    Spouse name: Sonia Side  . Number of children: 2  . Years of education: 12   Occupational History  . Quality Controller Retired    Retired   Social History Main Topics  . Smoking status: Never  Smoker  . Smokeless tobacco: Never Used  . Alcohol use No  . Drug use: No  . Sexual activity: Not on file   Other Topics Concern  . 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 Patient lives at home with her husband Sonia Side).    Caffeine - None    Right handed.   High school education  :  Pertinent items are noted  in HPI.  Exam: Blood pressure 138/66, pulse (!) 57, temperature 98.2 F (36.8 C), temperature source Oral, resp. rate 16, height _0  (1.549 m), weight 157 lb 8 oz (71.4 kg), SpO2 96 %.  ECOG 1 General appearance: alert and cooperative appeared without distress. Head: Normocephalic, without obvious abnormality Throat: lips, mucosa, and tongue normal; teeth and gums normal. No oral thrush. Neck: no adenopathy Back: negative Resp: clear to auscultation bilaterally Chest wall: no tenderness Cardio: regular rate and rhythm, S1, S2 normal, no murmur, click, rub or gallop GI: soft, non-tender; bowel sounds normal; no masses,  no organomegaly Extremities: extremities normal, atraumatic, no cyanosis or edema Pulses: 2+ and symmetric Skin: Skin color, texture, turgor normal. No rashes or lesions  CBC    Component Value Date/Time   WBC 7.0 03/10/2016 1105   RBC 3.46 (L) 03/10/2016 1105   HGB 10.7 (L) 03/10/2016 1105   HGB 10.9 (L) 04/26/2010 1119   HCT 32.8 (L) 03/10/2016 1105   HCT 32.1 (L) 04/26/2010 1119   PLT 305 03/10/2016 1105   PLT 314 04/26/2010 1119   MCV 94.8 03/10/2016 1105   MCV 94.4 04/26/2010 1119   MCH 30.9 03/10/2016 1105   MCHC 32.6 03/10/2016 1105   RDW 14.6 03/10/2016 1105   RDW 13.8 04/24/2013 1417   RDW 13.3 04/26/2010 1119   LYMPHSABS 1.7 08/03/2010 1125   LYMPHSABS 1.8 04/26/2010 1119   MONOABS 0.7 08/03/2010 1125   MONOABS 0.3 04/26/2010 1119   EOSABS 0.2 08/03/2010 1125   EOSABS 0.1 04/26/2010 1119   BASOSABS 0.0 08/03/2010 1125   BASOSABS 0.0 04/26/2010 1119      Chemistry       Component Value Date/Time   NA 142 12/12/2015 1443   NA 142 04/24/2013 1417   K 3.9 12/12/2015 1443   CL 107 12/12/2015 1443   CO2 24 12/12/2015 1443   BUN 21 (H) 12/12/2015 1443   BUN 21 04/24/2013 1417   CREATININE 1.29 (H) 12/12/2015 1443      Component Value Date/Time   CALCIUM 8.7 (L) 12/12/2015 1443   ALKPHOS 71 04/24/2013 1417   AST 31 04/24/2013 1417   ALT 43 (H) 04/24/2013 1417   BILITOT 0.2 04/24/2013 1417       Ct Abdomen Pelvis Wo Contrast  Result Date: 03/21/2016 CLINICAL DATA:  Metastatic leiomyosarcoma. EXAM: CT ABDOMEN AND PELVIS WITHOUT CONTRAST TECHNIQUE: Multidetector CT imaging of the abdomen and pelvis was performed following the standard protocol without IV contrast. COMPARISON:  Chest CT 02/29/2016.  Abdomen and pelvis CT05/11/2008 FINDINGS: Lower chest: Pulmonary nodules again identified in the lung bases, better characterized on recent chest CT. Hepatobiliary: Multiple lesions identified in both hepatic lobes although incompletely characterize given the lack of intravenous contrast. Dominant lesion lateral segment left liver measures 3.2 cm. Dominant right hepatic lesion measures 2.3 cm. Gallbladder surgically absent. No intrahepatic or extrahepatic biliary dilation. Pancreas: No focal mass lesion. No dilatation of the main duct. No intraparenchymal cyst. No peripancreatic edema. Spleen: Calcified granulomata. Adrenals/Urinary Tract: No adrenal nodule or mass. Probable central sinus cysts in both kidneys. Other small cortical lesions incompletely characterized. No hydroureter. The urinary bladder is unremarkable. Stomach/Bowel: Small to moderate hiatal hernia. Duodenum is normally positioned as is the ligament of Treitz. Duodenal diverticulum noted. No small bowel wall thickening. No small bowel dilatation. The terminal ileum is normal. The appendix is normal. Diverticular changes are noted in the left colon without evidence of diverticulitis. Vascular/Lymphatic:  There is abdominal aortic atherosclerosis without  aneurysm. Prominent venous collateralization noted in the lower anterior abdominal wall. There is no gastrohepatic or hepatoduodenal ligament lymphadenopathy. No intraperitoneal or retroperitoneal lymphadenopathy. No pelvic sidewall lymphadenopathy. Reproductive: The uterus has normal CT imaging appearance. There is no adnexal mass. Other: No intraperitoneal free fluid. Musculoskeletal: Unremarkable.  Insert normal bone windows IMPRESSION: 1. No primary mass lesion identified. Specifically, no evidence for gastric mass, small bowel abnormality, or uterine mass. No retroperitoneal mass is evident. 2. Multiple hepatic lesions compatible with metastatic disease. 3. Multiple pulmonary nodules consistent with metastatic disease. Electronically Signed   By: Misty Stanley M.D.   On: 03/21/2016 13:56     Assessment and Plan:    80 year old woman with the following issues:  1. 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 She has biopsy-proven disease in the liver on 03/10/2016. She has no clear-cut primary identified without any evidence of GYN symptoms and she does have her uterus.  The imaging studies were personally reviewed and discussed with the patient and her family. The pathology was personally reviewed with the reviewing pathologist. The natural course of this disease was discussed with the patient and her family. This represents a rather aggressive presentation of this disease and we are dealing with incurable malignancy. There is no surgical option at this time and there is no curative option going forward. The only option we have at this point would be palliative chemotherapy to slow the disease progression and possibly palliate her symptoms.  Different chemotherapy regimen have been approved for this disease but she is limited with her age, performance status and comorbidities. She would be a reasonable candidate for Doxil  palliative therapy. Complications associated with this medication were reviewed today which include nausea, vomiting, myelosuppression, hand-foot syndrome as well as heart dysfunction. The benefit would be palliation of her disease and possibly slow the progression of the cancer.  Overall I feel her prognosis is poor given her limited performance status as well as aggressive disease.  She will think about pursuing chemotherapy with her family and she will let me know in the near future. She is agreeable to proceed, we will obtain an echocardiogram for baseline purposes and she will attend chemotherapy education class. Potential Port-A-Cath insertion could also be contemplated.  2. Pain: Related to her malignancy and mostly in the right upper quadrant. Prescription for Ultram was given to the patient given the fact the Tylenol has not been effective.  3. Prognosis: This was discussed in detail and I believe she has limited life expectancy. We are likely dealing with double digit months at most with aggressive therapy probably less than that without therapy. She decides not to proceed with chemotherapy, hospice enrollment would be also an option.  4. Follow-up: Will be in the next few weeks to follow on her progress.

## 2016-05-09 ENCOUNTER — Ambulatory Visit: Payer: Medicare HMO | Admitting: Podiatry

## 2016-05-12 ENCOUNTER — Emergency Department (HOSPITAL_COMMUNITY): Payer: Medicare HMO

## 2016-05-12 ENCOUNTER — Emergency Department (HOSPITAL_COMMUNITY)
Admission: EM | Admit: 2016-05-12 | Discharge: 2016-05-13 | Disposition: A | Discharge status: Home / Self Care | Payer: Medicare HMO | Attending: Emergency Medicine | Admitting: Emergency Medicine

## 2016-05-12 ENCOUNTER — Encounter (HOSPITAL_COMMUNITY): Payer: Self-pay | Admitting: Emergency Medicine

## 2016-05-12 DIAGNOSIS — W182XXA Fall in (into) shower or empty bathtub, initial encounter: Secondary | ICD-10-CM | POA: Diagnosis not present

## 2016-05-12 DIAGNOSIS — S46912A Strain of unspecified muscle, fascia and tendon at shoulder and upper arm level, left arm, initial encounter: Secondary | ICD-10-CM | POA: Insufficient documentation

## 2016-05-12 DIAGNOSIS — I10 Essential (primary) hypertension: Secondary | ICD-10-CM | POA: Diagnosis not present

## 2016-05-12 DIAGNOSIS — Y929 Unspecified place or not applicable: Secondary | ICD-10-CM | POA: Insufficient documentation

## 2016-05-12 DIAGNOSIS — Y999 Unspecified external cause status: Secondary | ICD-10-CM | POA: Insufficient documentation

## 2016-05-12 DIAGNOSIS — G309 Alzheimer's disease, unspecified: Secondary | ICD-10-CM | POA: Insufficient documentation

## 2016-05-12 DIAGNOSIS — S0003XA Contusion of scalp, initial encounter: Secondary | ICD-10-CM | POA: Insufficient documentation

## 2016-05-12 DIAGNOSIS — I2510 Atherosclerotic heart disease of native coronary artery without angina pectoris: Secondary | ICD-10-CM | POA: Insufficient documentation

## 2016-05-12 DIAGNOSIS — Z853 Personal history of malignant neoplasm of breast: Secondary | ICD-10-CM | POA: Diagnosis not present

## 2016-05-12 DIAGNOSIS — Y939 Activity, unspecified: Secondary | ICD-10-CM | POA: Diagnosis not present

## 2016-05-12 DIAGNOSIS — Z8673 Personal history of transient ischemic attack (TIA), and cerebral infarction without residual deficits: Secondary | ICD-10-CM | POA: Insufficient documentation

## 2016-05-12 DIAGNOSIS — Z79899 Other long term (current) drug therapy: Secondary | ICD-10-CM | POA: Insufficient documentation

## 2016-05-12 DIAGNOSIS — S4992XA Unspecified injury of left shoulder and upper arm, initial encounter: Secondary | ICD-10-CM | POA: Diagnosis present

## 2016-05-12 DIAGNOSIS — Z7901 Long term (current) use of anticoagulants: Secondary | ICD-10-CM | POA: Insufficient documentation

## 2016-05-12 DIAGNOSIS — W19XXXA Unspecified fall, initial encounter: Secondary | ICD-10-CM

## 2016-05-12 LAB — CBC
HEMATOCRIT: 30.1 % — AB (ref 36.0–46.0)
Hemoglobin: 9.9 g/dL — ABNORMAL LOW (ref 12.0–15.0)
MCH: 29.8 pg (ref 26.0–34.0)
MCHC: 32.9 g/dL (ref 30.0–36.0)
MCV: 90.7 fL (ref 78.0–100.0)
Platelets: 308 10*3/uL (ref 150–400)
RBC: 3.32 MIL/uL — ABNORMAL LOW (ref 3.87–5.11)
RDW: 14.1 % (ref 11.5–15.5)
WBC: 7.4 10*3/uL (ref 4.0–10.5)

## 2016-05-12 LAB — BASIC METABOLIC PANEL
Anion gap: 7 (ref 5–15)
BUN: 32 mg/dL — AB (ref 6–20)
CHLORIDE: 106 mmol/L (ref 101–111)
CO2: 26 mmol/L (ref 22–32)
Calcium: 9 mg/dL (ref 8.9–10.3)
Creatinine, Ser: 1.49 mg/dL — ABNORMAL HIGH (ref 0.44–1.00)
GFR calc Af Amer: 37 mL/min — ABNORMAL LOW (ref >60)
GFR calc non Af Amer: 32 mL/min — ABNORMAL LOW (ref >60)
GLUCOSE: 108 mg/dL — AB (ref 65–99)
POTASSIUM: 3.7 mmol/L (ref 3.5–5.1)
Sodium: 139 mmol/L (ref 135–145)

## 2016-05-12 LAB — PROTIME-INR
INR: 1.74
Prothrombin Time: 20.5 seconds — ABNORMAL HIGH (ref 11.4–15.2)

## 2016-05-12 NOTE — ED Triage Notes (Signed)
Pt from home following a fall into her bathtub. Pt states she was getting up from the toilet and was getting ready to change her panties. Pt states she was standing and had her back towards her tub and then she fell hitting her back on the edge of the top and the back of her head on the inside of her tub. Pt has slight area of swelling on the back of her head with no crepitus with palpation. Pt states she thinks she experienced a LOC. Pt states this all happened around 2045. Pt states she does take warfarin for a history of blood clots and has a known blood clot in the back of her neck.

## 2016-05-12 NOTE — ED Notes (Signed)
Bed: WA07 Expected date:  Expected time:  Means of arrival:  Comments: TR 1 

## 2016-05-12 NOTE — ED Provider Notes (Signed)
[facility] DEPT Provider Note   CSN: [id number] Arrival date & time: 05/12/16  2131  By signing my name below, I, Dora Sims, attest that this documentation has been prepared under the direction and in the presence [employer], PA-C. Electronically Signed: Dora Sims, Scribe. 05/12/2016. 10:11 PM.   History   Chief Complaint Chief Complaint  Patient presents with  . Head Injury  . Altered Mental Status    The history is provided by the patient. No language interpreter was used.     HPI Comments: [name] is a 81 y.o. female who presents to the Emergency Department complaining of pain to the back of her head s/p falling around 9 PM this evening. Pt reports she was standing and fell backwards into a bathtub. She notes she did not trip on anything. She states she struck the back of her head on the floor of the tub and believes she lost consciousness for a few minutes. She also endorses pain in the left side of her neck, left trapezius, and left shoulder. Pt notes paresthesias/tingling in her left arm. Pt reports nausea and a headache since the fall. Relative reports patient has balance issues at baseline. She notes she had a sharp pain in her chest before falling but does not believe this is related to her fall. She is on Coumadin for h/o blood clots. Pt denies SOB, vision changes, or any other associated symptoms.   Past Medical History:  Diagnosis Date  . Alzheimer disease   . Anemia    hx of  . Anxiety   . Arthritis   . Colon polyps 04/05/2010   Tubular adenomatous polyps  . Complication of anesthesia    "strong pain meds etc, causes dizziness"  . Coronary artery disease    sees Dr. Wynonia Lawman  . Depression   . Diverticulosis   . Dizziness and giddiness   . Elevated cholesterol   . Epilepsy [facility])    sees Dr. Erling Cruz  . Esophageal stricture   . GERD (gastroesophageal reflux disease) 04/05/2010  . H/O hiatal hernia    "had surgery past hx"  . Headache(784.0)      "occas"  . History of blood clots    sees Dr. Wynonia Lawman  . Hypertension    sees Dr. Marton Redwood  . Irritable bowel syndrome    hx of  . Osteoporosis, senile   . Peptic ulcer disease    hx of  . Pneumonia    "bilaterally pneumonia, hospitalized 15 years ago"  . Seizures ([place])   . Stroke ([place])    hx of blood clots  . Urinary tract infection    hx of    Patient Active Problem List   Diagnosis Date Noted  . Abnormality of gait 04/28/2013  . Neck pain 04/28/2013  . Temporal arteritis ([place]) 10/01/2012    Class: Chronic  . Depression 09/14/2011    Class: Chronic  . Deep vein thrombosis ([place]) 09/14/2011    Class: Diagnosis of  . Pulmonary embolism ([place]) 09/14/2011    Class: Diagnosis of  . Stroke ([place]) 09/14/2011    Class: Diagnosis of  . Seizures ([place]) 09/14/2011    Class: Chronic  . Breast cancer ([place]) 09/14/2011    Class: History of  . Memory loss 09/14/2011    Class: Chronic  . Head trauma 09/14/2011    Class: Acute  . ANEMIA OF CHRONIC DISEASE 02/24/2010  . ANEMIA-UNSPECIFIED 02/24/2010  . DYSPHAGIA 02/24/2010  . FECAL OCCULT BLOOD 02/24/2010  .  PERSONAL HX COLONIC POLYPS 02/24/2010  . ADENOCARCINOMA, BREAST 10/03/2007  . ADENOMATOUS COLONIC POLYP 10/03/2007  . HYPERLIPIDEMIA 10/03/2007  . ANXIETY 10/03/2007  . HYPERTENSION 10/03/2007  . ESOPHAGEAL STRICTURE 10/03/2007  . GERD 10/03/2007  . PEPTIC STRICTURE 10/03/2007  . HIATAL HERNIA 10/03/2007  . DIVERTICULOSIS, COLON 10/03/2007  . OSTEOARTHRITIS 10/03/2007    Past Surgical History:  Procedure Laterality Date  . ARTERY BIOPSY Left 10/01/2012   Procedure: LEFT TEMPORAL ARTERY BIOPSY WITH ULTRASOUND PROBE;  Surgeon: Jerrell Belfast, MD;  Location: [facility];  Service: ENT;  Laterality: Left;  . BREAST LUMPECTOMY Right    x 2  . BREAST SURGERY Left    lumpectomy  . CARDIAC CATHETERIZATION    . CHOLECYSTECTOMY    . CHOLECYSTECTOMY, LAPAROSCOPIC    . COLONOSCOPY    . DILATION AND CURETTAGE OF UTERUS    .  EYE SURGERY     bilateral cataract surgery  . HERNIA REPAIR    . NISSEN FUNDOPLICATION    . ROTATOR CUFF REPAIR Left   . surgery to uterus     "patient unsure of term, states not fibroids"    OB History    No data available       Home Medications    Prior to Admission medications   Medication Sig Start Date End Date Taking? Authorizing Provider  acetaminophen (TYLENOL) 500 MG tablet Take 500 mg by mouth every 6 (six) hours as needed for pain.    Historical Provider, MD  ALPRAZolam Duanne Moron) 0.5 MG tablet Take 0.5 mg by mouth 2 (two) times daily as needed for anxiety.  03/04/12   Historical Provider, MD  atorvastatin (LIPITOR) 40 MG tablet  06/09/15   Historical Provider, MD  lamoTRIgine (LAMICTAL) 100 MG tablet 1/2 tab in am and one tab every night 04/02/13   Marcial Pacas, MD  lisinopril-hydrochlorothiazide (PRINZIDE,ZESTORETIC) 20-12.5 MG per tablet Take 1 tablet by mouth daily.  05/06/12   Historical Provider, MD  metoprolol tartrate (LOPRESSOR) 25 MG tablet Take 25 mg by mouth 2 (two) times daily.  04/23/12   Historical Provider, MD  Multiple Vitamin (MULTIVITAMIN WITH MINERALS) TABS Take 1 tablet by mouth daily.    Historical Provider, MD  rivastigmine (EXELON) 4.6 mg/24hr  07/22/15   Historical Provider, MD  sertraline (ZOLOFT) 100 MG tablet Take 100 mg by mouth daily.  04/23/12   Historical Provider, MD  traMADol (ULTRAM) 50 MG tablet Take 1 tablet (50 mg total) by mouth every 6 (six) hours as needed. 04/06/16   Wyatt Portela, MD  valsartan-hydrochlorothiazide (DIOVAN-HCT) 160-12.5 MG per tablet Take 12.5 tablets by mouth daily. 09/23/13   Historical Provider, MD  warfarin (COUMADIN) 2.5 MG tablet  08/31/15   Historical Provider, MD    Family History Family History  Problem Relation Age of Onset  . COPD Father   . Heart attack Father   . Heart disease Mother   . Colon polyps Mother   . Cancer Sister     leukemia  . Breast cancer Sister   . Colon polyps Sister   . Irritable bowel  syndrome Sister     Social History Social History  Substance Use Topics  . Smoking status: Never Smoker  . Smokeless tobacco: Never Used  . Alcohol use No     Allergies   Iohexol; Morphine and related; and Sulfonamide derivatives   Review of Systems Review of Systems A complete 10 system review of systems was obtained and all systems are negative except as noted  in the HPI and PMH.    Physical Exam Updated Vital Signs BP 140/60 (BP Location: Right Arm)   Pulse (!) 55   Temp 98.3 F (36.8 C) (Oral)   Resp 16   SpO2 94%   Physical Exam  Constitutional: She is oriented to person, place, and time. She appears well-developed and well-nourished. No distress.  Nontoxic and in NAD  HENT:  Head: Normocephalic and atraumatic.  Contusion to posterior parietal scalp on the left. No battle's sign or raccoon's eyes. No skull instability.  Eyes: Conjunctivae and EOM are normal. Pupils are equal, round, and reactive to light. No scleral icterus.  Neck: Normal range of motion.  TTP to the left cervical paraspinal muscles. No bony deformities, step offs, or crepitus to the cervical midline.  Cardiovascular: Normal rate, regular rhythm and intact distal pulses.   Distal radial pulse 2+ in the LUE  Pulmonary/Chest: Effort normal. No respiratory distress. She has no wheezes.  Respirations even and unlabored  Musculoskeletal:       Left shoulder: She exhibits decreased range of motion (appreciated to be secondary to pain), tenderness, bony tenderness and pain. She exhibits no swelling, no effusion, no deformity and normal pulse.  Neurological: She is alert and oriented to person, place, and time.  Skin: Skin is warm and dry. No rash noted. She is not diaphoretic. No erythema. No pallor.  Psychiatric: She has a normal mood and affect. Her behavior is normal.  Nursing note and vitals reviewed.    ED Treatments / Results  Labs (all labs ordered are listed, but only abnormal results are  displayed) Labs Reviewed  BASIC METABOLIC PANEL - Abnormal; Notable for the following:       Result Value   Glucose, Bld 108 (*)    BUN 32 (*)    Creatinine, Ser 1.49 (*)    GFR calc non Af Amer 32 (*)    GFR calc Af Amer 37 (*)    All other components within normal limits  CBC - Abnormal; Notable for the following:    RBC 3.32 (*)    Hemoglobin 9.9 (*)    HCT 30.1 (*)    All other components within normal limits  PROTIME-INR - Abnormal; Notable for the following:    Prothrombin Time 20.5 (*)    All other components within normal limits    EKG  EKG Interpretation  Date/Time:  Friday May 12 2016 22:31:41 EDT Ventricular Rate:  56 PR Interval:    QRS Duration: 103 QT Interval:  465 QTC Calculation: 449 R Axis:   49 Text Interpretation:  Sinus rhythm Short PR interval Abnormal R-wave progression, early transition No significant change since last tracing Confirmed by FLOYD MD, DANIEL [phone]) on 05/13/2016 6:33:21 PM       Radiology Ct Head Wo Contrast  Addendum Date: 05/13/2016   ADDENDUM REPORT: 05/12/2016 23:57 ADDENDUM: Incompletely visualized 6 mm pulmonary nodule in the right upper lobe. Non-contrast chest CT at 6-12 months is recommended. If the nodule is stable at time of repeat CT, then future CT at 18-24 months (from today's scan) is considered optional for low-risk patients, but is recommended for high-risk patients. This recommendation follows the consensus statement: Guidelines for Management of Incidental Pulmonary Nodules Detected on CT Images: From the Fleischner Society 2017; Radiology 2017; 284:228-243. These results will be called to the ordering clinician or representative by the Radiologist Assistant, and communication documented in the PACS or zVision Dashboard. Electronically Signed   By: Lennette Bihari  [place] M.D.   On: 05/12/2016 23:57   Result Date: 05/12/2016 CLINICAL DATA:  Head pain status post falling. Left-sided neck pain. EXAM: CT HEAD WITHOUT CONTRAST CT  CERVICAL SPINE WITHOUT CONTRAST TECHNIQUE: Multidetector CT imaging of the head and cervical spine was performed following the standard protocol without intravenous contrast. Multiplanar CT image reconstructions of the cervical spine were also generated. COMPARISON:  12/12/2015 head CT FINDINGS: CT HEAD FINDINGS Brain: No mass lesion, intraparenchymal hemorrhage or extra-axial collection. No evidence of acute cortical infarct. There is periventricular hypoattenuation compatible with chronic microvascular disease. Vascular: No hyperdense vessel or unexpected calcification. Skull: Normal visualized skull base, calvarium and extracranial soft tissues. Sinuses/Orbits: No sinus fluid levels or advanced mucosal thickening. No mastoid effusion. Normal orbits. CT CERVICAL SPINE FINDINGS Alignment: No static subluxation. Facets are aligned. Occipital condyles are normally positioned. Skull base and vertebrae: No acute fracture. Soft tissues and spinal canal: No prevertebral fluid or swelling. No visible canal hematoma. Disc levels: Moderate bilateral neural foraminal stenosis at C5-C6. No bony spinal canal stenosis. Severe right facet hypertrophy at C3-4 and C4-5. Upper chest: There is an incompletely visualized 6 mm nodule in the right upper lobe (series 6, image 83). There is a 3 mm nodule in the left upper lobe. There is atherosclerotic calcification of the thoracic aorta. Other: Normal visualized paraspinal cervical soft tissues. IMPRESSION: 1. No acute intracranial abnormality. 2. Chronic microvascular ischemia. 3. No acute fracture or static subluxation of the cervical spine. 4. Suspected moderate bilateral foraminal stenosis at C5-C6 primarily due to uncovertebral hypertrophy. Electronically Signed: By: Ulyses Jarred M.D. On: 05/12/2016 23:26   Ct Cervical Spine Wo Contrast  Addendum Date: 05/13/2016   ADDENDUM REPORT: 05/12/2016 23:57 ADDENDUM: Incompletely visualized 6 mm pulmonary nodule in the right upper  lobe. Non-contrast chest CT at 6-12 months is recommended. If the nodule is stable at time of repeat CT, then future CT at 18-24 months (from today's scan) is considered optional for low-risk patients, but is recommended for high-risk patients. This recommendation follows the consensus statement: Guidelines for Management of Incidental Pulmonary Nodules Detected on CT Images: From the Fleischner Society 2017; Radiology 2017; 284:228-243. These results will be called to the ordering clinician or representative by the Radiologist Assistant, and communication documented in the PACS or zVision Dashboard. Electronically Signed   By: Ulyses Jarred M.D.   On: 05/12/2016 23:57   Result Date: 05/12/2016 CLINICAL DATA:  Head pain status post falling. Left-sided neck pain. EXAM: CT HEAD WITHOUT CONTRAST CT CERVICAL SPINE WITHOUT CONTRAST TECHNIQUE: Multidetector CT imaging of the head and cervical spine was performed following the standard protocol without intravenous contrast. Multiplanar CT image reconstructions of the cervical spine were also generated. COMPARISON:  12/12/2015 head CT FINDINGS: CT HEAD FINDINGS Brain: No mass lesion, intraparenchymal hemorrhage or extra-axial collection. No evidence of acute cortical infarct. There is periventricular hypoattenuation compatible with chronic microvascular disease. Vascular: No hyperdense vessel or unexpected calcification. Skull: Normal visualized skull base, calvarium and extracranial soft tissues. Sinuses/Orbits: No sinus fluid levels or advanced mucosal thickening. No mastoid effusion. Normal orbits. CT CERVICAL SPINE FINDINGS Alignment: No static subluxation. Facets are aligned. Occipital condyles are normally positioned. Skull base and vertebrae: No acute fracture. Soft tissues and spinal canal: No prevertebral fluid or swelling. No visible canal hematoma. Disc levels: Moderate bilateral neural foraminal stenosis at C5-C6. No bony spinal canal stenosis. Severe right  facet hypertrophy at C3-4 and C4-5. Upper chest: There is an incompletely visualized 6 mm nodule in the  right upper lobe (series 6, image 83). There is a 3 mm nodule in the left upper lobe. There is atherosclerotic calcification of the thoracic aorta. Other: Normal visualized paraspinal cervical soft tissues. IMPRESSION: 1. No acute intracranial abnormality. 2. Chronic microvascular ischemia. 3. No acute fracture or static subluxation of the cervical spine. 4. Suspected moderate bilateral foraminal stenosis at C5-C6 primarily due to uncovertebral hypertrophy. Electronically Signed: By: Ulyses Jarred M.D. On: 05/12/2016 23:26   Dg Shoulder Left  Result Date: 05/12/2016 CLINICAL DATA:  [place] in the bathtub tonight.  Left shoulder pain. EXAM: LEFT SHOULDER - 2+ VIEW COMPARISON:  12/12/2015 FINDINGS: Degenerative changes in the left shoulder involving the glenohumeral and acromioclavicular joints. Loss of subacromial space likely indicates chronic rotator cuff arthropathy. No evidence of acute fracture or dislocation. Surgical clips in the left axilla. IMPRESSION: Degenerative changes in the left shoulder with probable chronic rotator cuff arthropathy. No acute fracture or dislocation. Electronically Signed   By: Lucienne Capers M.D.   On: 05/12/2016 23:10    Procedures Procedures (including critical care time)  DIAGNOSTIC STUDIES: Oxygen Saturation is 94% on RA, adequate by my interpretation.    COORDINATION OF CARE: 10:18 PM Discussed treatment plan with pt at bedside and pt agreed to plan.  Medications Ordered in ED Medications - No data to display   Initial Impression / Assessment and Plan / ED Course  I have reviewed the triage vital signs and the nursing notes.  Pertinent labs & imaging results that were available during my care of the patient were reviewed by me and considered in my medical decision making (see chart for details).  Clinical Course    Patient presents to the ED after a  fall. Fall secondary to "balance issues" which are chronic for the patient. Patient c/o headache and left shoulder pain. Physical exam reassuring. No focal deficits. Imaging of head, C-spine, and L shoulder are all negative for acute traumatic injury. Labs fairly consistent with baseline. Patient told of her subtherapeutic INR today. She is to follow up with her doctor to have this rechecked and reports not taking her nightly dose tonight. Patient stable for discharge with instructions for supportive care. Return precautions discussed and provided. Patient and family with no unaddressed concerns.   Final Clinical Impressions(s) / ED Diagnoses   Final diagnoses:  Fall, initial encounter  Contusion of scalp, initial encounter  Shoulder strain, left, initial encounter    New Prescriptions Discharge Medication List as of 05/13/2016 12:39 AM      I personally performed the services described in this documentation, which was scribed in my presence. The recorded information has been reviewed and is accurate.      Antonietta Breach, PA-C 05/16/16 [place], PA-C 05/16/16 [place], MD 05/18/16 [phone]

## 2016-05-12 NOTE — ED Notes (Signed)
Patient transported to X-ray 

## 2016-05-13 MED ORDER — ACETAMINOPHEN 325 MG PO TABS
650.0000 mg | ORAL_TABLET | Freq: Once | ORAL | Status: AC
  Administered 2016-05-13: 650 mg via ORAL
  Filled 2016-05-13: qty 2

## 2016-05-13 NOTE — ED Notes (Signed)
Patient d/c'd self care with family.  F/U and pain management reviewed.  Patient verbalized understanding.

## 2016-05-13 NOTE — Discharge Instructions (Signed)
Take Tylenol as needed for headache or shoulder pain. You may ice your shoulder to limit swelling. Be sure to stretch your shoulder a few times per day to prevent frozen shoulder. Follow-up with your primary care doctor regarding your visit to the emergency department today. You may return for new or concerning symptoms.

## 2016-05-19 ENCOUNTER — Ambulatory Visit: Payer: Medicare HMO | Admitting: Oncology

## 2016-09-10 ENCOUNTER — Emergency Department (HOSPITAL_COMMUNITY)

## 2016-09-10 ENCOUNTER — Inpatient Hospital Stay (HOSPITAL_COMMUNITY)
Admission: EM | Admit: 2016-09-10 | Discharge: 2016-09-14 | DRG: 563 | Disposition: A | Discharge status: Hospice | Attending: Family Medicine | Admitting: Family Medicine

## 2016-09-10 ENCOUNTER — Encounter (HOSPITAL_COMMUNITY): Payer: Self-pay | Admitting: *Deleted

## 2016-09-10 DIAGNOSIS — M2540 Effusion, unspecified joint: Secondary | ICD-10-CM | POA: Diagnosis present

## 2016-09-10 DIAGNOSIS — Z86718 Personal history of other venous thrombosis and embolism: Secondary | ICD-10-CM

## 2016-09-10 DIAGNOSIS — I2699 Other pulmonary embolism without acute cor pulmonale: Secondary | ICD-10-CM | POA: Diagnosis present

## 2016-09-10 DIAGNOSIS — G309 Alzheimer's disease, unspecified: Secondary | ICD-10-CM | POA: Diagnosis present

## 2016-09-10 DIAGNOSIS — Z9842 Cataract extraction status, left eye: Secondary | ICD-10-CM

## 2016-09-10 DIAGNOSIS — Z9841 Cataract extraction status, right eye: Secondary | ICD-10-CM

## 2016-09-10 DIAGNOSIS — Z79899 Other long term (current) drug therapy: Secondary | ICD-10-CM

## 2016-09-10 DIAGNOSIS — G40909 Epilepsy, unspecified, not intractable, without status epilepticus: Secondary | ICD-10-CM | POA: Diagnosis present

## 2016-09-10 DIAGNOSIS — Z882 Allergy status to sulfonamides status: Secondary | ICD-10-CM

## 2016-09-10 DIAGNOSIS — S93402A Sprain of unspecified ligament of left ankle, initial encounter: Principal | ICD-10-CM

## 2016-09-10 DIAGNOSIS — F329 Major depressive disorder, single episode, unspecified: Secondary | ICD-10-CM | POA: Diagnosis present

## 2016-09-10 DIAGNOSIS — Z86711 Personal history of pulmonary embolism: Secondary | ICD-10-CM

## 2016-09-10 DIAGNOSIS — Z8674 Personal history of sudden cardiac arrest: Secondary | ICD-10-CM

## 2016-09-10 DIAGNOSIS — R569 Unspecified convulsions: Secondary | ICD-10-CM

## 2016-09-10 DIAGNOSIS — Z7901 Long term (current) use of anticoagulants: Secondary | ICD-10-CM

## 2016-09-10 DIAGNOSIS — Z8701 Personal history of pneumonia (recurrent): Secondary | ICD-10-CM

## 2016-09-10 DIAGNOSIS — N179 Acute kidney failure, unspecified: Secondary | ICD-10-CM | POA: Diagnosis present

## 2016-09-10 DIAGNOSIS — E860 Dehydration: Secondary | ICD-10-CM | POA: Diagnosis not present

## 2016-09-10 DIAGNOSIS — Z515 Encounter for palliative care: Secondary | ICD-10-CM | POA: Diagnosis present

## 2016-09-10 DIAGNOSIS — R109 Unspecified abdominal pain: Secondary | ICD-10-CM | POA: Diagnosis present

## 2016-09-10 DIAGNOSIS — Z853 Personal history of malignant neoplasm of breast: Secondary | ICD-10-CM

## 2016-09-10 DIAGNOSIS — Z66 Do not resuscitate: Secondary | ICD-10-CM | POA: Diagnosis present

## 2016-09-10 DIAGNOSIS — I2510 Atherosclerotic heart disease of native coronary artery without angina pectoris: Secondary | ICD-10-CM | POA: Diagnosis present

## 2016-09-10 DIAGNOSIS — N183 Chronic kidney disease, stage 3 (moderate): Secondary | ICD-10-CM | POA: Diagnosis present

## 2016-09-10 DIAGNOSIS — F0280 Dementia in other diseases classified elsewhere without behavioral disturbance: Secondary | ICD-10-CM | POA: Diagnosis present

## 2016-09-10 DIAGNOSIS — K5900 Constipation, unspecified: Secondary | ICD-10-CM

## 2016-09-10 DIAGNOSIS — Z885 Allergy status to narcotic agent status: Secondary | ICD-10-CM

## 2016-09-10 DIAGNOSIS — R531 Weakness: Secondary | ICD-10-CM

## 2016-09-10 DIAGNOSIS — K219 Gastro-esophageal reflux disease without esophagitis: Secondary | ICD-10-CM | POA: Diagnosis present

## 2016-09-10 DIAGNOSIS — Z803 Family history of malignant neoplasm of breast: Secondary | ICD-10-CM

## 2016-09-10 DIAGNOSIS — D638 Anemia in other chronic diseases classified elsewhere: Secondary | ICD-10-CM | POA: Diagnosis present

## 2016-09-10 DIAGNOSIS — W19XXXA Unspecified fall, initial encounter: Secondary | ICD-10-CM | POA: Diagnosis present

## 2016-09-10 DIAGNOSIS — Z91041 Radiographic dye allergy status: Secondary | ICD-10-CM

## 2016-09-10 DIAGNOSIS — R296 Repeated falls: Secondary | ICD-10-CM

## 2016-09-10 DIAGNOSIS — C7802 Secondary malignant neoplasm of left lung: Secondary | ICD-10-CM | POA: Diagnosis present

## 2016-09-10 DIAGNOSIS — C228 Malignant neoplasm of liver, primary, unspecified as to type: Secondary | ICD-10-CM | POA: Diagnosis present

## 2016-09-10 DIAGNOSIS — I129 Hypertensive chronic kidney disease with stage 1 through stage 4 chronic kidney disease, or unspecified chronic kidney disease: Secondary | ICD-10-CM | POA: Diagnosis present

## 2016-09-10 DIAGNOSIS — F419 Anxiety disorder, unspecified: Secondary | ICD-10-CM | POA: Diagnosis present

## 2016-09-10 DIAGNOSIS — M810 Age-related osteoporosis without current pathological fracture: Secondary | ICD-10-CM | POA: Diagnosis present

## 2016-09-10 DIAGNOSIS — D649 Anemia, unspecified: Secondary | ICD-10-CM

## 2016-09-10 DIAGNOSIS — Z8711 Personal history of peptic ulcer disease: Secondary | ICD-10-CM

## 2016-09-10 DIAGNOSIS — M79671 Pain in right foot: Secondary | ICD-10-CM | POA: Diagnosis present

## 2016-09-10 DIAGNOSIS — C7801 Secondary malignant neoplasm of right lung: Secondary | ICD-10-CM | POA: Diagnosis present

## 2016-09-10 HISTORY — DX: Malignant (primary) neoplasm, unspecified: C80.1

## 2016-09-10 LAB — URINALYSIS, ROUTINE W REFLEX MICROSCOPIC
Bilirubin Urine: NEGATIVE
Glucose, UA: NEGATIVE mg/dL
HGB URINE DIPSTICK: NEGATIVE
Ketones, ur: NEGATIVE mg/dL
LEUKOCYTES UA: NEGATIVE
Nitrite: NEGATIVE
PROTEIN: NEGATIVE mg/dL
Specific Gravity, Urine: 1.017 (ref 1.005–1.030)
pH: 5 (ref 5.0–8.0)

## 2016-09-10 LAB — CBC WITH DIFFERENTIAL/PLATELET
Basophils Absolute: 0 10*3/uL (ref 0.0–0.1)
Basophils Relative: 0 %
EOS PCT: 1 %
Eosinophils Absolute: 0.2 10*3/uL (ref 0.0–0.7)
HEMATOCRIT: 25.7 % — AB (ref 36.0–46.0)
Hemoglobin: 8.1 g/dL — ABNORMAL LOW (ref 12.0–15.0)
LYMPHS PCT: 12 %
Lymphs Abs: 1.5 10*3/uL (ref 0.7–4.0)
MCH: 27.6 pg (ref 26.0–34.0)
MCHC: 31.5 g/dL (ref 30.0–36.0)
MCV: 87.4 fL (ref 78.0–100.0)
MONO ABS: 1.3 10*3/uL — AB (ref 0.1–1.0)
MONOS PCT: 10 %
NEUTROS ABS: 10.2 10*3/uL — AB (ref 1.7–7.7)
Neutrophils Relative %: 77 %
Platelets: 510 10*3/uL — ABNORMAL HIGH (ref 150–400)
RBC: 2.94 MIL/uL — ABNORMAL LOW (ref 3.87–5.11)
RDW: 16.3 % — AB (ref 11.5–15.5)
WBC: 13.3 10*3/uL — ABNORMAL HIGH (ref 4.0–10.5)

## 2016-09-10 LAB — COMPREHENSIVE METABOLIC PANEL
ALT: 35 U/L (ref 14–54)
ANION GAP: 8 (ref 5–15)
AST: 54 U/L — ABNORMAL HIGH (ref 15–41)
Albumin: 3.4 g/dL — ABNORMAL LOW (ref 3.5–5.0)
Alkaline Phosphatase: 164 U/L — ABNORMAL HIGH (ref 38–126)
BILIRUBIN TOTAL: 0.5 mg/dL (ref 0.3–1.2)
BUN: 23 mg/dL — ABNORMAL HIGH (ref 6–20)
CO2: 25 mmol/L (ref 22–32)
Calcium: 8.7 mg/dL — ABNORMAL LOW (ref 8.9–10.3)
Chloride: 102 mmol/L (ref 101–111)
Creatinine, Ser: 1.61 mg/dL — ABNORMAL HIGH (ref 0.44–1.00)
GFR calc Af Amer: 34 mL/min — ABNORMAL LOW (ref >60)
GFR, EST NON AFRICAN AMERICAN: 29 mL/min — AB (ref >60)
Glucose, Bld: 125 mg/dL — ABNORMAL HIGH (ref 65–99)
POTASSIUM: 4.4 mmol/L (ref 3.5–5.1)
Sodium: 135 mmol/L (ref 135–145)
TOTAL PROTEIN: 7.3 g/dL (ref 6.5–8.1)

## 2016-09-10 LAB — PROTIME-INR
INR: 2.3
Prothrombin Time: 25.7 seconds — ABNORMAL HIGH (ref 11.4–15.2)

## 2016-09-10 MED ORDER — SODIUM CHLORIDE 0.9 % IV BOLUS (SEPSIS)
500.0000 mL | Freq: Once | INTRAVENOUS | Status: AC
  Administered 2016-09-10: 500 mL via INTRAVENOUS

## 2016-09-10 NOTE — ED Notes (Signed)
Call back to hospice and let them know that pt is here as EPIC prompted me to

## 2016-09-10 NOTE — ED Triage Notes (Signed)
Pt is a hospice pt due to cancer.  Pt has been getting increasingly weak and has had multiple falls.  Pt fell again early this am and injured her right ankle (pain and swelling and inability to bear any weight since which made it impossible for family to help her with toileting ect).  Pt also struck head, no LOC>  Pt is on coumadin.  Pt is alert at baseline at this time

## 2016-09-10 NOTE — ED Notes (Signed)
Bed: WA09 Expected date: 09/10/16 Expected time: 5:42 PM Means of arrival: Ambulance Comments: Ankle injury from fall, ht head on blood thinners, Hospice pt

## 2016-09-10 NOTE — ED Provider Notes (Signed)
[facility] DEPT Provider Note   CSN: [id number] Arrival date & time: 09/10/16  1748     History   Chief Complaint Chief Complaint  Patient presents with  . Ankle Pain    HPI [name] is a 82 y.o. female.  HPI Patient has history of cancer and dementia. She is a hospice patient. She presents with multiple falls in the last few days. Patient had unwitnessed fall today and injured right ankle. Think she made her head. No loss of consciousness. Denied any neck pain. She's had no fever or chills. She has had increased weakness and difficulty walking. She normally ambulates with a walker. Past Medical History:  Diagnosis Date  . Alzheimer disease   . Anemia    hx of  . Anxiety   . Arthritis   . Cancer ([place])   . Colon polyps 04/05/2010   Tubular adenomatous polyps  . Complication of anesthesia    "strong pain meds etc, causes dizziness"  . Coronary artery disease    sees Dr. Wynonia Lawman  . Depression   . Diverticulosis   . Dizziness and giddiness   . Elevated cholesterol   . Epilepsy [facility])    sees Dr. Erling Cruz  . Esophageal stricture   . GERD (gastroesophageal reflux disease) 04/05/2010  . H/O hiatal hernia    "had surgery past hx"  . Headache(784.0)    "occas"  . History of blood clots    sees Dr. Wynonia Lawman  . Hypertension    sees Dr. Marton Redwood  . Irritable bowel syndrome    hx of  . Osteoporosis, senile   . Peptic ulcer disease    hx of  . Pneumonia    "bilaterally pneumonia, hospitalized 15 years ago"  . Seizures ([place])   . Stroke ([place])    hx of blood clots  . Urinary tract infection    hx of    Patient Active Problem List   Diagnosis Date Noted  . Generalized weakness 09/10/2016  . Abnormality of gait 04/28/2013  . Neck pain 04/28/2013  . Temporal arteritis ([place]) 10/01/2012    Class: Chronic  . Depression 09/14/2011    Class: Chronic  . Deep vein thrombosis ([place]) 09/14/2011    Class: Diagnosis of  . Pulmonary embolism ([place]) 09/14/2011   Class: Diagnosis of  . Stroke ([place]) 09/14/2011    Class: Diagnosis of  . Seizures ([place]) 09/14/2011    Class: Chronic  . Breast cancer ([place]) 09/14/2011    Class: History of  . Memory loss 09/14/2011    Class: Chronic  . Head trauma 09/14/2011    Class: Acute  . ANEMIA OF CHRONIC DISEASE 02/24/2010  . ANEMIA-UNSPECIFIED 02/24/2010  . DYSPHAGIA 02/24/2010  . FECAL OCCULT BLOOD 02/24/2010  . PERSONAL HX COLONIC POLYPS 02/24/2010  . ADENOCARCINOMA, BREAST 10/03/2007  . ADENOMATOUS COLONIC POLYP 10/03/2007  . HYPERLIPIDEMIA 10/03/2007  . ANXIETY 10/03/2007  . HYPERTENSION 10/03/2007  . ESOPHAGEAL STRICTURE 10/03/2007  . GERD 10/03/2007  . PEPTIC STRICTURE 10/03/2007  . HIATAL HERNIA 10/03/2007  . DIVERTICULOSIS, COLON 10/03/2007  . OSTEOARTHRITIS 10/03/2007    Past Surgical History:  Procedure Laterality Date  . ARTERY BIOPSY Left 10/01/2012   Procedure: LEFT TEMPORAL ARTERY BIOPSY WITH ULTRASOUND PROBE;  Surgeon: Jerrell Belfast, MD;  Location: [facility];  Service: ENT;  Laterality: Left;  . BREAST LUMPECTOMY Right    x 2  . BREAST SURGERY Left    lumpectomy  . CARDIAC CATHETERIZATION    . CHOLECYSTECTOMY    .  CHOLECYSTECTOMY, LAPAROSCOPIC    . COLONOSCOPY    . DILATION AND CURETTAGE OF UTERUS    . EYE SURGERY     bilateral cataract surgery  . HERNIA REPAIR    . NISSEN FUNDOPLICATION    . ROTATOR CUFF REPAIR Left   . surgery to uterus     "patient unsure of term, states not fibroids"    OB History    No data available       Home Medications    Prior to Admission medications   Medication Sig Start Date End Date Taking? Authorizing Provider  ALPRAZolam Duanne Moron) 0.5 MG tablet Take 0.5 mg by mouth 2 (two) times daily.    Yes Historical Provider, MD  DULoxetine (CYMBALTA) 60 MG capsule Take 60 mg by mouth at bedtime.   Yes Historical Provider, MD  lamoTRIgine (LAMICTAL) 100 MG tablet Take 100 mg by mouth 2 (two) times daily.   Yes Historical Provider, MD    lisinopril-hydrochlorothiazide (PRINZIDE,ZESTORETIC) 20-12.5 MG per tablet Take 1 tablet by mouth at bedtime.    Yes Historical Provider, MD  metoprolol tartrate (LOPRESSOR) 25 MG tablet Take 25 mg by mouth 2 (two) times daily.    Yes Historical Provider, MD  rivastigmine (EXELON) 4.6 mg/24hr Place 4.6 mg onto the skin at bedtime. Pt applies to left upper back.   Yes Historical Provider, MD  warfarin (COUMADIN) 2.5 MG tablet Take 2.5 mg by mouth See admin instructions. Pt takes one tablet every morning on Sunday, Monday, Tuesday, Thursday, Friday, and Saturday.   Pt takes one tablet twice daily on Wednesday.   Yes Historical Provider, MD    Family History Family History  Problem Relation Age of Onset  . COPD Father   . Heart attack Father   . Heart disease Mother   . Colon polyps Mother   . Cancer Sister     leukemia  . Breast cancer Sister   . Colon polyps Sister   . Irritable bowel syndrome Sister     Social History Social History  Substance Use Topics  . Smoking status: Never Smoker  . Smokeless tobacco: Never Used  . Alcohol use No     Allergies   Iohexol; Morphine and related; and Sulfonamide derivatives   Review of Systems Review of Systems  Unable to perform ROS: Dementia     Physical Exam Updated Vital Signs BP 145/66   Pulse 94   Temp 98.5 F (36.9 C) (Oral)   Resp 24   Wt 150 lb (68 kg)   SpO2 92%   BMI 28.34 kg/m   Physical Exam  Constitutional: She appears well-developed and well-nourished. No distress.  HENT:  Head: Normocephalic.  Mouth/Throat: Oropharynx is clear and moist.  Patient with yellowish contusion to the left temporal region. No underlying bony deformity. Midface is stable.  Eyes: EOM are normal. Pupils are equal, round, and reactive to light.  Neck: Normal range of motion. Neck supple.  No meningismus. No posterior midline cervical tenderness to palpation.  Cardiovascular: Normal rate and regular rhythm.  Exam reveals no gallop  and no friction rub.   No murmur heard. Pulmonary/Chest: Effort normal and breath sounds normal.  Abdominal: Soft. Bowel sounds are normal. There is no tenderness. There is no rebound and no guarding.  Musculoskeletal: Normal range of motion. She exhibits edema and tenderness.  Patient has swelling and contusion to the lateral malleolus of the right ankle. There is tenderness to palpation over the lateral malleolus and just distally. No proximal  fibular tenderness. Distal pulses are 2+.  Neurological: She is alert.  Baseline confusion per family. Moves all extremities without focal deficit. Sensation fully intact.  Skin: Skin is warm and dry. Capillary refill takes less than 2 seconds. No rash noted. No erythema.  Psychiatric: She has a normal mood and affect. Her behavior is normal.  Nursing note and vitals reviewed.    ED Treatments / Results  Labs (all labs ordered are listed, but only abnormal results are displayed) Labs Reviewed  CBC WITH DIFFERENTIAL/PLATELET - Abnormal; Notable for the following:       Result Value   WBC 13.3 (*)    RBC 2.94 (*)    Hemoglobin 8.1 (*)    HCT 25.7 (*)    RDW 16.3 (*)    Platelets 510 (*)    Neutro Abs 10.2 (*)    Monocytes Absolute 1.3 (*)    All other components within normal limits  COMPREHENSIVE METABOLIC PANEL - Abnormal; Notable for the following:    Glucose, Bld 125 (*)    BUN 23 (*)    Creatinine, Ser 1.61 (*)    Calcium 8.7 (*)    Albumin 3.4 (*)    AST 54 (*)    Alkaline Phosphatase 164 (*)    GFR calc non Af Amer 29 (*)    GFR calc Af Amer 34 (*)    All other components within normal limits  PROTIME-INR - Abnormal; Notable for the following:    Prothrombin Time 25.7 (*)    All other components within normal limits  URINALYSIS, ROUTINE W REFLEX MICROSCOPIC    EKG  EKG Interpretation None       Radiology Dg Chest 2 View  Result Date: 09/10/2016 CLINICAL DATA:  Weakness.  Cancer. EXAM: CHEST  2 VIEW COMPARISON:   12/12/2015 chest radiograph. FINDINGS: Surgical clips overlie the left axilla. Stable cardiomediastinal silhouette with mild cardiomegaly and aortic atherosclerosis. No pneumothorax. No pleural effusion. There are multiple scattered pulmonary nodules throughout both lungs, largest in the mid to lower right lung, which appear increased in size since 12/12/2015 chest radiograph. No pulmonary edema. No acute consolidative airspace disease. IMPRESSION: 1. Interval growth of scattered bilateral pulmonary nodules compatible with progression of pulmonary metastases. 2. Stable mild cardiomegaly without overt pulmonary edema . 3. Aortic atherosclerosis. Electronically Signed   By: Ilona Sorrel M.D.   On: 09/10/2016 21:50   Dg Ankle Complete Right  Result Date: 09/10/2016 CLINICAL DATA:  Multiple falls this week. Right ankle pain and swelling, most prominent laterally. EXAM: RIGHT ANKLE - COMPLETE 3+ VIEW COMPARISON:  None. FINDINGS: No fracture or subluxation. No suspicious focal osseous lesion. Small Achilles right calcaneal spur. No radiopaque foreign body. IMPRESSION: No fracture or subluxation. Electronically Signed   By: Ilona Sorrel M.D.   On: 09/10/2016 18:52   Ct Head Wo Contrast  Result Date: 09/10/2016 CLINICAL DATA:  Recent fall EXAM: CT HEAD WITHOUT CONTRAST TECHNIQUE: Contiguous axial images were obtained from the base of the skull through the vertex without intravenous contrast. COMPARISON:  05/12/2016 FINDINGS: Brain: Mild atrophic changes are noted. Chronic white matter ischemic change is seen as well. These findings are stable from the prior exam. No findings to suggest acute hemorrhage, acute infarction or space-occupying mass lesion are noted. Vascular: No hyperdense vessel or unexpected calcification. Skull: Normal. Negative for fracture or focal lesion. Sinuses/Orbits: No acute finding. Other: None. IMPRESSION: Chronic atrophic and ischemic changes. Electronically Signed   By: Linus Mako.D.  On: 09/10/2016 19:52    Procedures Procedures (including critical care time)  Medications Ordered in ED Medications  sodium chloride 0.9 % bolus 500 mL (not administered)     Initial Impression / Assessment and Plan / ED Course  I have reviewed the triage vital signs and the nursing notes.  Pertinent labs & imaging results that were available during my care of the patient were reviewed by me and considered in my medical decision making (see chart for details).     No acute abdomen on CT. Patient with worsening renal function and anemia. Given her frequent falls and anticoagulation, as and will admit for.  Final Clinical Impressions(s) / ED Diagnoses   Final diagnoses:  Generalized weakness  Frequent falls  Dehydration  Anemia, unspecified type  Sprain of left ankle, unspecified ligament, initial encounter    New Prescriptions New Prescriptions   No medications on file     Julianne Rice, MD 09/10/16 2257

## 2016-09-11 ENCOUNTER — Observation Stay (HOSPITAL_COMMUNITY)

## 2016-09-11 ENCOUNTER — Encounter (HOSPITAL_COMMUNITY): Payer: Self-pay | Admitting: Internal Medicine

## 2016-09-11 DIAGNOSIS — R569 Unspecified convulsions: Secondary | ICD-10-CM

## 2016-09-11 DIAGNOSIS — R109 Unspecified abdominal pain: Secondary | ICD-10-CM | POA: Diagnosis present

## 2016-09-11 DIAGNOSIS — D638 Anemia in other chronic diseases classified elsewhere: Secondary | ICD-10-CM

## 2016-09-11 DIAGNOSIS — M79671 Pain in right foot: Secondary | ICD-10-CM

## 2016-09-11 DIAGNOSIS — I2699 Other pulmonary embolism without acute cor pulmonale: Secondary | ICD-10-CM

## 2016-09-11 DIAGNOSIS — S93402A Sprain of unspecified ligament of left ankle, initial encounter: Secondary | ICD-10-CM | POA: Insufficient documentation

## 2016-09-11 DIAGNOSIS — R531 Weakness: Secondary | ICD-10-CM

## 2016-09-11 DIAGNOSIS — R1084 Generalized abdominal pain: Secondary | ICD-10-CM

## 2016-09-11 LAB — CBC WITH DIFFERENTIAL/PLATELET
BASOS ABS: 0 10*3/uL (ref 0.0–0.1)
BASOS PCT: 0 %
Eosinophils Absolute: 0.2 10*3/uL (ref 0.0–0.7)
Eosinophils Relative: 2 %
HCT: 22.7 % — ABNORMAL LOW (ref 36.0–46.0)
Hemoglobin: 7.3 g/dL — ABNORMAL LOW (ref 12.0–15.0)
Lymphocytes Relative: 16 %
Lymphs Abs: 1.7 10*3/uL (ref 0.7–4.0)
MCH: 28.1 pg (ref 26.0–34.0)
MCHC: 32.2 g/dL (ref 30.0–36.0)
MCV: 87.3 fL (ref 78.0–100.0)
MONO ABS: 1.4 10*3/uL — AB (ref 0.1–1.0)
Monocytes Relative: 13 %
NEUTROS ABS: 7.7 10*3/uL (ref 1.7–7.7)
NEUTROS PCT: 69 %
Platelets: 445 10*3/uL — ABNORMAL HIGH (ref 150–400)
RBC: 2.6 MIL/uL — ABNORMAL LOW (ref 3.87–5.11)
RDW: 16.5 % — AB (ref 11.5–15.5)
WBC: 11.1 10*3/uL — AB (ref 4.0–10.5)

## 2016-09-11 LAB — TYPE AND SCREEN
ABO/RH(D): O POS
Antibody Screen: NEGATIVE

## 2016-09-11 LAB — COMPREHENSIVE METABOLIC PANEL
ALK PHOS: 140 U/L — AB (ref 38–126)
ALT: 30 U/L (ref 14–54)
ANION GAP: 10 (ref 5–15)
AST: 48 U/L — ABNORMAL HIGH (ref 15–41)
Albumin: 3.2 g/dL — ABNORMAL LOW (ref 3.5–5.0)
BILIRUBIN TOTAL: 0.5 mg/dL (ref 0.3–1.2)
BUN: 21 mg/dL — ABNORMAL HIGH (ref 6–20)
CALCIUM: 8.5 mg/dL — AB (ref 8.9–10.3)
CO2: 22 mmol/L (ref 22–32)
Chloride: 104 mmol/L (ref 101–111)
Creatinine, Ser: 1.58 mg/dL — ABNORMAL HIGH (ref 0.44–1.00)
GFR, EST AFRICAN AMERICAN: 35 mL/min — AB (ref >60)
GFR, EST NON AFRICAN AMERICAN: 30 mL/min — AB (ref >60)
GLUCOSE: 118 mg/dL — AB (ref 65–99)
Potassium: 4.1 mmol/L (ref 3.5–5.1)
Sodium: 136 mmol/L (ref 135–145)
TOTAL PROTEIN: 6.9 g/dL (ref 6.5–8.1)

## 2016-09-11 LAB — ABO/RH: ABO/RH(D): O POS

## 2016-09-11 LAB — PROTIME-INR
INR: 2.37
Prothrombin Time: 26.3 seconds — ABNORMAL HIGH (ref 11.4–15.2)

## 2016-09-11 MED ORDER — ONDANSETRON HCL 4 MG PO TABS: 4.0000 mg | ORAL_TABLET | Freq: Four times a day (QID) | ORAL | Status: DC | PRN

## 2016-09-11 MED ORDER — ACETAMINOPHEN 325 MG PO TABS: 650.0000 mg | ORAL_TABLET | Freq: Four times a day (QID) | ORAL | Status: DC | PRN

## 2016-09-11 MED ORDER — PHYTONADIONE 1 MG/0.5 ML ORAL SOLUTION
1.0000 mg | Freq: Once | ORAL | Status: AC
  Administered 2016-09-11: 1 mg via ORAL
  Filled 2016-09-11: qty 0.5

## 2016-09-11 MED ORDER — ALPRAZOLAM 0.5 MG PO TABS
0.5000 mg | ORAL_TABLET | Freq: Two times a day (BID) | ORAL | Status: DC
  Administered 2016-09-11 – 2016-09-13 (×5): 0.5 mg via ORAL
  Filled 2016-09-11 (×5): qty 1

## 2016-09-11 MED ORDER — FENTANYL CITRATE (PF) 100 MCG/2ML IJ SOLN
25.0000 ug | INTRAMUSCULAR | Status: DC | PRN
  Administered 2016-09-11 – 2016-09-12 (×4): 25 ug via INTRAVENOUS
  Filled 2016-09-11 (×5): qty 2

## 2016-09-11 MED ORDER — HYDROCODONE-ACETAMINOPHEN 5-325 MG PO TABS
1.0000 | ORAL_TABLET | ORAL | Status: DC | PRN
  Administered 2016-09-11 (×2): 1 via ORAL
  Filled 2016-09-11 (×2): qty 1

## 2016-09-11 MED ORDER — HYDROCHLOROTHIAZIDE 12.5 MG PO CAPS
12.5000 mg | ORAL_CAPSULE | Freq: Every day | ORAL | Status: DC
  Administered 2016-09-11: 12.5 mg via ORAL
  Filled 2016-09-11: qty 1

## 2016-09-11 MED ORDER — SODIUM CHLORIDE 0.9 % IV SOLN
INTRAVENOUS | Status: DC
  Administered 2016-09-11: 07:00:00 via INTRAVENOUS

## 2016-09-11 MED ORDER — LAMOTRIGINE 100 MG PO TABS
100.0000 mg | ORAL_TABLET | Freq: Two times a day (BID) | ORAL | Status: DC
  Administered 2016-09-11 – 2016-09-14 (×8): 100 mg via ORAL
  Filled 2016-09-11 (×8): qty 1

## 2016-09-11 MED ORDER — TRAMADOL HCL 50 MG PO TABS
50.0000 mg | ORAL_TABLET | Freq: Once | ORAL | Status: AC
  Administered 2016-09-11: 50 mg via ORAL
  Filled 2016-09-11: qty 1

## 2016-09-11 MED ORDER — DULOXETINE HCL 60 MG PO CPEP
60.0000 mg | ORAL_CAPSULE | Freq: Every day | ORAL | Status: DC
  Administered 2016-09-11: 60 mg via ORAL
  Filled 2016-09-11: qty 1

## 2016-09-11 MED ORDER — WARFARIN - PHARMACIST DOSING INPATIENT: Freq: Every day | Status: DC

## 2016-09-11 MED ORDER — HYDRALAZINE HCL 20 MG/ML IJ SOLN: 10.0000 mg | INTRAMUSCULAR | Status: DC | PRN

## 2016-09-11 MED ORDER — LISINOPRIL-HYDROCHLOROTHIAZIDE 20-12.5 MG PO TABS: 1.0000 | ORAL_TABLET | Freq: Every day | ORAL | Status: DC

## 2016-09-11 MED ORDER — ONDANSETRON HCL 4 MG/2ML IJ SOLN
4.0000 mg | Freq: Four times a day (QID) | INTRAMUSCULAR | Status: DC | PRN
  Administered 2016-09-12 – 2016-09-14 (×3): 4 mg via INTRAVENOUS
  Filled 2016-09-11 (×3): qty 2

## 2016-09-11 MED ORDER — WARFARIN SODIUM 5 MG PO TABS: 5.0000 mg | ORAL_TABLET | ORAL | Status: DC

## 2016-09-11 MED ORDER — LISINOPRIL 20 MG PO TABS
20.0000 mg | ORAL_TABLET | Freq: Every day | ORAL | Status: DC
  Administered 2016-09-11: 20 mg via ORAL
  Filled 2016-09-11: qty 1

## 2016-09-11 MED ORDER — WARFARIN SODIUM 2.5 MG PO TABS: 2.5000 mg | ORAL_TABLET | ORAL | Status: DC

## 2016-09-11 MED ORDER — METOPROLOL TARTRATE 25 MG PO TABS
25.0000 mg | ORAL_TABLET | Freq: Two times a day (BID) | ORAL | Status: DC
  Administered 2016-09-11 – 2016-09-14 (×7): 25 mg via ORAL
  Filled 2016-09-11 (×8): qty 1

## 2016-09-11 MED ORDER — ACETAMINOPHEN 650 MG RE SUPP: 650.0000 mg | Freq: Four times a day (QID) | RECTAL | Status: DC | PRN

## 2016-09-11 MED ORDER — RIVASTIGMINE 4.6 MG/24HR TD PT24
4.6000 mg | MEDICATED_PATCH | Freq: Every day | TRANSDERMAL | Status: DC
  Administered 2016-09-11: 4.6 mg via TRANSDERMAL
  Filled 2016-09-11: qty 1

## 2016-09-11 NOTE — Progress Notes (Signed)
ANTICOAGULATION CONSULT NOTE - Initial Consult  Pharmacy Consult for warfarin Indication: pulmonary embolus, DVT and stroke  Allergies  Allergen Reactions  . Iohexol Other (See Comments)    Code: RASH, Desc: REMOTE H/O LIP TINGLING, NUMBNESS, AND HIVES WITH IV CM- 13 HR PRE-MED GIVEN AND PATIENT TOLERATED WELL- LY, Onset Date: [id number]   . Morphine And Related Other (See Comments)    Reaction:  Cardiac arrest   . Sulfonamide Derivatives Swelling and Other (See Comments)    Reaction:  Unspecified swelling reaction     Patient Measurements: Height: 5\' 1"  (154.9 cm) Weight: 145 lb 1.6 oz (65.8 kg) IBW/kg (Calculated) : 47.8 Heparin Dosing Weight:   Vital Signs: Temp: 97.5 F (36.4 C) (02/05 0001) Temp Source: Axillary (02/05 0001) BP: 124/52 (02/05 0001) Pulse Rate: 95 (02/05 0001)  Labs:  Recent Labs  09/10/16 1841 09/10/16 1924  HGB 8.1*  --   HCT 25.7*  --   PLT 510*  --   LABPROT  --  25.7*  INR  --  2.30  CREATININE 1.61*  --     Estimated Creatinine Clearance: 24.6 mL/min (by C-G formula based on SCr of 1.61 mg/dL (H)).   Medical History: Past Medical History:  Diagnosis Date  . Alzheimer disease   . Anemia    hx of  . Anxiety   . Arthritis   . Cancer ([place])   . Colon polyps 04/05/2010   Tubular adenomatous polyps  . Complication of anesthesia    "strong pain meds etc, causes dizziness"  . Coronary artery disease    sees Dr. Wynonia Lawman  . Depression   . Diverticulosis   . Dizziness and giddiness   . Elevated cholesterol   . Epilepsy [facility])    sees Dr. Erling Cruz  . Esophageal stricture   . GERD (gastroesophageal reflux disease) 04/05/2010  . H/O hiatal hernia    "had surgery past hx"  . Headache(784.0)    "occas"  . History of blood clots    sees Dr. Wynonia Lawman  . Hypertension    sees Dr. Marton Redwood  . Irritable bowel syndrome    hx of  . Osteoporosis, senile   . Peptic ulcer disease    hx of  . Pneumonia    "bilaterally pneumonia, hospitalized  15 years ago"  . Seizures ([place])   . Stroke ([place])    hx of blood clots  . Urinary tract infection    hx of    Medications:  Prescriptions Prior to Admission  Medication Sig Dispense Refill Last Dose  . ALPRAZolam (XANAX) 0.5 MG tablet Take 0.5 mg by mouth 2 (two) times daily.    09/10/2016 at Unknown time  . DULoxetine (CYMBALTA) 60 MG capsule Take 60 mg by mouth at bedtime.   09/09/2016 at Unknown time  . lamoTRIgine (LAMICTAL) 100 MG tablet Take 100 mg by mouth 2 (two) times daily.   09/10/2016 at Unknown time  . lisinopril-hydrochlorothiazide (PRINZIDE,ZESTORETIC) 20-12.5 MG per tablet Take 1 tablet by mouth at bedtime.    09/09/2016 at Unknown time  . metoprolol tartrate (LOPRESSOR) 25 MG tablet Take 25 mg by mouth 2 (two) times daily.    09/10/2016 at 1130  . rivastigmine (EXELON) 4.6 mg/24hr Place 4.6 mg onto the skin at bedtime. Pt applies to left upper back.   09/10/2016 at 0200  . warfarin (COUMADIN) 2.5 MG tablet Take 2.5 mg by mouth See admin instructions. Pt takes one tablet every morning on Sunday, Monday, Tuesday, Thursday, Friday, and  Saturday.   Pt takes one tablet twice daily on Wednesday.   09/10/2016 at 1130   Scheduled:  . ALPRAZolam  0.5 mg Oral BID  . DULoxetine  60 mg Oral QHS  . hydrochlorothiazide  12.5 mg Oral QHS  . lamoTRIgine  100 mg Oral BID  . lisinopril  20 mg Oral QHS  . metoprolol tartrate  25 mg Oral BID  . rivastigmine  4.6 mg Transdermal QHS  . warfarin  2.5 mg Oral Once per day on Sun Mon Tue Thu Fri Sat  . [START ON 09/13/2016] warfarin  5 mg Oral Once per day on Wed  . Warfarin - Pharmacist Dosing Inpatient   Does not apply q1800    Assessment: Patient on warfarin PTA for hx of DVT/PE and stroke.  INR at goal on admit.  Last dose warfarin on 2/4.    Goal of Therapy:  INR 2-3    Plan:  Continue home warfarin dosing Daily INR  Tyler Deis, Shea Stakes [place] 09/11/2016,2:28 AM

## 2016-09-11 NOTE — Progress Notes (Addendum)
[facility] PROGRESS NOTE    Progress Note  [name]  [id number] DOB: May 08, 1935 DOA: 09/10/2016 PCP: Marton Redwood, MD     Brief Narrative:   [name] is an 82 y.o. 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 Narcotics for pain.  Leiomyosarcoma of the liver with mets: CT of the abdomen and pelvis showed  with significant progression of the underlying neoplasm with a large pelvic and abdominal mass with intratumoral hemorrhage as well as progression of both hepatic and pulmonary metastatic lesions. New pancreatic lesion likely representing a metastatic focus with second primary could not be totally excluded. Mild fullness of the collecting systems of the kidneys bilaterally likely related to extrinsic compression on the distal ureters. Patient has a performance status, she live at home with her husband, who cannot take care of her. She relates her pain is not controlled and she cannot live like this. She would like to go for comfort route.  AKI: KVO  IV fluid hydration. Hold ace-I.  Anemia of chronic disease Her hemoglobin dropped from 8.1-> 7.3. Probably as she is bleeding into her mass.  Pulmonary embolism [facility]): Hold Coumadin due to CT scans finding. I have explained to the patient the risk and benefits and she has chosen to be off Coumadin. She relates that hospice [facility] had talked to her about a week or 2 prior to admission to take her off Coumadin. She says she has thought about it and would like to be taken off the Coumadin.  Seizures (HCC) Lamictal continue current regimen.   DVT prophylaxis: SCD's Family  Communication:none Disposition Plan/Barrier to D/C: home in am Code Status:     Code Status Orders        Start     Ordered   09/11/16 0144  Full code  Continuous     09/11/16 0144    Code Status History    Date Active Date Inactive Code Status Order ID Comments User Context   This patient has a current code status but no historical code status.    Advance Directive Documentation   Flowsheet Row Most Recent Value  Type of Advance Directive  Healthcare Power of Attorney, Living will  Pre-existing out of facility DNR order (yellow form or pink MOST form)  No data  "MOST" Form in Place?  No data        IV Access:    Peripheral IV   Procedures and diagnostic studies:   Ct Abdomen Pelvis Wo Contrast  Result Date: 09/11/2016 CLINICAL DATA:  History of multiple falls over the past few days EXAM: CT ABDOMEN AND PELVIS WITHOUT CONTRAST TECHNIQUE: Multidetector CT imaging of the abdomen and pelvis was performed following the standard protocol without IV contrast. COMPARISON:  03/21/2016 FINDINGS: Lower chest: Lung bases demonstrate multiple bilateral pulmonary metastatic lesions. The largest of these in the right lower lobe posteriorly measures approximately 4.8 cm. This is an increase from the prior exam a 3.1 cm consistent with progressive metastatic disease. Hepatobiliary: Diffuse hepatic metastatic disease is again identified. The largest of these lesions in the right hepatic lobe measures roughly 4.0 cm increased from 2.8 cm on  the prior study. The gallbladder has been surgically removed. Pancreas: Pancreas is well visualized. Adjacent to the inferior aspect of the pancreatic body there is a 2.2 cm soft tissue prominence which was not as well appreciated on the prior exam. This may represent some focal metastatic disease as well. Spleen: Multiple splenic granulomas are noted. Adrenals/Urinary Tract: The adrenal glands are within normal limits. Mild hydronephrosis is noted bilaterally  likely related to distal ureteral compression from the known abdominal mass lesion. Bladder is partially distended. Stomach/Bowel: No obstructive or inflammatory changes are seen. Diverticulosis is noted without diverticulitis. The appendix is not well appreciated. Postsurgical changes are noted consistent with the prior hiatal hernia surgery. Recurrent hiatal hernia is noted Vascular/Lymphatic: Aortic atherosclerosis. No enlarged abdominal or pelvic lymph nodes. Reproductive: The uterus is obscured by a large somewhat bilobed mass density which measures approximately 20 cm in transverse dimension. It measures at least 12 cm in greatest AP dimension and extends for approximately 23 cm in the craniocaudad projection. This is most consistent with progression of the patient's known leiomyosarcoma. Areas of increased density are noted within with some layering likely related to the intratumoral hemorrhage. A small 2.7 cm soft tissue nodule is noted in the pelvis adjacent to the rectum. Other: Minimal free pelvic fluid is noted. Musculoskeletal: Degenerative changes of the lumbar spine are noted. No definitive bony metastatic lesions are seen. IMPRESSION: Changes consistent with significant progression of the underlying neoplasm with a large pelvic and abdominal mass with intratumoral hemorrhage as well as progression of both hepatic and pulmonary metastatic lesions. New pancreatic lesion likely representing a metastatic focus with second primary could not be totally excluded. Mild fullness of the collecting systems of the kidneys bilaterally likely related to extrinsic compression on the distal ureters. These results will be called to the ordering clinician or representative by the Radiologist Assistant, and communication documented in the PACS or zVision Dashboard. Electronically Signed   By: Inez Catalina M.D.   On: 09/11/2016 08:54   Dg Chest 2 View  Result Date: 09/10/2016 CLINICAL DATA:  Weakness.  Cancer. EXAM:  CHEST  2 VIEW COMPARISON:  12/12/2015 chest radiograph. FINDINGS: Surgical clips overlie the left axilla. Stable cardiomediastinal silhouette with mild cardiomegaly and aortic atherosclerosis. No pneumothorax. No pleural effusion. There are multiple scattered pulmonary nodules throughout both lungs, largest in the mid to lower right lung, which appear increased in size since 12/12/2015 chest radiograph. No pulmonary edema. No acute consolidative airspace disease. IMPRESSION: 1. Interval growth of scattered bilateral pulmonary nodules compatible with progression of pulmonary metastases. 2. Stable mild cardiomegaly without overt pulmonary edema . 3. Aortic atherosclerosis. Electronically Signed   By: Ilona Sorrel M.D.   On: 09/10/2016 21:50   Dg Ankle Complete Right  Result Date: 09/10/2016 CLINICAL DATA:  Multiple falls this week. Right ankle pain and swelling, most prominent laterally. EXAM: RIGHT ANKLE - COMPLETE 3+ VIEW COMPARISON:  None. FINDINGS: No fracture or subluxation. No suspicious focal osseous lesion. Small Achilles right calcaneal spur. No radiopaque foreign body. IMPRESSION: No fracture or subluxation. Electronically Signed   By: Ilona Sorrel M.D.   On: 09/10/2016 18:52   Ct Head Wo Contrast  Result Date: 09/10/2016 CLINICAL DATA:  Recent fall EXAM: CT HEAD WITHOUT CONTRAST TECHNIQUE: Contiguous axial images were obtained from the base of the skull through the vertex without intravenous contrast. COMPARISON:  05/12/2016 FINDINGS: Brain: Mild atrophic changes are noted. Chronic white matter ischemic change is seen as well. These findings are stable from  the prior exam. No findings to suggest acute hemorrhage, acute infarction or space-occupying mass lesion are noted. Vascular: No hyperdense vessel or unexpected calcification. Skull: Normal. Negative for fracture or focal lesion. Sinuses/Orbits: No acute finding. Other: None. IMPRESSION: Chronic atrophic and ischemic changes. Electronically Signed    By: Inez Catalina M.D.   On: 09/10/2016 19:52   Mr Ankle Right Wo Contrast  Result Date: 09/11/2016 CLINICAL DATA:  Severe ankle pain due to multiple falls over the last 3 days. Metastatic leiomyosarcoma. EXAM: MRI OF THE RIGHT ANKLE WITHOUT CONTRAST TECHNIQUE: Multiplanar, multisequence MR imaging of the ankle was performed. No intravenous contrast was administered. COMPARISON:  Radiographs dated 09/10/2016 FINDINGS: TENDONS Peroneal: Intact peroneus longus and peroneus brevis tendons. Posteromedial: Intact tibialis posterior, flexor hallucis longus and flexor digitorum longus tendons. Anterior: Intact tibialis anterior, extensor hallucis longus and extensor digitorum longus tendons. Achilles: Intact. Plantar Fascia: Intact. LIGAMENTS Lateral: Sprain of the anterior talofibular ligament. The ligament is intact. Posterior talofibular ligament and calcaneofibular ligament are intact. Edema in the adjacent soft tissues. Medial: Intact. CARTILAGE Ankle Joint: Small ankle effusion. Subtalar Joints/Sinus Tarsi: Small subtalar joint effusion. Bones: No marrow signal abnormality. No fracture or dislocation. Other: No fluid collection or hematoma IMPRESSION: Slight sprain of the anterior talofibular ligament with edema in the adjacent soft tissues. Small ankle and subtalar joint effusions, nonspecific. Electronically Signed   By: Lorriane Shire M.D.   On: 09/11/2016 09:11     Medical Consultants:    None.  Anti-Infectives:   None.  Subjective:    Kenise Hiegel Schoneman she related her pain is not controlled especially in the abdomen she relates is really tender.  Objective:    Vitals:   09/10/16 2316 09/11/16 0001 09/11/16 0424 09/11/16 0520  BP: 121/59 (!) 124/52 130/79 123/62  Pulse: 102 95  81  Resp: (!) 27 20  20   Temp:  97.5 F (36.4 C)  98 F (36.7 C)  TempSrc:  Axillary  Oral  SpO2: 92% 90%  92%  Weight:  65.8 kg (145 lb 1.6 oz)    Height:  5\' 1"  (1.549 m)      Intake/Output Summary  (Last 24 hours) at 09/11/16 0915 Last data filed at 09/11/16 0022  Gross per 24 hour  Intake              500 ml  Output                0 ml  Net              500 ml   Filed Weights   09/10/16 1801 09/11/16 0001  Weight: 68 kg (150 lb) 65.8 kg (145 lb 1.6 oz)    Exam: General exam: In no acute distress. Respiratory system: Good air movement and clear to auscultation. Cardiovascular system: S1 & S2 heard, RRR. No JVD, murmurs, rubs, gallops or clicks.  Gastrointestinal system: Abdomen is distended, soft and diffuse tenderness no rebound or guarding. Extremities: No pedal edema. Skin: No rashes, lesions or ulcers Psychiatry: Judgement and insight appear normal. Mood & affect appropriate.    Data Reviewed:    Labs: Basic Metabolic Panel:  Recent Labs Lab 09/10/16 1841 09/11/16 0232  NA 135 136  K 4.4 4.1  CL 102 104  CO2 25 22  GLUCOSE 125* 118*  BUN 23* 21*  CREATININE 1.61* 1.58*  CALCIUM 8.7* 8.5*   GFR Estimated Creatinine Clearance: 25.1 mL/min (by C-G formula based on SCr of 1.58 mg/dL (H)).  Liver Function Tests:  Recent Labs Lab 09/10/16 1841 09/11/16 0232  AST 54* 48*  ALT 35 30  ALKPHOS 164* 140*  BILITOT 0.5 0.5  PROT 7.3 6.9  ALBUMIN 3.4* 3.2*   No results for input(s): LIPASE, AMYLASE in the last 168 hours. No results for input(s): AMMONIA in the last 168 hours. Coagulation profile  Recent Labs Lab 09/10/16 1924 09/11/16 0455  INR 2.30 2.37    CBC:  Recent Labs Lab 09/10/16 1841 09/11/16 0232  WBC 13.3* 11.1*  NEUTROABS 10.2* 7.7  HGB 8.1* 7.3*  HCT 25.7* 22.7*  MCV 87.4 87.3  PLT 510* 445*   Cardiac Enzymes: No results for input(s): CKTOTAL, CKMB, CKMBINDEX, TROPONINI in the last 168 hours. BNP (last 3 results) No results for input(s): PROBNP in the last 8760 hours. CBG: No results for input(s): GLUCAP in the last 168 hours. D-Dimer: No results for input(s): DDIMER in the last 72 hours. Hgb A1c: No results for  input(s): HGBA1C in the last 72 hours. Lipid Profile: No results for input(s): CHOL, HDL, LDLCALC, TRIG, CHOLHDL, LDLDIRECT in the last 72 hours. Thyroid function studies: No results for input(s): TSH, T4TOTAL, T3FREE, THYROIDAB in the last 72 hours.  Invalid input(s): FREET3 Anemia work up: No results for input(s): VITAMINB12, FOLATE, FERRITIN, TIBC, IRON, RETICCTPCT in the last 72 hours. Sepsis Labs:  Recent Labs Lab 09/10/16 1841 09/11/16 0232  WBC 13.3* 11.1*   Microbiology No results found for this or any previous visit (from the past 240 hour(s)).   Medications:   . ALPRAZolam  0.5 mg Oral BID  . DULoxetine  60 mg Oral QHS  . lamoTRIgine  100 mg Oral BID  . metoprolol tartrate  25 mg Oral BID  . rivastigmine  4.6 mg Transdermal QHS  . warfarin  2.5 mg Oral Once per day on Sun Mon Tue Thu Fri Sat  . [START ON 09/13/2016] warfarin  5 mg Oral Once per day on Wed  . Warfarin - Pharmacist Dosing Inpatient   Does not apply q1800   Continuous Infusions:  Time spent: 25 min   LOS: 0 days   Charlynne Cousins  [facility] Pager [phone]  *Please refer to [email or web address], password [email or web address] to get updated schedule on who will round on this patient, as hospitalists switch teams weekly. If 7PM-7AM, please contact night-coverage at [email or web address], password [email or web address] for any overnight needs.  09/11/2016, 9:15 AM

## 2016-09-11 NOTE — H&P (Signed)
History and Physical    [name] [id number] DOB: [birth year] DOA: 09/10/2016  PCP: Marton Redwood, MD  Patient coming from: Home.  Chief Complaint: Right ankle pain and fall and weakness.  HPI: [name] is a 82 y.o. 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 Patient had 2 falls in the last 3 days. Patient has been having progressively worsening pain in the right ankle. X-rays do not show anything acute. CT head was unremarkable. Due to patient feeling weak and increasing pain patient is being admitted for further observation. Patient also states that her abdominal pain has worsened over the last few days. Denies any nausea vomiting or diarrhea.  ED Course: CT head is unremarkable. Chest x-ray shows progression of metastasis. Right ankle x-ray was negative for any fracture. EKG shows normal sinus rhythm with acceptable QTC.  Review of Systems: As per HPI, rest all negative.   Past Medical History:  Diagnosis Date  . Alzheimer disease   . Anemia    hx of  . Anxiety   . Arthritis   . Cancer ([place])   . Colon polyps 04/05/2010   Tubular adenomatous polyps  . Complication of anesthesia    "strong pain meds etc, causes dizziness"  . Coronary artery disease    sees Dr. Wynonia Lawman  . Depression   . Diverticulosis   . Dizziness and giddiness   . Elevated cholesterol   . Epilepsy [facility])    sees Dr. Erling Cruz  . Esophageal stricture   . GERD (gastroesophageal reflux disease) 04/05/2010  . H/O hiatal hernia    "had surgery past hx"  . Headache(784.0)    "occas"  . History of blood clots    sees Dr. Wynonia Lawman  . Hypertension    sees Dr. Marton Redwood  . Irritable bowel syndrome    hx of    . Osteoporosis, senile   . Peptic ulcer disease    hx of  . Pneumonia    "bilaterally pneumonia, hospitalized 15 years ago"  . Seizures ([place])   . Stroke ([place])    hx of blood clots  . Urinary tract infection    hx of    Past Surgical History:  Procedure Laterality Date  . ARTERY BIOPSY Left 10/01/2012   Procedure: LEFT TEMPORAL ARTERY BIOPSY WITH ULTRASOUND PROBE;  Surgeon: Jerrell Belfast, MD;  Location: [place];  Service: ENT;  Laterality: Left;  . BREAST LUMPECTOMY Right    x 2  . BREAST SURGERY Left    lumpectomy  . CARDIAC CATHETERIZATION    . CHOLECYSTECTOMY    . CHOLECYSTECTOMY, LAPAROSCOPIC    . COLONOSCOPY    . DILATION AND CURETTAGE OF UTERUS    . EYE SURGERY     bilateral cataract surgery  . HERNIA REPAIR    . NISSEN FUNDOPLICATION    . ROTATOR CUFF REPAIR Left   . surgery to uterus     "patient unsure of term, states not fibroids"     reports that she has never smoked. She has never used smokeless tobacco. She reports that she does not drink alcohol or use drugs.  Allergies  Allergen Reactions  . Iohexol Other (See Comments)    Code: RASH,  Desc: REMOTE H/O LIP TINGLING, NUMBNESS, AND HIVES WITH IV CM- 13 HR PRE-MED GIVEN AND PATIENT TOLERATED WELL- LY, Onset Date: [id number]   . Morphine And Related Other (See Comments)    Reaction:  Cardiac arrest   . Sulfonamide Derivatives Swelling and Other (See Comments)    Reaction:  Unspecified swelling reaction     Family History  Problem Relation Age of Onset  . COPD Father   . Heart attack Father   . Heart disease Mother   . Colon polyps Mother   . Cancer Sister     leukemia  . Breast cancer Sister   . Colon polyps Sister   . Irritable bowel syndrome Sister     Prior to Admission medications   Medication Sig Start Date End Date Taking? Authorizing Provider  ALPRAZolam Duanne Moron) 0.5 MG tablet Take 0.5 mg by mouth 2 (two) times daily.    Yes Historical Provider, MD  DULoxetine (CYMBALTA) 60 MG capsule Take  60 mg by mouth at bedtime.   Yes Historical Provider, MD  lamoTRIgine (LAMICTAL) 100 MG tablet Take 100 mg by mouth 2 (two) times daily.   Yes Historical Provider, MD  lisinopril-hydrochlorothiazide (PRINZIDE,ZESTORETIC) 20-12.5 MG per tablet Take 1 tablet by mouth at bedtime.    Yes Historical Provider, MD  metoprolol tartrate (LOPRESSOR) 25 MG tablet Take 25 mg by mouth 2 (two) times daily.    Yes Historical Provider, MD  rivastigmine (EXELON) 4.6 mg/24hr Place 4.6 mg onto the skin at bedtime. Pt applies to left upper back.   Yes Historical Provider, MD  warfarin (COUMADIN) 2.5 MG tablet Take 2.5 mg by mouth See admin instructions. Pt takes one tablet every morning on Sunday, Monday, Tuesday, Thursday, Friday, and Saturday.   Pt takes one tablet twice daily on Wednesday.   Yes Historical Provider, MD    Physical Exam: Vitals:   09/10/16 2100 09/10/16 2315 09/10/16 2316 09/11/16 0001  BP: 145/66 121/59 121/59 (!) 124/52  Pulse: 94 104 102 95  Resp: 24 17 (!) 27 20  Temp:    97.5 F (36.4 C)  TempSrc:    Axillary  SpO2: 92% 90% 92% 90%  Weight:    65.8 kg (145 lb 1.6 oz)  Height:    5\' 1"  (1.549 m)      Constitutional: Moderately built and nourished. Vitals:   09/10/16 2100 09/10/16 2315 09/10/16 2316 09/11/16 0001  BP: 145/66 121/59 121/59 (!) 124/52  Pulse: 94 104 102 95  Resp: 24 17 (!) 27 20  Temp:    97.5 F (36.4 C)  TempSrc:    Axillary  SpO2: 92% 90% 92% 90%  Weight:    65.8 kg (145 lb 1.6 oz)  Height:    5\' 1"  (1.549 m)   Eyes: Anicteric no pallor. ENMT: No discharge from the ears eyes nose and mouth. Neck: No mass felt. No neck rigidity. Respiratory: No rhonchi or crepitations. Cardiovascular: S1-S2 heard no murmurs appreciated. Abdomen: Soft nontender bowel sounds present. Musculoskeletal: Pain on moving right ankle. Skin: Chronic skin changes. Neurologic: Alert awake oriented to time place and person. Moves all extremities. Psychiatric: Appears normal.  Normal affect.   Labs on Admission: I have personally reviewed following labs and imaging studies  CBC:  Recent Labs Lab 09/10/16 1841  WBC 13.3*  NEUTROABS 10.2*  HGB 8.1*  HCT 25.7*  MCV 87.4  PLT 99991111*   Basic Metabolic Panel:  Recent Labs Lab 09/10/16 1841  NA 135  K 4.4  CL  102  CO2 25  GLUCOSE 125*  BUN 23*  CREATININE 1.61*  CALCIUM 8.7*   GFR: Estimated Creatinine Clearance: 24.6 mL/min (by C-G formula based on SCr of 1.61 mg/dL (H)). Liver Function Tests:  Recent Labs Lab 09/10/16 1841  AST 54*  ALT 35  ALKPHOS 164*  BILITOT 0.5  PROT 7.3  ALBUMIN 3.4*   No results for input(s): LIPASE, AMYLASE in the last 168 hours. No results for input(s): AMMONIA in the last 168 hours. Coagulation Profile:  Recent Labs Lab 09/10/16 1924  INR 2.30   Cardiac Enzymes: No results for input(s): CKTOTAL, CKMB, CKMBINDEX, TROPONINI in the last 168 hours. BNP (last 3 results) No results for input(s): PROBNP in the last 8760 hours. HbA1C: No results for input(s): HGBA1C in the last 72 hours. CBG: No results for input(s): GLUCAP in the last 168 hours. Lipid Profile: No results for input(s): CHOL, HDL, LDLCALC, TRIG, CHOLHDL, LDLDIRECT in the last 72 hours. Thyroid Function Tests: No results for input(s): TSH, T4TOTAL, FREET4, T3FREE, THYROIDAB in the last 72 hours. Anemia Panel: No results for input(s): VITAMINB12, FOLATE, FERRITIN, TIBC, IRON, RETICCTPCT in the last 72 hours. Urine analysis:    Component Value Date/Time   COLORURINE YELLOW 09/10/2016 2043   APPEARANCEUR CLEAR 09/10/2016 2043   LABSPEC 1.017 09/10/2016 2043   PHURINE 5.0 09/10/2016 2043   GLUCOSEU NEGATIVE 09/10/2016 2043   HGBUR NEGATIVE 09/10/2016 2043   BILIRUBINUR NEGATIVE 09/10/2016 2043   [place] NEGATIVE 09/10/2016 2043   PROTEINUR NEGATIVE 09/10/2016 2043   UROBILINOGEN 0.2 12/08/2008 1343   NITRITE NEGATIVE 09/10/2016 2043   LEUKOCYTESUR NEGATIVE 09/10/2016 2043   Sepsis  Labs: @LABRCNTIP (procalcitonin:4,lacticidven:4) )No results found for this or any previous visit (from the past 240 hour(s)).   Radiological Exams on Admission: Dg Chest 2 View  Result Date: 09/10/2016 CLINICAL DATA:  Weakness.  Cancer. EXAM: CHEST  2 VIEW COMPARISON:  12/12/2015 chest radiograph. FINDINGS: Surgical clips overlie the left axilla. Stable cardiomediastinal silhouette with mild cardiomegaly and aortic atherosclerosis. No pneumothorax. No pleural effusion. There are multiple scattered pulmonary nodules throughout both lungs, largest in the mid to lower right lung, which appear increased in size since 12/12/2015 chest radiograph. No pulmonary edema. No acute consolidative airspace disease. IMPRESSION: 1. Interval growth of scattered bilateral pulmonary nodules compatible with progression of pulmonary metastases. 2. Stable mild cardiomegaly without overt pulmonary edema . 3. Aortic atherosclerosis. Electronically Signed   By: Ilona Sorrel M.D.   On: 09/10/2016 21:50   Dg Ankle Complete Right  Result Date: 09/10/2016 CLINICAL DATA:  Multiple falls this week. Right ankle pain and swelling, most prominent laterally. EXAM: RIGHT ANKLE - COMPLETE 3+ VIEW COMPARISON:  None. FINDINGS: No fracture or subluxation. No suspicious focal osseous lesion. Small Achilles right calcaneal spur. No radiopaque foreign body. IMPRESSION: No fracture or subluxation. Electronically Signed   By: Ilona Sorrel M.D.   On: 09/10/2016 18:52   Ct Head Wo Contrast  Result Date: 09/10/2016 CLINICAL DATA:  Recent fall EXAM: CT HEAD WITHOUT CONTRAST TECHNIQUE: Contiguous axial images were obtained from the base of the skull through the vertex without intravenous contrast. COMPARISON:  05/12/2016 FINDINGS: Brain: Mild atrophic changes are noted. Chronic white matter ischemic change is seen as well. These findings are stable from the prior exam. No findings to suggest acute hemorrhage, acute infarction or space-occupying mass  lesion are noted. Vascular: No hyperdense vessel or unexpected calcification. Skull: Normal. Negative for fracture or focal lesion. Sinuses/Orbits: No acute finding. Other: None. IMPRESSION:  Chronic atrophic and ischemic changes. Electronically Signed   By: Inez Catalina M.D.   On: 09/10/2016 19:52    EKG: Independently reviewed. Normal sinus rhythm.  Assessment/Plan Principal Problem:   Generalized weakness Active Problems:   Anemia of chronic disease   Pulmonary embolism (HCC)   Seizures (HCC)   Right foot pain    1. Generalized weakness with fall and right foot pain - since patient has significant pain I have ordered right ankle MRI. Patient is placed on fentanyl for pain relief. Get physical therapy consult and further recommendations based on MRI. Patient may need rehabilitation if difficult to ambulate. 2. History of PE and DVT on Coumadin. Coumadin being dosed per pharmacy. 3. History of seizures on Lamictal. 4. Chronic anemia - mild worsening. Follow CBC. 5. Hypertension - since patient's creatinine has worsened from baseline. I'm holding off lisinopril and hydrochlorothiazide for now. Continue metoprolol. When necessary IV hydralazine. 6. Chronic disease stage III - see #5. 7. Metastatic leiomyosarcoma - per oncology.   DVT prophylaxis: Coumadin. Code Status: Full code.  Family Communication: Discussed with patient and husband.  Disposition Plan: Home.  Consults called: Physical therapy.  Admission status: Observation.    Rise Patience MD [facility] Pager [phone].  If 7PM-7AM, please contact night-coverage [email or web address] Password [email or web address]  09/11/2016, 1:45 AM

## 2016-09-11 NOTE — Care Management Obs Status (Signed)
[place] NOTIFICATION   Patient Details  Name: [name] MRN: [id number] Date of Birth: 1935-07-18   Medicare Observation Status Notification Given:  Yes    MahabirJuliann Pulse, RN 09/11/2016, 3:15 PM

## 2016-09-11 NOTE — Progress Notes (Addendum)
[facility] [place] RN Visit @ 9AM  This is a covered, related GIP visit from 09/10/2016 to [facility] diagnosis of Leiomyosarcoma of Uterus, per Dr. Tomasa Hosteller.  Patient was brought to ED after having 2 falls and worsening abdominal pain.  Patient was admitted for generalized weakness and further work-up of Rt foot pain.  Patient in radiology getting multiple MRI's at time of visit.  Per chart review, chest x-ray shows progression of metastatis.  Patient has received 2 doses of 25 mcg Fentanyl for pain.  Spoke to hospice homecare RN, who confirmed that patient has had multiple recent falls and overall decline in functional status.  Phone call placed to 89, daughter, who verbalized concern of the family's ability to care for her in the home, given the recent decline.  She stated she is currently awaiting results from the MRI to see if the symptoms are related to disease progression.   [facility] medication list and transfer summary placed on chart.  Please call with hospice-related questions or concerns.  Thank you, Freddi Starr, RN, BSN [facility] Liaison [phone]  All hospital liaisons now live on [place].  Addendum: Spoke to Claiborne Billings, [place] who stated the attending MD feels patient may be [facility] appropriate.  Confirmed there is no bed availability today.  Plan to speak with patient and family in AM regarding interest in placement at [facility].

## 2016-09-11 NOTE — Progress Notes (Signed)
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  Abd CT showed progression of cancer with a "large pelvic and abdominal mass with intratumoral hemorrhage as well as progression of both hepatic and pulmonary metastatic lesions."  Plan: - Per Dr. Aileen Fass, to hold warfarin for now - Re-consult [place] if/when warfarin is resumed  Dia Sitter, PharmD, BCPS 09/11/2016 10:56 AM

## 2016-09-11 NOTE — Evaluation (Signed)
Physical Therapy Evaluation Patient Details Name: [name] MRN: [id number] DOB: 1935/01/08 Today's Date: 09/11/2016   History of Present Illness   82 y.o. 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  CT of the abdomen and pelvis showed  with significant progression of the underlying neoplasm with a large pelvic and abdominal mass with intratumoral hemorrhage as well as progression of both hepatic and pulmonary metastatic lesions  Clinical Impression  Pt admitted with above diagnosis. Pt currently with functional limitations due to the deficits listed below (see PT Problem List).  Pt will benefit from skilled PT to increase their independence and safety with mobility to allow discharge to the venue listed below.  Pt with increased R ankle pain with weight bearing and would benefit from w/c upon d/c for mobility and improving quality of life.  Pt with hospice care currently so recommend 24/7 assist upon d/c.     Follow Up Recommendations Supervision/Assistance - 24 hour (currently receiving hospice care)    Equipment Recommendations  Wheelchair cushion (measurements PT);Wheelchair (measurements PT)    Recommendations for Other Services       Precautions / Restrictions Precautions Precautions: Fall Restrictions Weight Bearing Restrictions: No Other Position/Activity Restrictions: WBAT R foot - verbal per Dr. Aileen Fass      Mobility  Bed Mobility Overal bed mobility: Needs Assistance Bed Mobility: Sit to Supine;Supine to Sit     Supine to sit: Mod assist Sit to supine: Mod assist   General bed mobility comments: assist for trunk upright, assist for LEs onto bed  Transfers Overall  transfer level: Needs assistance Equipment used: Rolling walker (2 wheeled) Transfers: Sit to/from [employer] Transfers Sit to Stand: Min assist;+2 safety/equipment Stand pivot transfers: Min assist;+2 safety/equipment       General transfer comment: verbal cues for safe technique, pt with increased pain R ankle from bed to [facility] - weight bearing more on R LE, upon assist back to bed less pain however pt was performing more of TDWB status, assist for weakness and steadying  Ambulation/Gait                Stairs            Wheelchair Mobility    Modified Rankin (Stroke Patients Only)       Balance Overall balance assessment: History of Falls                                           Pertinent Vitals/Pain Pain Assessment: Faces Faces Pain Scale: Hurts whole lot Pain Location: R ankle esp with weight bearing Pain Descriptors / Indicators: Tender;Sore Pain Intervention(s): Limited activity within patient's tolerance;Monitored during session;Repositioned;Ice applied (elevated on pillows)    Home Living Family/patient expects to be discharged to:: Private residence Living Arrangements: Spouse/significant other             Home Equipment: Environmental consultant - 2 wheels      Prior Function Level of Independence: Independent with assistive device(s)         Comments: typically uses RW, has had falls prior to admission     Hand Dominance  Extremity/Trunk Assessment        Lower Extremity Assessment Lower Extremity Assessment: Generalized weakness;RLE deficits/detail;LLE deficits/detail RLE Deficits / Details: R ankle rests in inverted position - unable to actively perform neutral ankle alignment       Communication   Communication: No difficulties  Cognition Arousal/Alertness: Awake/alert Behavior During Therapy: WFL for tasks assessed/performed Overall Cognitive Status: Within Functional Limits for tasks assessed                       General Comments      Exercises     Assessment/Plan    PT Assessment Patient needs continued PT services  PT Problem List Decreased strength;Decreased activity tolerance;Decreased mobility;Pain;Decreased knowledge of use of DME          PT Treatment Interventions DME instruction;Gait training;Therapeutic activities;Therapeutic exercise;Wheelchair mobility training;Functional mobility training;Patient/family education    PT Goals (Current goals can be found in the Care Plan section)  Acute Rehab PT Goals PT Goal Formulation: With patient Time For Goal Achievement: 09/18/16 Potential to Achieve Goals: Good    Frequency Min 3X/week   Barriers to discharge        Co-evaluation               End of Session Equipment Utilized During Treatment: Gait belt Activity Tolerance: Patient limited by pain Patient left: in bed;with call bell/phone within reach;with bed alarm set Nurse Communication: Mobility status;Patient requests pain meds    Functional Assessment Tool Used: clinical judgement Functional Limitation: Mobility: Walking and moving around Mobility: Walking and Moving Around Current Status [phone]): At least 40 percent but less than 60 percent impaired, limited or restricted Mobility: Walking and Moving Around Goal Status [phone]): At least 20 percent but less than 40 percent impaired, limited or restricted    Time: [id number] PT Time Calculation (min) (ACUTE ONLY): 23 min   Charges:   PT Evaluation $PT Eval Moderate Complexity: 1 Procedure     PT G Codes:   PT G-Codes **NOT FOR INPATIENT CLASS** Functional Assessment Tool Used: clinical judgement Functional Limitation: Mobility: Walking and moving around Mobility: Walking and Moving Around Current Status [id number]): At least 40 percent but less than 60 percent impaired, limited or restricted Mobility: Walking and Moving Around Goal Status [phone]): At least 20 percent but less than 40 percent  impaired, limited or restricted    Najib Colmenares,KATHrine E 09/11/2016, 12:52 PM Carmelia Bake, PT, DPT 09/11/2016 Pager: [phone]

## 2016-09-12 DIAGNOSIS — R296 Repeated falls: Secondary | ICD-10-CM | POA: Diagnosis present

## 2016-09-12 DIAGNOSIS — R531 Weakness: Secondary | ICD-10-CM | POA: Diagnosis not present

## 2016-09-12 DIAGNOSIS — N183 Chronic kidney disease, stage 3 (moderate): Secondary | ICD-10-CM | POA: Diagnosis present

## 2016-09-12 DIAGNOSIS — R109 Unspecified abdominal pain: Secondary | ICD-10-CM

## 2016-09-12 DIAGNOSIS — Z8674 Personal history of sudden cardiac arrest: Secondary | ICD-10-CM | POA: Diagnosis not present

## 2016-09-12 DIAGNOSIS — C228 Malignant neoplasm of liver, primary, unspecified as to type: Secondary | ICD-10-CM | POA: Diagnosis present

## 2016-09-12 DIAGNOSIS — F0280 Dementia in other diseases classified elsewhere without behavioral disturbance: Secondary | ICD-10-CM | POA: Diagnosis present

## 2016-09-12 DIAGNOSIS — N179 Acute kidney failure, unspecified: Secondary | ICD-10-CM | POA: Diagnosis present

## 2016-09-12 DIAGNOSIS — G309 Alzheimer's disease, unspecified: Secondary | ICD-10-CM | POA: Diagnosis present

## 2016-09-12 DIAGNOSIS — S93402A Sprain of unspecified ligament of left ankle, initial encounter: Secondary | ICD-10-CM | POA: Diagnosis present

## 2016-09-12 DIAGNOSIS — C7801 Secondary malignant neoplasm of right lung: Secondary | ICD-10-CM | POA: Diagnosis present

## 2016-09-12 DIAGNOSIS — C7802 Secondary malignant neoplasm of left lung: Secondary | ICD-10-CM | POA: Diagnosis present

## 2016-09-12 DIAGNOSIS — W19XXXA Unspecified fall, initial encounter: Secondary | ICD-10-CM | POA: Diagnosis present

## 2016-09-12 DIAGNOSIS — I129 Hypertensive chronic kidney disease with stage 1 through stage 4 chronic kidney disease, or unspecified chronic kidney disease: Secondary | ICD-10-CM | POA: Diagnosis present

## 2016-09-12 DIAGNOSIS — D638 Anemia in other chronic diseases classified elsewhere: Secondary | ICD-10-CM | POA: Diagnosis present

## 2016-09-12 DIAGNOSIS — Z79899 Other long term (current) drug therapy: Secondary | ICD-10-CM | POA: Diagnosis not present

## 2016-09-12 DIAGNOSIS — Z86718 Personal history of other venous thrombosis and embolism: Secondary | ICD-10-CM | POA: Diagnosis not present

## 2016-09-12 DIAGNOSIS — I2510 Atherosclerotic heart disease of native coronary artery without angina pectoris: Secondary | ICD-10-CM | POA: Diagnosis present

## 2016-09-12 DIAGNOSIS — Z515 Encounter for palliative care: Secondary | ICD-10-CM | POA: Diagnosis present

## 2016-09-12 DIAGNOSIS — F329 Major depressive disorder, single episode, unspecified: Secondary | ICD-10-CM | POA: Diagnosis present

## 2016-09-12 DIAGNOSIS — E860 Dehydration: Secondary | ICD-10-CM | POA: Diagnosis present

## 2016-09-12 DIAGNOSIS — M2540 Effusion, unspecified joint: Secondary | ICD-10-CM | POA: Diagnosis present

## 2016-09-12 DIAGNOSIS — Z7901 Long term (current) use of anticoagulants: Secondary | ICD-10-CM | POA: Diagnosis not present

## 2016-09-12 DIAGNOSIS — F419 Anxiety disorder, unspecified: Secondary | ICD-10-CM | POA: Diagnosis present

## 2016-09-12 DIAGNOSIS — M810 Age-related osteoporosis without current pathological fracture: Secondary | ICD-10-CM | POA: Diagnosis present

## 2016-09-12 DIAGNOSIS — G40909 Epilepsy, unspecified, not intractable, without status epilepticus: Secondary | ICD-10-CM | POA: Diagnosis present

## 2016-09-12 DIAGNOSIS — K219 Gastro-esophageal reflux disease without esophagitis: Secondary | ICD-10-CM | POA: Diagnosis present

## 2016-09-12 DIAGNOSIS — I2699 Other pulmonary embolism without acute cor pulmonale: Secondary | ICD-10-CM | POA: Diagnosis not present

## 2016-09-12 LAB — PROTIME-INR
INR: 1.56
PROTHROMBIN TIME: 18.8 s — AB (ref 11.4–15.2)

## 2016-09-12 LAB — CBC
HCT: 24.2 % — ABNORMAL LOW (ref 36.0–46.0)
HEMOGLOBIN: 7.9 g/dL — AB (ref 12.0–15.0)
MCH: 29.4 pg (ref 26.0–34.0)
MCHC: 32.6 g/dL (ref 30.0–36.0)
MCV: 90 fL (ref 78.0–100.0)
PLATELETS: 468 10*3/uL — AB (ref 150–400)
RBC: 2.69 MIL/uL — AB (ref 3.87–5.11)
RDW: 16.8 % — ABNORMAL HIGH (ref 11.5–15.5)
WBC: 15 10*3/uL — ABNORMAL HIGH (ref 4.0–10.5)

## 2016-09-12 MED ORDER — OXYCODONE HCL 5 MG PO TABS
5.0000 mg | ORAL_TABLET | ORAL | Status: DC | PRN
  Administered 2016-09-13 – 2016-09-14 (×2): 5 mg via ORAL
  Filled 2016-09-12 (×2): qty 1

## 2016-09-12 MED ORDER — MORPHINE SULFATE ER 15 MG PO TBCR: 15.0000 mg | EXTENDED_RELEASE_TABLET | Freq: Two times a day (BID) | ORAL | Status: DC

## 2016-09-12 MED ORDER — OXYCODONE HCL ER 10 MG PO T12A
10.0000 mg | EXTENDED_RELEASE_TABLET | Freq: Two times a day (BID) | ORAL | Status: DC
  Administered 2016-09-12 – 2016-09-13 (×3): 10 mg via ORAL
  Filled 2016-09-12 (×3): qty 1

## 2016-09-12 NOTE — Progress Notes (Signed)
Home Care SW made visit to Patient, Husband was present.  SW also spoke to [employer] [employer] Daughter, Tessie Fass, by phone.  Patient and Husband were talking about where she would go when she is discharged.  He stated he is not sure he can keep her at home, Patient stated if she needed to go to [facility] she would as she is concerned with how her husband will be able to care for her at home.  Their Daughter is asking to talk to her Mother alone before any decisions are made as she would like for her mother to go home if possible.  SW will continue to follow and will assess for [facility] as necessary.  Albertina Senegal, [place] [facility] [place]

## 2016-09-12 NOTE — Progress Notes (Signed)
Pt c/o abdomen feeling "tight, like its about to pop." Has foley catheter in place. Draining. Bladder scan volume showed >700, but kept showing an arrow to go up on the abdomen. Dr. Olevia Bowens paged. Day shift nurse Rip Harbour made aware.

## 2016-09-12 NOTE — Progress Notes (Signed)
[facility] [place]GIP RN Visit @ 10:30 AM  This is a covered, related GIP visit from 09/10/2016 to [facility] diagnosis of Leiomyosarcoma of Uterus, per Dr. Tomasa Hosteller. Patient was brought to ED after having 2 falls and worsening abdominal pain.  Patient was admitted for generalized weakness and further work-up of Rt foot pain.  Visited with patient in room with hospice SW, [place].  Patient verbalized that she understands the severity of her illness and wants to be comfortable.  She is currently in discussions with her family to see if they might be able to care for her in the home vs placement at [facility].  Hospice SW following.  Patient denies any pain. Patient has received 2 doses of 25 mcg Fentanyl IV and 2 5/325 mg Norcos for pain in the past 24 hours, per chart review.  Will update hospital SW when family comes to a decision.    [facility] medication list and transfer summary placed on chart.  Please call with hospice-related questions or concerns.  Thank you, Freddi Starr, RN, BSN [facility] Liaison [phone]  All hospital liaisons now live on [place].

## 2016-09-12 NOTE — Progress Notes (Addendum)
[facility] PROGRESS NOTE    Progress Note  [name]  [id number] DOB: 09-Aug-1934 DOA: 09/10/2016 PCP: Marton Redwood, MD     Brief Narrative:   [name] is an 82 y.o. 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, patient has refused chemotherapy, with a history of DVT and PE on Coumadin, chronic kidney disease stage III who is brought into the hospital for 2 falls in the last 3 days, She has been significantly weak at home, CT scan of the abdomen and pelvis was done that showed a significant progression of her underlying neoplasm of the large pelvic mass and abdominal mass with intratumoral hemorrhage as well as progression of her both hepatic and pulmonary metastases with a new pancreatic lesion, there is also mild fullness of the collecting system in the kidneys bilaterally probably due to extrinsic complexion compression. She has a poor performance she lives at home with her husband who cannot take care of her she relates her pain is not controlled we have increased her narcotics, we have contacted hosp[facility] of [place] to help managing her pain. The family has not decided whether they would want her out of facility for care manager at home. The family and hosp[facility] of [place] have a meeting today about how to proceed her management as an outpatient.  Assessment/Plan:   Generalized weakness/falls and right foot pain: MRI of the ankle showed a sprain with a mild joint effusion. Narcotics for pain.  Leiomyosarcoma of the liver with metsAnd abdominal pain: Pain is not controlled we'll go ahead and start her on Oxytrol twice a day long-acting plus short-acting for breakthrough pain.  Her abdominal pain is likely due to her abdominal mass with intratumoral hemorrhage, I will continue to increase and titrate her narcotics to get better control of her pain. Will reevaluate in the morning.  AKI: KVO  IV fluid hydration. DC  Ace-I.  Anemia of chronic disease Her hemoglobin dropped from 8.1-> 7.3. Probably as she is bleeding into her mass, is currently asymptomatic.   Pulmonary embolism [facility]): Her INR has been reverse  CT scans finding.  I have explained to the patient the risk and benefits and she has chosen to be off Coumadin. She relates that hospice [facility] had talked to her about a week or 2 prior to admission to take her off Coumadin. She says she has thought about it and would like to be taken off the Coumadin.  Seizures (HCC) Lamictal continue current regimen.   DVT prophylaxis: SCD's Family Communication:none Disposition Plan/Barrier to D/C: home in am, Once the logistics of where she would go and where we can manage her needs is completed  Code Status:     Code Status Orders        Start     Ordered   09/11/16 0144  Full code  Continuous     09/11/16 0144    Code Status History    Date Active Date Inactive Code Status Order ID Comments User Context   This patient has a current code status but no historical code status.    Advance Directive Documentation   Flowsheet Row Most Recent Value  Type of Advance Directive  Healthcare Power of Attorney, Living will  Pre-existing out of facility DNR order (yellow form or pink MOST form)  No data  "MOST" Form in Place?  No data        IV Access:    Peripheral IV   Procedures  and diagnostic studies:   Ct Abdomen Pelvis Wo Contrast  Result Date: 09/11/2016 CLINICAL DATA:  History of multiple falls over the past few days EXAM: CT ABDOMEN AND PELVIS WITHOUT CONTRAST TECHNIQUE: Multidetector CT imaging of the abdomen and pelvis was performed following the standard protocol without IV contrast. COMPARISON:  03/21/2016 FINDINGS: Lower chest: Lung bases demonstrate multiple bilateral pulmonary metastatic lesions. The largest of these in the right lower lobe posteriorly measures approximately 4.8 cm. This is an increase from the prior exam a  3.1 cm consistent with progressive metastatic disease. Hepatobiliary: Diffuse hepatic metastatic disease is again identified. The largest of these lesions in the right hepatic lobe measures roughly 4.0 cm increased from 2.8 cm on the prior study. The gallbladder has been surgically removed. Pancreas: Pancreas is well visualized. Adjacent to the inferior aspect of the pancreatic body there is a 2.2 cm soft tissue prominence which was not as well appreciated on the prior exam. This may represent some focal metastatic disease as well. Spleen: Multiple splenic granulomas are noted. Adrenals/Urinary Tract: The adrenal glands are within normal limits. Mild hydronephrosis is noted bilaterally likely related to distal ureteral compression from the known abdominal mass lesion. Bladder is partially distended. Stomach/Bowel: No obstructive or inflammatory changes are seen. Diverticulosis is noted without diverticulitis. The appendix is not well appreciated. Postsurgical changes are noted consistent with the prior hiatal hernia surgery. Recurrent hiatal hernia is noted Vascular/Lymphatic: Aortic atherosclerosis. No enlarged abdominal or pelvic lymph nodes. Reproductive: The uterus is obscured by a large somewhat bilobed mass density which measures approximately 20 cm in transverse dimension. It measures at least 12 cm in greatest AP dimension and extends for approximately 23 cm in the craniocaudad projection. This is most consistent with progression of the patient's known leiomyosarcoma. Areas of increased density are noted within with some layering likely related to the intratumoral hemorrhage. A small 2.7 cm soft tissue nodule is noted in the pelvis adjacent to the rectum. Other: Minimal free pelvic fluid is noted. Musculoskeletal: Degenerative changes of the lumbar spine are noted. No definitive bony metastatic lesions are seen. IMPRESSION: Changes consistent with significant progression of the underlying neoplasm with a  large pelvic and abdominal mass with intratumoral hemorrhage as well as progression of both hepatic and pulmonary metastatic lesions. New pancreatic lesion likely representing a metastatic focus with second primary could not be totally excluded. Mild fullness of the collecting systems of the kidneys bilaterally likely related to extrinsic compression on the distal ureters. These results will be called to the ordering clinician or representative by the Radiologist Assistant, and communication documented in the PACS or zVision Dashboard. Electronically Signed   By: Inez Catalina M.D.   On: 09/11/2016 08:54   Dg Chest 2 View  Result Date: 09/10/2016 CLINICAL DATA:  Weakness.  Cancer. EXAM: CHEST  2 VIEW COMPARISON:  12/12/2015 chest radiograph. FINDINGS: Surgical clips overlie the left axilla. Stable cardiomediastinal silhouette with mild cardiomegaly and aortic atherosclerosis. No pneumothorax. No pleural effusion. There are multiple scattered pulmonary nodules throughout both lungs, largest in the mid to lower right lung, which appear increased in size since 12/12/2015 chest radiograph. No pulmonary edema. No acute consolidative airspace disease. IMPRESSION: 1. Interval growth of scattered bilateral pulmonary nodules compatible with progression of pulmonary metastases. 2. Stable mild cardiomegaly without overt pulmonary edema . 3. Aortic atherosclerosis. Electronically Signed   By: Ilona Sorrel M.D.   On: 09/10/2016 21:50   Dg Ankle Complete Right  Result Date: 09/10/2016 CLINICAL DATA:  Multiple falls this week. Right ankle pain and swelling, most prominent laterally. EXAM: RIGHT ANKLE - COMPLETE 3+ VIEW COMPARISON:  None. FINDINGS: No fracture or subluxation. No suspicious focal osseous lesion. Small Achilles right calcaneal spur. No radiopaque foreign body. IMPRESSION: No fracture or subluxation. Electronically Signed   By: Ilona Sorrel M.D.   On: 09/10/2016 18:52   Ct Head Wo Contrast  Result Date:  09/10/2016 CLINICAL DATA:  Recent fall EXAM: CT HEAD WITHOUT CONTRAST TECHNIQUE: Contiguous axial images were obtained from the base of the skull through the vertex without intravenous contrast. COMPARISON:  05/12/2016 FINDINGS: Brain: Mild atrophic changes are noted. Chronic white matter ischemic change is seen as well. These findings are stable from the prior exam. No findings to suggest acute hemorrhage, acute infarction or space-occupying mass lesion are noted. Vascular: No hyperdense vessel or unexpected calcification. Skull: Normal. Negative for fracture or focal lesion. Sinuses/Orbits: No acute finding. Other: None. IMPRESSION: Chronic atrophic and ischemic changes. Electronically Signed   By: Inez Catalina M.D.   On: 09/10/2016 19:52   Mr Ankle Right Wo Contrast  Result Date: 09/11/2016 CLINICAL DATA:  Severe ankle pain due to multiple falls over the last 3 days. Metastatic leiomyosarcoma. EXAM: MRI OF THE RIGHT ANKLE WITHOUT CONTRAST TECHNIQUE: Multiplanar, multisequence MR imaging of the ankle was performed. No intravenous contrast was administered. COMPARISON:  Radiographs dated 09/10/2016 FINDINGS: TENDONS Peroneal: Intact peroneus longus and peroneus brevis tendons. Posteromedial: Intact tibialis posterior, flexor hallucis longus and flexor digitorum longus tendons. Anterior: Intact tibialis anterior, extensor hallucis longus and extensor digitorum longus tendons. Achilles: Intact. Plantar Fascia: Intact. LIGAMENTS Lateral: Sprain of the anterior talofibular ligament. The ligament is intact. Posterior talofibular ligament and calcaneofibular ligament are intact. Edema in the adjacent soft tissues. Medial: Intact. CARTILAGE Ankle Joint: Small ankle effusion. Subtalar Joints/Sinus Tarsi: Small subtalar joint effusion. Bones: No marrow signal abnormality. No fracture or dislocation. Other: No fluid collection or hematoma IMPRESSION: Slight sprain of the anterior talofibular ligament with edema in the  adjacent soft tissues. Small ankle and subtalar joint effusions, nonspecific. Electronically Signed   By: Lorriane Shire M.D.   On: 09/11/2016 09:11     Medical Consultants:    None.  Anti-Infectives:   None.  Subjective:    Aundra Baisden Carmical she related her pain is not controlled especially in the abdomen she relates is really tender.  Objective:    Vitals:   09/11/16 0520 09/11/16 1403 09/11/16 2141 09/12/16 0415  BP: 123/62 (!) 104/39 (!) 97/45 (!) 120/55  Pulse: 81 77 88 76  Resp: 20 18 16 18   Temp: 98 F (36.7 C) 98.8 F (37.1 C) 98.4 F (36.9 C) 98.3 F (36.8 C)  TempSrc: Oral Oral Oral Oral  SpO2: 92% 92% 93% 92%  Weight:      Height:        Intake/Output Summary (Last 24 hours) at 09/12/16 1059 Last data filed at 09/12/16 [id number]  Gross per 24 hour  Intake              195 ml  Output              600 ml  Net             -405 ml   Filed Weights   09/10/16 1801 09/11/16 0001  Weight: 68 kg (150 lb) 65.8 kg (145 lb 1.6 oz)    Exam: General exam: In no acute distress, cachectic  Respiratory system: Good air movement and clear  to auscultation. Cardiovascular system: S1 & S2 heard, RRR. - JVD Gastrointestinal system: Abdomen is distended, soft and diffuse tenderness no rebound or guarding. Extremities: No pedal edema. Skin: No rashes, lesions or ulcers Psychiatry: Judgement and insight appear normal. Mood & affect appropriate.    Data Reviewed:    Labs: Basic Metabolic Panel:  Recent Labs Lab 09/10/16 1841 09/11/16 0232  NA 135 136  K 4.4 4.1  CL 102 104  CO2 25 22  GLUCOSE 125* 118*  BUN 23* 21*  CREATININE 1.61* 1.58*  CALCIUM 8.7* 8.5*   GFR Estimated Creatinine Clearance: 25.1 mL/min (by C-G formula based on SCr of 1.58 mg/dL (H)). Liver Function Tests:  Recent Labs Lab 09/10/16 1841 09/11/16 0232  AST 54* 48*  ALT 35 30  ALKPHOS 164* 140*  BILITOT 0.5 0.5  PROT 7.3 6.9  ALBUMIN 3.4* 3.2*   No results for input(s): LIPASE,  AMYLASE in the last 168 hours. No results for input(s): AMMONIA in the last 168 hours. Coagulation profile  Recent Labs Lab 09/10/16 1924 09/11/16 0455 09/12/16 0832  INR 2.30 2.37 1.56    CBC:  Recent Labs Lab 09/10/16 1841 09/11/16 0232 09/12/16 0831  WBC 13.3* 11.1* 15.0*  NEUTROABS 10.2* 7.7  --   HGB 8.1* 7.3* 7.9*  HCT 25.7* 22.7* 24.2*  MCV 87.4 87.3 90.0  PLT 510* 445* 468*   Cardiac Enzymes: No results for input(s): CKTOTAL, CKMB, CKMBINDEX, TROPONINI in the last 168 hours. BNP (last 3 results) No results for input(s): PROBNP in the last 8760 hours. CBG: No results for input(s): GLUCAP in the last 168 hours. D-Dimer: No results for input(s): DDIMER in the last 72 hours. Hgb A1c: No results for input(s): HGBA1C in the last 72 hours. Lipid Profile: No results for input(s): CHOL, HDL, LDLCALC, TRIG, CHOLHDL, LDLDIRECT in the last 72 hours. Thyroid function studies: No results for input(s): TSH, T4TOTAL, T3FREE, THYROIDAB in the last 72 hours.  Invalid input(s): FREET3 Anemia work up: No results for input(s): VITAMINB12, FOLATE, FERRITIN, TIBC, IRON, RETICCTPCT in the last 72 hours. Sepsis Labs:  Recent Labs Lab 09/10/16 1841 09/11/16 0232 09/12/16 0831  WBC 13.3* 11.1* 15.0*   Microbiology No results found for this or any previous visit (from the past 240 hour(s)).   Medications:   . ALPRAZolam  0.5 mg Oral BID  . lamoTRIgine  100 mg Oral BID  . metoprolol tartrate  25 mg Oral BID  . oxyCODONE  10 mg Oral Q12H   Continuous Infusions:  Time spent: 25 min   LOS: 0 days   Charlynne Cousins  [facility] Pager [phone]  *Please refer to [email or web address], password [email or web address] to get updated schedule on who will round on this patient, as hospitalists switch teams weekly. If 7PM-7AM, please contact night-coverage at [email or web address], password [email or web address] for any overnight needs.  09/12/2016, 10:59 AM

## 2016-09-12 NOTE — Progress Notes (Signed)
Patients daughter was concerned regarding discharge for patient tomorrow. Wondering about the catheter, her right ankle, whether or not she would need a brace/rehab for it and had some questions for the Education officer, museum. I informed her that I would make a note in her chart and pass this information on to the day shift nurse in the am. She was appreciative of that.

## 2016-09-12 NOTE — Care Management Note (Signed)
Case Management Note  Patient Details  Name: [name] MRN: [id number] Date of Birth: Jun 13, 1935  Subjective/Objective:  82 y/o f admitted w/Gen'l weakness. From home w/hospice([facility]) rep Stacy aware & following. Will need ambulance transp @ d/c if d/c plan is to home.                  Action/Plan:d/c home w/hospice.   Expected Discharge Date:                  Expected Discharge Plan:  Home w Hospice Care  In-House Referral:  Clinical Social Work  Discharge planning Services  CM Consult  Post Acute Care Choice:  Hospice (Active w/[facility]) Choice offered to:     DME Arranged:    DME Agency:     HH Arranged:    HH Agency:     Status of Service:  In process, will continue to follow  If discussed at Long Length of Stay Meetings, dates discussed:    Additional Comments:  Dessa Phi, RN 09/12/2016, 9:12 AM

## 2016-09-13 ENCOUNTER — Inpatient Hospital Stay (HOSPITAL_COMMUNITY)

## 2016-09-13 LAB — TROPONIN I: Troponin I: <0.03 ng/mL (ref <0.03)

## 2016-09-13 MED ORDER — ALPRAZOLAM 0.5 MG PO TABS
0.5000 mg | ORAL_TABLET | Freq: Two times a day (BID) | ORAL | 0 refills | Status: AC | PRN
Start: 2016-09-13 — End: ?

## 2016-09-13 MED ORDER — OXYCODONE HCL ER 10 MG PO T12A: 10.0000 mg | EXTENDED_RELEASE_TABLET | Freq: Two times a day (BID) | ORAL | 0 refills | Status: DC

## 2016-09-13 MED ORDER — SENNA 8.6 MG PO TABS
1.0000 | ORAL_TABLET | Freq: Two times a day (BID) | ORAL | Status: DC
  Administered 2016-09-13 – 2016-09-14 (×2): 8.6 mg via ORAL
  Filled 2016-09-13 (×3): qty 1

## 2016-09-13 MED ORDER — ALPRAZOLAM 0.5 MG PO TABS
0.5000 mg | ORAL_TABLET | Freq: Two times a day (BID) | ORAL | Status: DC | PRN
  Administered 2016-09-13: 0.5 mg via ORAL
  Filled 2016-09-13 (×2): qty 1

## 2016-09-13 MED ORDER — OXYCODONE HCL 5 MG PO TABS: 5.0000 mg | ORAL_TABLET | ORAL | 0 refills | Status: AC | PRN

## 2016-09-13 MED ORDER — ONDANSETRON 4 MG PO TBDP: 4.0000 mg | ORAL_TABLET | Freq: Three times a day (TID) | ORAL | 0 refills | Status: AC | PRN

## 2016-09-13 MED ORDER — FENTANYL CITRATE (PF) 100 MCG/2ML IJ SOLN: 25.0000 ug | Freq: Once | INTRAMUSCULAR | Status: DC

## 2016-09-13 MED ORDER — SENNA 8.6 MG PO TABS: 1.0000 | ORAL_TABLET | Freq: Two times a day (BID) | ORAL | 0 refills | Status: AC

## 2016-09-13 MED ORDER — POLYETHYLENE GLYCOL 3350 17 G PO PACK
17.0000 g | PACK | Freq: Every day | ORAL | Status: DC
  Administered 2016-09-14: 17 g via ORAL
  Filled 2016-09-13 (×2): qty 1

## 2016-09-13 MED ORDER — POLYETHYLENE GLYCOL 3350 17 G PO PACK: 17.0000 g | PACK | Freq: Every day | ORAL | 0 refills | Status: AC

## 2016-09-13 MED ORDER — BISACODYL 10 MG RE SUPP
10.0000 mg | Freq: Once | RECTAL | Status: AC
  Administered 2016-09-13: 10 mg via RECTAL
  Filled 2016-09-13: qty 1

## 2016-09-13 NOTE — Progress Notes (Signed)
[facility] called to arrange transportation home. Family at bedside and aware. Discharge instructions explained to son and daughter who verbalized understanding. Printed prescription given to daughter. Patient now alert, sitting comfortably in bed and talking to her son at bedside. Denies pain & discomfort.

## 2016-09-13 NOTE — Care Management Note (Signed)
Case Management Note  Patient Details  Name: [name] MRN: [id number] Date of Birth: July 01, 1935  Subjective/Objective:  Per dtr -chose to continue w/[facility] services @ home-dtr will have private duty care services-dtr has asked dr for a note-stating custodial level services needed for insurance purposes(MD agreed). [place] office spoke to Corel-informed that dtr will take patient home w/[facility] services, d/c today-they DO NOT want residential hospice @ this time. Ambulance transp needed @ d/c-[facility] forms in shadow chart-Nurse will call [facility] when ready.No further CM needs.                  Action/Plan:d/c home w/[facility].   Expected Discharge Date:  09/13/16               Expected Discharge Plan:  Home w Hospice Care  In-House Referral:  Clinical Social Work  Discharge planning Services  CM Consult  Post Acute Care Choice:  Hospice (Active w/[facility]) Choice offered to:     DME Arranged:    DME Agency:  [facility]  HH Arranged:  RN [facility] Agency:  [facility] [place]  Status of Service:  Completed, signed off  If discussed at [employer], dates discussed:    Additional Comments:  Dessa Phi, RN 09/13/2016, 2:25 PM

## 2016-09-13 NOTE — Progress Notes (Signed)
Physical Therapy Treatment Patient Details Name: [name] MRN: [id number] DOB: 02-19-1935 Today's Date: 09/13/2016    History of Present Illness  82 y.o. 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  CT of the abdomen and pelvis showed  with significant progression of the underlying neoplasm with a large pelvic and abdominal mass with intratumoral hemorrhage as well as progression of both hepatic and pulmonary metastatic lesions    PT Comments    Pt's daughter present today and reports likely d/c home later.  Daughter concerned about pt transferring to [facility] so daughter educated and assisted with transfer of pt to recliner.  Recommended obtaining gait belt for easier transfers at home as daughter reports pt has "bad" shoulders.  Daughter's questions regarding safe mobility/transfers at home answered.  Daughter had some other questions regarding d/c, deferred to RN Investment banker, corporate notified).   Follow Up Recommendations  Supervision/Assistance - 24 hour     Equipment Recommendations  Wheelchair (measurements PT);Wheelchair cushion (measurements PT);Hospital bed;3in1 (PT)    Recommendations for Other Services       Precautions / Restrictions Precautions Precautions: Fall Restrictions Other Position/Activity Restrictions: WBAT R foot     Mobility  Bed Mobility Overal bed mobility: Needs Assistance Bed Mobility: Supine to Sit     Supine to sit: Min assist     General bed mobility comments: assist for trunk upright  Transfers Overall transfer level: Needs assistance Equipment used: Rolling walker (2 wheeled) Transfers: Sit to/from [employer] Transfers Sit to Stand: Min assist;+2  safety/equipment Stand pivot transfers: Min assist;+2 safety/equipment       General transfer comment: verbal cues for safe technique and daughter present, educated and assisted, pt performing TDWB status, assist for weakness and steadying  Ambulation/Gait                 Stairs            Wheelchair Mobility    Modified Rankin (Stroke Patients Only)       Balance Overall balance assessment: History of Falls                                  Cognition Arousal/Alertness:  (sleepy today) Behavior During Therapy: WFL for tasks assessed/performed Overall Cognitive Status: Within Functional Limits for tasks assessed                      Exercises      General Comments        Pertinent Vitals/Pain Pain Assessment: No/denies pain (denies pain today)    Home Living                      Prior Function            PT Goals (current goals can now be found in the care plan section) Progress towards PT goals: Progressing toward goals    Frequency    Min 3X/week      PT Plan Current plan remains appropriate    Co-evaluation             End of Session Equipment Utilized During Treatment: Gait belt  Activity Tolerance: Patient limited by fatigue Patient left: in chair;with family/visitor present     Time: [id number] PT Time Calculation (min) (ACUTE ONLY): 11 min  Charges:  $Therapeutic Activity: 8-22 mins                    G Codes:      Penda Venturi,KATHrine E Oct 10, 2016, 2:56 PM Carmelia Bake, PT, DPT 2016-10-10 Pager: [phone]

## 2016-09-13 NOTE — Progress Notes (Signed)
[facility] [place]-GIP RN Visit @ 09:15 AM °  °This is a covered, related GIP visit from 09/10/2016 to [facility] diagnosis of Leiomyosarcoma of Uterus, per Dr. Hertweck.  Patient was brought to ED after having 2 falls and worsening abdominal pain.  Patient was admitted for generalized weakness and further work-up of Rt foot pain. ° °Met with patient and daughter in room. Patient and daughter both verbalized their desire to return home with hospice care. Have requested hospital bed, OTB table, BSC, extended shower chair and transfer chair. Have requested equipment with Jewell Hughes at [facility].  ° °Patient has received 2 doses of 25 mcg Fentanyl IV and 2 doses Oxycodone 10 mg oral in the last 24 hours per chart review.  ° °Daughter and patient both concerned about patient's right ankle, awaiting further follow up from MD. Also concern for patient without bowel movement since last Thursday 09/07/16. Daughter expresses that she would really like for pt to be moving bowels and transferring from bed to bedside commode prior to going home. ° °Please call with hospice related questions or concerns. ° °Thank you, °Tracy Ennis, RN °[facility] Hospital Liaison °[phone] ° °All hospital liaisons are now on AMION. °

## 2016-09-13 NOTE — Discharge Summary (Signed)
Physician Discharge Summary  Anahia Kulinski [facility] [id number] DOB: Jul 23, 1935 DOA: 09/10/2016  PCP: Marton Redwood, MD  Admit date: 09/10/2016 Discharge date: 09/15/2016  Admitted From: Home Disposition: Home   Recommendations for Outpatient Follow-up:  1. Follow up with PCP in 1-2 weeks 2. Please obtain BMP/CBC in one week 3. Please follow up on the following pending results:  Home Health: Hospice Equipment/Devices: Per Hospice Discharge Condition: Stable with guarded prognosis CODE STATUS: DNR Diet recommendation: Regular  Brief/Interim Summary: [name] is an 82 y.o. 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 She has been significantly weak at home, CT scan of the abdomen and pelvis was done that showed a significant progression of her underlying neoplasm of the large pelvic mass and abdominal mass with intratumoral hemorrhage as well as progression of her both hepatic and pulmonary metastases with a new pancreatic lesion, there is also mild fullness of the collecting system in the kidneys bilaterally probably due to extrinsic compression. She was evaluated by PT and thought most benefit by returning home with hospice care. Her hemoglobin remained stable and abdominal pain was controlled with oxycodone, prescribed at discharge. Hospice care will resume at discharge.   Discharge Diagnoses:  Principal Problem:   Generalized weakness Active Problems:   Anemia of chronic disease   Pulmonary embolism (HCC)   Seizures (HCC)   Right foot pain   Abdominal pain  Leiomyosarcoma of the liver with mets and abdominal pain: Her abdominal pain is likely due to her abdominal mass with intratumoral hemorrhage.  - oxycodone 5mg  as needed for breakthrough pain. A long-acting version of  this was started in the hospital but caused severe somnolence, so this was stopped.  - senna and miralax - Will receive hospice services at discharge.   AKI: Improved, discharge Cr 1.58. Recommend recheck at follow up. - STOP taking lisinopril - HCTZ until follow up  Generalized weakness/falls and right foot pain: - MRI of the ankle showed a sprain with a mild joint effusion.  Anemia of chronic disease: Hemoglobin initially dropped presumably due to bleeding into mass, but remained stable 8.1 > 7.3 > 7.9 without transfusion support.  Pulmonary embolism [facility]): Her INR has been reversed  - After discussion of risk and benefits, she has chosen to be off Coumadin. - She relates that hospice [facility] had talked to her about a week or 2 prior to admission to take her off Coumadin. She says she has thought about it and would like to be taken off the Coumadin.  Seizures (HCC) Lamictal continue current regimen.  Discharge Instructions Discharge Instructions    Discharge instructions    Complete by:  As directed    You were admitted with bleeding into the tumor in the liver which is now stable. You are clear for discharge with the following recommendations:  - STOP taking warfarin (coumadin) - STOP taking lisinopril - HCTZ until you follow up with your doctor - Use an ACE wrap or brace on the right ankle sprain. The MRI of the ankle was negative for fracture.  - Take pain medications as directed: oxycodone 5mg  as needed for breakthrough pain. A long-acting version of this was started in the hospital but caused severe somnolence, so this was stopped.  - Take xanax ONLY as needed, as  this is a dangerous medication for your age range.  - For constipation, you should try to limit narcotic pain medications and take senna and miralax as directed. These have been sent to your pharmacy.  - Follow up with home hospice services. - If you feel acutely worse or faint or chest pain or trouble breathing,  please seek medical care.     Allergies as of 09/14/2016      Reactions   Iohexol Other (See Comments)   Code: RASH, Desc: REMOTE H/O LIP TINGLING, NUMBNESS, AND HIVES WITH IV CM- 13 HR PRE-MED GIVEN AND PATIENT TOLERATED WELL- LY, Onset Date: [id number]   Morphine And Related Other (See Comments)   Reaction:  Cardiac arrest    Sulfonamide Derivatives Swelling, Other (See Comments)   Reaction:  Unspecified swelling reaction       Medication List    STOP taking these medications   lisinopril-hydrochlorothiazide 20-12.5 MG tablet Commonly known as:  PRINZIDE,ZESTORETIC   warfarin 2.5 MG tablet Commonly known as:  COUMADIN     TAKE these medications   ALPRAZolam 0.5 MG tablet Commonly known as:  XANAX Take 1 tablet (0.5 mg total) by mouth 2 (two) times daily as needed for anxiety. What changed:  when to take this  reasons to take this   DULoxetine 60 MG capsule Commonly known as:  CYMBALTA Take 60 mg by mouth at bedtime.   lamoTRIgine 100 MG tablet Commonly known as:  LAMICTAL Take 100 mg by mouth 2 (two) times daily.   metoprolol tartrate 25 MG tablet Commonly known as:  LOPRESSOR Take 25 mg by mouth 2 (two) times daily.   ondansetron 4 MG disintegrating tablet Commonly known as:  ZOFRAN-ODT Take 1 tablet (4 mg total) by mouth every 8 (eight) hours as needed for nausea or vomiting.   oxyCODONE 5 MG immediate release tablet Commonly known as:  Oxy IR/ROXICODONE Take 1 tablet (5 mg total) by mouth every 3 (three) hours as needed for moderate pain.   polyethylene glycol packet Commonly known as:  MIRALAX / GLYCOLAX Take 17 g by mouth daily.   rivastigmine 4.6 mg/24hr Commonly known as:  EXELON Place 4.6 mg onto the skin at bedtime. Pt applies to left upper back.   senna 8.6 MG Tabs tablet Commonly known as:  SENOKOT Take 1 tablet (8.6 mg total) by mouth 2 (two) times daily.      Follow-up Information    Marton Redwood, MD Follow up.   Specialty:  Internal  Medicine Contact information: [address] [place] [address] [phone]        [facility] Follow up.   Specialty:  Hospice and Palliative Medicine Why:  home nurse. Contact information: [place] [address] [phone]          Allergies  Allergen Reactions  . Iohexol Other (See Comments)    Code: RASH, Desc: REMOTE H/O LIP TINGLING, NUMBNESS, AND HIVES WITH IV CM- 13 HR PRE-MED GIVEN AND PATIENT TOLERATED WELL- LY, Onset Date: [id number]   . Morphine And Related Other (See Comments)    Reaction:  Cardiac arrest   . Sulfonamide Derivatives Swelling and Other (See Comments)    Reaction:  Unspecified swelling reaction     Consultations:  [facility]  Procedures/Studies: Ct Abdomen Pelvis Wo Contrast  Result Date: 09/11/2016 CLINICAL DATA:  History of multiple falls over the past few days EXAM: CT ABDOMEN AND PELVIS WITHOUT CONTRAST TECHNIQUE: Multidetector CT imaging of  the abdomen and pelvis was performed following the standard protocol without IV contrast. COMPARISON:  03/21/2016 FINDINGS: Lower chest: Lung bases demonstrate multiple bilateral pulmonary metastatic lesions. The largest of these in the right lower lobe posteriorly measures approximately 4.8 cm. This is an increase from the prior exam a 3.1 cm consistent with progressive metastatic disease. Hepatobiliary: Diffuse hepatic metastatic disease is again identified. The largest of these lesions in the right hepatic lobe measures roughly 4.0 cm increased from 2.8 cm on the prior study. The gallbladder has been surgically removed. Pancreas: Pancreas is well visualized. Adjacent to the inferior aspect of the pancreatic body there is a 2.2 cm soft tissue prominence which was not as well appreciated on the prior exam. This may represent some focal metastatic disease as well. Spleen: Multiple splenic granulomas are noted. Adrenals/Urinary Tract: The adrenal  glands are within normal limits. Mild hydronephrosis is noted bilaterally likely related to distal ureteral compression from the known abdominal mass lesion. Bladder is partially distended. Stomach/Bowel: No obstructive or inflammatory changes are seen. Diverticulosis is noted without diverticulitis. The appendix is not well appreciated. Postsurgical changes are noted consistent with the prior hiatal hernia surgery. Recurrent hiatal hernia is noted Vascular/Lymphatic: Aortic atherosclerosis. No enlarged abdominal or pelvic lymph nodes. Reproductive: The uterus is obscured by a large somewhat bilobed mass density which measures approximately 20 cm in transverse dimension. It measures at least 12 cm in greatest AP dimension and extends for approximately 23 cm in the craniocaudad projection. This is most consistent with progression of the patient's known leiomyosarcoma. Areas of increased density are noted within with some layering likely related to the intratumoral hemorrhage. A small 2.7 cm soft tissue nodule is noted in the pelvis adjacent to the rectum. Other: Minimal free pelvic fluid is noted. Musculoskeletal: Degenerative changes of the lumbar spine are noted. No definitive bony metastatic lesions are seen. IMPRESSION: Changes consistent with significant progression of the underlying neoplasm with a large pelvic and abdominal mass with intratumoral hemorrhage as well as progression of both hepatic and pulmonary metastatic lesions. New pancreatic lesion likely representing a metastatic focus with second primary could not be totally excluded. Mild fullness of the collecting systems of the kidneys bilaterally likely related to extrinsic compression on the distal ureters. These results will be called to the ordering clinician or representative by the Radiologist Assistant, and communication documented in the PACS or zVision Dashboard. Electronically Signed   By: Inez Catalina M.D.   On: 09/11/2016 08:54   Dg Chest  2 View  Result Date: 09/10/2016 CLINICAL DATA:  Weakness.  Cancer. EXAM: CHEST  2 VIEW COMPARISON:  12/12/2015 chest radiograph. FINDINGS: Surgical clips overlie the left axilla. Stable cardiomediastinal silhouette with mild cardiomegaly and aortic atherosclerosis. No pneumothorax. No pleural effusion. There are multiple scattered pulmonary nodules throughout both lungs, largest in the mid to lower right lung, which appear increased in size since 12/12/2015 chest radiograph. No pulmonary edema. No acute consolidative airspace disease. IMPRESSION: 1. Interval growth of scattered bilateral pulmonary nodules compatible with progression of pulmonary metastases. 2. Stable mild cardiomegaly without overt pulmonary edema . 3. Aortic atherosclerosis. Electronically Signed   By: Ilona Sorrel M.D.   On: 09/10/2016 21:50   Dg Ankle Complete Right  Result Date: 09/10/2016 CLINICAL DATA:  Multiple falls this week. Right ankle pain and swelling, most prominent laterally. EXAM: RIGHT ANKLE - COMPLETE 3+ VIEW COMPARISON:  None. FINDINGS: No fracture or subluxation. No suspicious focal osseous lesion. Small Achilles right calcaneal spur. No  radiopaque foreign body. IMPRESSION: No fracture or subluxation. Electronically Signed   By: Ilona Sorrel M.D.   On: 09/10/2016 18:52   Ct Head Wo Contrast  Result Date: 09/10/2016 CLINICAL DATA:  Recent fall EXAM: CT HEAD WITHOUT CONTRAST TECHNIQUE: Contiguous axial images were obtained from the base of the skull through the vertex without intravenous contrast. COMPARISON:  05/12/2016 FINDINGS: Brain: Mild atrophic changes are noted. Chronic white matter ischemic change is seen as well. These findings are stable from the prior exam. No findings to suggest acute hemorrhage, acute infarction or space-occupying mass lesion are noted. Vascular: No hyperdense vessel or unexpected calcification. Skull: Normal. Negative for fracture or focal lesion. Sinuses/Orbits: No acute finding. Other: None.  IMPRESSION: Chronic atrophic and ischemic changes. Electronically Signed   By: Inez Catalina M.D.   On: 09/10/2016 19:52   Mr Ankle Right Wo Contrast  Result Date: 09/11/2016 CLINICAL DATA:  Severe ankle pain due to multiple falls over the last 3 days. Metastatic leiomyosarcoma. EXAM: MRI OF THE RIGHT ANKLE WITHOUT CONTRAST TECHNIQUE: Multiplanar, multisequence MR imaging of the ankle was performed. No intravenous contrast was administered. COMPARISON:  Radiographs dated 09/10/2016 FINDINGS: TENDONS Peroneal: Intact peroneus longus and peroneus brevis tendons. Posteromedial: Intact tibialis posterior, flexor hallucis longus and flexor digitorum longus tendons. Anterior: Intact tibialis anterior, extensor hallucis longus and extensor digitorum longus tendons. Achilles: Intact. Plantar Fascia: Intact. LIGAMENTS Lateral: Sprain of the anterior talofibular ligament. The ligament is intact. Posterior talofibular ligament and calcaneofibular ligament are intact. Edema in the adjacent soft tissues. Medial: Intact. CARTILAGE Ankle Joint: Small ankle effusion. Subtalar Joints/Sinus Tarsi: Small subtalar joint effusion. Bones: No marrow signal abnormality. No fracture or dislocation. Other: No fluid collection or hematoma IMPRESSION: Slight sprain of the anterior talofibular ligament with edema in the adjacent soft tissues. Small ankle and subtalar joint effusions, nonspecific. Electronically Signed   By: Lorriane Shire M.D.   On: 09/11/2016 09:11   Dg Abd Portable 1v  Result Date: 09/13/2016 CLINICAL DATA:  Constipation today, history hypertension, stroke, peptic ulcer disease, coronary artery disease, hiatal hernia, GERD, Alzheimer's EXAM: PORTABLE ABDOMEN - 1 VIEW COMPARISON:  Portable exam 1301 hours compared to CT of 09/11/2016 FINDINGS: Mild gas within stomach. Nonobstructive bowel gas pattern. Scattered gas and stool throughout colon to rectum. No bowel dilatation or bowel wall thickening. Diffuse osseous  demineralization with degenerative changes at lower lumbar spine. Aortic atherosclerosis. IMPRESSION: Nonobstructive bowel gas pattern. Electronically Signed   By: Lavonia Dana M.D.   On: 09/13/2016 13:30    Subjective: Pt feels better, pain controlled. Right ankle still tender but she can bear limited weight with transfers. No chest pain or dyspnea.   Discharge Exam: Vitals:   09/13/16 2048 09/14/16 0619  BP: (!) 132/57 (!) 111/51  Pulse: 98 82  Resp: 16 16  Temp: 98.6 F (37 C) 99.1 F (37.3 C)   General: Pt is alert, awake, not in acute distress Cardiovascular: RRR, S1/S2 +, no rubs, no gallops Respiratory: CTA bilaterally, no wheezing, no rhonchi Abdominal: Abdomen is distended, soft and diffuse tenderness no rebound or guarding. Extremities: No edema, no cyanosis. Right ankle turned inward but with full passive ROM and no instability. NVI distally.   The results of significant diagnostics from this hospitalization (including imaging, microbiology, ancillary and laboratory) are listed below for reference.    Labs: Basic Metabolic Panel:  Recent Labs Lab 09/10/16 1841 09/11/16 0232  NA 135 136  K 4.4 4.1  CL 102 104  CO2 25  22  GLUCOSE 125* 118*  BUN 23* 21*  CREATININE 1.61* 1.58*  CALCIUM 8.7* 8.5*   Liver Function Tests:  Recent Labs Lab 09/10/16 1841 09/11/16 0232  AST 54* 48*  ALT 35 30  ALKPHOS 164* 140*  BILITOT 0.5 0.5  PROT 7.3 6.9  ALBUMIN 3.4* 3.2*   CBC:  Recent Labs Lab 09/10/16 1841 09/11/16 0232 09/12/16 0831  WBC 13.3* 11.1* 15.0*  NEUTROABS 10.2* 7.7  --   HGB 8.1* 7.3* 7.9*  HCT 25.7* 22.7* 24.2*  MCV 87.4 87.3 90.0  PLT 510* 445* 468*   Urinalysis    Component Value Date/Time   COLORURINE YELLOW 09/10/2016 2043   APPEARANCEUR CLEAR 09/10/2016 2043   LABSPEC 1.017 09/10/2016 2043   PHURINE 5.0 09/10/2016 2043   [place] 09/10/2016 2043   [place] NEGATIVE 09/10/2016 2043   [place] NEGATIVE 09/10/2016 2043    [place] NEGATIVE 09/10/2016 2043   PROTEINUR NEGATIVE 09/10/2016 2043   UROBILINOGEN 0.2 12/08/2008 1343   NITRITE NEGATIVE 09/10/2016 2043   LEUKOCYTESUR NEGATIVE 09/10/2016 2043    Time coordinating discharge: Approximately 40 minutes  Vance Gather, MD  [facility] 09/15/2016, 2:27 PM Pager [phone]

## 2016-09-14 NOTE — Care Management Note (Signed)
Case Management Note  Patient Details  Name: [name] MRN: [id number] Date of Birth: [birth year]  Subjective/Objective: d/c home today by [facility] [id number] for p/u(Hospice related admission, already on hospice service prior to admission-all forms in shadow chart(DNR,face sheet,EMS form) Nsg aware. Confirmed address.                    Action/Plan:d/c home w/[facility] by [employer].   Expected Discharge Date:  09/14/16               Expected Discharge Plan:  Home w Hospice Care  In-House Referral:  Clinical Social Work  Discharge planning Services  CM Consult  Post Acute Care Choice:  Hospice (Active w/[facility]) Choice offered to:     DME Arranged:    DME Agency:  [facility]  HH Arranged:  RN [facility] Agency:  [facility]  Status of Service:  Completed, signed off  If discussed at [employer] [facility] [employer], dates discussed:    Additional Comments:  Dessa Phi, RN 09/14/2016, 12:41 PM

## 2016-09-14 NOTE — Progress Notes (Addendum)
[place] with [place], RN, [facility] came to pick up patient last night and patient started having chest pains and [facility] would not transport home with active chest pain.   Per Marolyn Hammock, everything came back okay with the EKG.  Patient did have some drop in O2 sats also.  All symptoms are resolved this morning and it is anticipated that patient will go home today.   Katelyn to touch base with MD and get patient home.  Thank you,   Edyth Gunnels, RN, [facility] Liaison [phone]  All hospital liaison's are now on  [place].   Please feel free to contact me at the above number with questions or concerns or call hospice at [phone] after 5pm.

## 2016-09-14 NOTE — Progress Notes (Signed)
[place] with Marolyn Hammock, RN again.  Could not reach [place], [facility].   I advised Katelyn that [facility] services were used for those not already on service with [place] and to get [facility] to pick up patient.     Thank you,  Edyth Gunnels, RN, [facility] Liaison [phone]  All hospital liaison's are now on [place].  Please feel free to contact me at the above number or call hospice at [phone] after 5pm.

## 2016-10-05 DEATH — deceased
